# Patient Record
Sex: Female | Born: 1955 | Race: White | Hispanic: No | Smoking: Current every day smoker
Health system: Southern US, Community
[De-identification: ages and names within clinical notes are randomized; demographics above are authoritative.]

## PROBLEM LIST (undated history)

## (undated) DIAGNOSIS — J449 Chronic obstructive pulmonary disease, unspecified: Secondary | ICD-10-CM

## (undated) DIAGNOSIS — J439 Emphysema, unspecified: Secondary | ICD-10-CM

## (undated) DIAGNOSIS — M797 Fibromyalgia: Secondary | ICD-10-CM

## (undated) DIAGNOSIS — F329 Major depressive disorder, single episode, unspecified: Secondary | ICD-10-CM

## (undated) DIAGNOSIS — R509 Fever, unspecified: Secondary | ICD-10-CM

## (undated) DIAGNOSIS — I639 Cerebral infarction, unspecified: Secondary | ICD-10-CM

## (undated) DIAGNOSIS — E119 Type 2 diabetes mellitus without complications: Secondary | ICD-10-CM

## (undated) DIAGNOSIS — R011 Cardiac murmur, unspecified: Secondary | ICD-10-CM

## (undated) DIAGNOSIS — F32A Depression, unspecified: Secondary | ICD-10-CM

## (undated) DIAGNOSIS — M791 Myalgia, unspecified site: Secondary | ICD-10-CM

## (undated) DIAGNOSIS — I10 Essential (primary) hypertension: Secondary | ICD-10-CM

## (undated) DIAGNOSIS — E782 Mixed hyperlipidemia: Secondary | ICD-10-CM

## (undated) DIAGNOSIS — I350 Nonrheumatic aortic (valve) stenosis: Secondary | ICD-10-CM

## (undated) DIAGNOSIS — J45909 Unspecified asthma, uncomplicated: Secondary | ICD-10-CM

## (undated) DIAGNOSIS — E039 Hypothyroidism, unspecified: Secondary | ICD-10-CM

## (undated) DIAGNOSIS — R609 Edema, unspecified: Secondary | ICD-10-CM

## (undated) DIAGNOSIS — R5383 Other fatigue: Secondary | ICD-10-CM

## (undated) DIAGNOSIS — R42 Dizziness and giddiness: Secondary | ICD-10-CM

## (undated) DIAGNOSIS — F419 Anxiety disorder, unspecified: Secondary | ICD-10-CM

## (undated) DIAGNOSIS — I2510 Atherosclerotic heart disease of native coronary artery without angina pectoris: Secondary | ICD-10-CM

## (undated) DIAGNOSIS — R21 Rash and other nonspecific skin eruption: Secondary | ICD-10-CM

## (undated) DIAGNOSIS — R079 Chest pain, unspecified: Secondary | ICD-10-CM

## (undated) HISTORY — DX: Cardiac murmur, unspecified: R01.1

## (undated) HISTORY — DX: Mixed hyperlipidemia: E78.2

## (undated) HISTORY — DX: Major depressive disorder, single episode, unspecified: F32.9

## (undated) HISTORY — DX: Myalgia, unspecified site: M79.10

## (undated) HISTORY — DX: Anxiety disorder, unspecified: F41.9

## (undated) HISTORY — DX: Rash and other nonspecific skin eruption: R21

## (undated) HISTORY — DX: Type 2 diabetes mellitus without complications: E11.9

## (undated) HISTORY — DX: Depression, unspecified: F32.A

## (undated) HISTORY — DX: Chest pain, unspecified: R07.9

## (undated) HISTORY — DX: Cerebral infarction, unspecified: I63.9

## (undated) HISTORY — DX: Dizziness and giddiness: R42

## (undated) HISTORY — DX: Essential (primary) hypertension: I10

## (undated) HISTORY — DX: Atherosclerotic heart disease of native coronary artery without angina pectoris: I25.10

## (undated) HISTORY — DX: Fibromyalgia: M79.7

## (undated) HISTORY — DX: Emphysema, unspecified: J43.9

## (undated) HISTORY — DX: Nonrheumatic aortic (valve) stenosis: I35.0

## (undated) HISTORY — DX: Fever, unspecified: R50.9

## (undated) HISTORY — DX: Unspecified asthma, uncomplicated: J45.909

## (undated) HISTORY — DX: Chronic obstructive pulmonary disease, unspecified: J44.9

## (undated) HISTORY — DX: Other fatigue: R53.83

## (undated) HISTORY — DX: Edema, unspecified: R60.9

## (undated) HISTORY — PX: CHOLECYSTECTOMY: SHX55

---

## 1998-11-24 ENCOUNTER — Encounter: Payer: Self-pay | Admitting: Emergency Medicine

## 1998-11-24 ENCOUNTER — Emergency Department (HOSPITAL_COMMUNITY): Admission: EM | Admit: 1998-11-24 | Discharge: 1998-11-24 | Payer: Self-pay | Admitting: Emergency Medicine

## 1998-12-11 ENCOUNTER — Encounter (HOSPITAL_BASED_OUTPATIENT_CLINIC_OR_DEPARTMENT_OTHER): Payer: Self-pay | Admitting: General Surgery

## 1998-12-11 ENCOUNTER — Inpatient Hospital Stay (HOSPITAL_COMMUNITY): Admission: RE | Admit: 1998-12-11 | Discharge: 1998-12-12 | Payer: Self-pay | Admitting: General Surgery

## 1999-04-05 ENCOUNTER — Other Ambulatory Visit: Admission: RE | Admit: 1999-04-05 | Discharge: 1999-04-05 | Payer: Self-pay | Admitting: *Deleted

## 1999-04-05 ENCOUNTER — Encounter: Admission: RE | Admit: 1999-04-05 | Discharge: 1999-04-05 | Payer: Self-pay | Admitting: Obstetrics

## 2000-04-10 ENCOUNTER — Other Ambulatory Visit: Admission: RE | Admit: 2000-04-10 | Discharge: 2000-04-10 | Payer: Self-pay | Admitting: *Deleted

## 2003-12-28 ENCOUNTER — Encounter: Admission: RE | Admit: 2003-12-28 | Discharge: 2003-12-28 | Payer: Self-pay | Admitting: Internal Medicine

## 2004-11-05 ENCOUNTER — Emergency Department (HOSPITAL_COMMUNITY): Admission: EM | Admit: 2004-11-05 | Discharge: 2004-11-05 | Payer: Self-pay | Admitting: Emergency Medicine

## 2005-04-24 ENCOUNTER — Encounter: Admission: RE | Admit: 2005-04-24 | Discharge: 2005-04-24 | Payer: Self-pay | Admitting: Family Medicine

## 2010-01-21 DIAGNOSIS — G4730 Sleep apnea, unspecified: Secondary | ICD-10-CM

## 2010-01-21 HISTORY — DX: Sleep apnea, unspecified: G47.30

## 2010-02-10 ENCOUNTER — Encounter: Payer: Self-pay | Admitting: Family Medicine

## 2010-02-11 ENCOUNTER — Encounter: Payer: Self-pay | Admitting: Internal Medicine

## 2010-06-25 ENCOUNTER — Other Ambulatory Visit: Payer: Self-pay | Admitting: Orthopaedic Surgery

## 2010-06-25 DIAGNOSIS — M48061 Spinal stenosis, lumbar region without neurogenic claudication: Secondary | ICD-10-CM

## 2010-07-02 ENCOUNTER — Other Ambulatory Visit: Payer: Self-pay

## 2013-06-10 ENCOUNTER — Ambulatory Visit (INDEPENDENT_AMBULATORY_CARE_PROVIDER_SITE_OTHER): Payer: Medicare Other

## 2013-06-10 VITALS — BP 153/75 | HR 65 | Resp 18

## 2013-06-10 DIAGNOSIS — M6788 Other specified disorders of synovium and tendon, other site: Secondary | ICD-10-CM

## 2013-06-10 DIAGNOSIS — M722 Plantar fascial fibromatosis: Secondary | ICD-10-CM

## 2013-06-10 DIAGNOSIS — M7730 Calcaneal spur, unspecified foot: Secondary | ICD-10-CM

## 2013-06-10 DIAGNOSIS — G5790 Unspecified mononeuropathy of unspecified lower limb: Secondary | ICD-10-CM

## 2013-06-10 DIAGNOSIS — M7660 Achilles tendinitis, unspecified leg: Secondary | ICD-10-CM

## 2013-06-10 NOTE — Progress Notes (Signed)
   Subjective:    Patient ID: Kristina Montgomery, female    DOB: 1955/04/21, 58 y.o.   MRN: [id number]  HPI my right heel has been hurting for about two months and burns and hurts on the bottom and feels like a stone bruise and throbs and sharp pains and hurts first thing in the morning    Review of Systems  Constitutional: Positive for fever, chills, activity change, appetite change and fatigue.  HENT:       Ringing in ears  Eyes: Positive for visual disturbance.  Respiratory: Positive for cough, chest tightness and wheezing.   Gastrointestinal: Positive for nausea, abdominal pain and diarrhea.  Genitourinary: Positive for urgency and difficulty urinating.  Musculoskeletal: Positive for back pain and gait problem.  Skin: Positive for rash.       Rash on foot  Neurological: Positive for seizures, weakness and numbness.  Psychiatric/Behavioral: The patient is nervous/anxious.   All other systems reviewed and are negative.      Objective:   Physical Exam Lower extremity objective findings as follows vascular status reveals pedal pulses palpable DP postal for PT plus one over 4 bilateral capillary refill time 3 seconds all digits skin temperature warm turgor normal no edema rubor pallor noted mild varicosities noted bilateral neurologically epicritic and proprioceptive sensations intact and symmetric bilateral patient is having some burning paresthesias his meals and and shooting sensations in the inferior heel and arch area heel heel and posterior heel on the right patient also has had lumbar radiculopathy and back surgeries in the past. Is on pain management medications for chronic back problems. The slight fissuring some symptomology and neuritis neuralgia to the inferior and posterior heel most significant pain is the posterior aspect of the heel on direct palpation the retrocalcaneal tubercle x-rays confirm large posterior calcaneal spur thickening Achilles tendon mild thickening plantar  fascial structures well to some tenderness the fascia although not as severe. Orthopedic exam otherwise unremarkable noncontributory. There no open wounds ulcerations dermatologically otherwise unremarkable       Assessment & Plan:  Assessment this time is retrocalcaneal spurring with Achilles tendinosis versus bursitis the Achilles tendon and tendinitis there is some abnormality gait patient also has posse some lumbar radiculopathy with neuritis or neuralgia effecting her heel in right foot. Patient is RE taking multiple medications for nerve and pain and not feeling additional pain medication is necessary this time recommended therapy including warm compress ice pack to maintain hot and cold therapy to the posterior heel area multiple episodes daily as instructed also this time dispensed instructed patient to use of an air fracture walker boot to immobilize the Achilles tendon heel and ankle area continued boot for at least 4 weeks reappointed that time for followup and reevaluation if no significant root may consider alternative treatments possible physical therapy is noninvasive further studies may be considered or invasive options as a last resort to  Alvan Dameichard Dequane Strahan DPM

## 2013-06-10 NOTE — Patient Instructions (Signed)
ICE INSTRUCTIONS  Apply ice or cold pack to the affected area at least 3 times a day for 10-15 minutes each time.  You should also use ice after prolonged activity or vigorous exercise.  Do not apply ice longer than 20 minutes at one time.  Always keep a cloth between your skin and the ice pack to prevent burns.  Being consistent and following these instructions will help control your symptoms.  We suggest you purchase a gel ice pack because they are reusable and do bit leak.  Some of them are designed to wrap around the area.  Use the method that works best for you.  Here are some other suggestions for icing.   Use a frozen bag of peas or corn-inexpensive and molds well to your body, usually stays frozen for 10 to 20 minutes.  Wet a towel with cold water and squeeze out the excess until it's damp.  Place in a bag in the freezer for 20 minutes. Then remove and use.  Alternate applying hot compresses such as a hot or warm moist towel for 10 minutes then ice pack for 10 minutes repeat this 2 or 3 times every day applying heat ice heat and ice multiple times in each sitting. Next  Maintain air fracture boot at all times except when bathing or sleeping

## 2013-07-08 ENCOUNTER — Ambulatory Visit: Payer: Medicare Other

## 2015-03-21 DIAGNOSIS — E049 Nontoxic goiter, unspecified: Secondary | ICD-10-CM | POA: Insufficient documentation

## 2016-01-22 DIAGNOSIS — E0590 Thyrotoxicosis, unspecified without thyrotoxic crisis or storm: Secondary | ICD-10-CM

## 2016-01-22 HISTORY — DX: Thyrotoxicosis, unspecified without thyrotoxic crisis or storm: E05.90

## 2017-06-23 ENCOUNTER — Ambulatory Visit (INDEPENDENT_AMBULATORY_CARE_PROVIDER_SITE_OTHER): Payer: Medicare Other | Admitting: Podiatry

## 2017-06-23 ENCOUNTER — Encounter: Payer: Self-pay | Admitting: Podiatry

## 2017-06-23 ENCOUNTER — Other Ambulatory Visit: Payer: Self-pay | Admitting: *Deleted

## 2017-06-23 VITALS — BP 114/50 | HR 68

## 2017-06-23 DIAGNOSIS — B351 Tinea unguium: Secondary | ICD-10-CM | POA: Diagnosis not present

## 2017-06-23 DIAGNOSIS — E1142 Type 2 diabetes mellitus with diabetic polyneuropathy: Secondary | ICD-10-CM

## 2017-07-20 NOTE — Progress Notes (Signed)
Subjective:  Patient ID: Kristina MoatsBrenda W Clinkenbeard, female    DOB: 1955/06/30,  MRN: [id number]  Chief Complaint  Patient presents with  . Nail Problem    debridement,unable to trim  Reports diabetes 62 y.o. female presents with the above complaint.  With monitoring Dr. sugar level.  Last A1c of 13.  Reports her diabetes out-of-control reports history of ingrown's in the past and states her nails are very painful. Past Medical History:  Diagnosis Date  . Anxiety   . Asthma   . Chest pain   . Depression   . Diabetes mellitus without complication (HCC)   . Dizziness   . Emphysema of lung (HCC)   . Fatigue   . Fever   . Hypertension   . Muscle pain   . Rash    foot  . Swelling    Past Surgical History:  Procedure Laterality Date  . CHOLECYSTECTOMY      Current Outpatient Medications:  .  Aclidinium Bromide (TUDORZA PRESSAIR) 400 MCG/ACT AEPB, Inhale into the lungs., Disp: , Rfl:  .  albuterol (PROVENTIL HFA;VENTOLIN HFA) 108 (90 BASE) MCG/ACT inhaler, Inhale into the lungs every 6 (six) hours as needed for wheezing or shortness of breath., Disp: , Rfl:  .  Alogliptin-Pioglitazone (OSENI PO), Take 25 mg by mouth., Disp: , Rfl:  .  ALPRAZolam (XANAX) 1 MG tablet, Take 1 mg by mouth at bedtime as needed for anxiety., Disp: , Rfl:  .  amLODipine-olmesartan (AZOR) 10-40 MG per tablet, Take 1 tablet by mouth daily., Disp: , Rfl:  .  ARIPiprazole (ABILIFY) 10 MG tablet, TK 1 T PO  D, Disp: , Rfl: 0 .  carisoprodol (SOMA) 350 MG tablet, Take 350 mg by mouth 4 (four) times daily as needed for muscle spasms., Disp: , Rfl:  .  doxepin (SINEQUAN) 100 MG capsule, TK 2 CS PO QHS, Disp: , Rfl: 2 .  DULoxetine (CYMBALTA) 30 MG capsule, TK ONE C PO D, Disp: , Rfl: 2 .  estradiol (ESTRACE) 0.5 MG tablet, Take 0.5 mg by mouth daily., Disp: , Rfl:  .  FLUoxetine (PROZAC) 20 MG tablet, Take 20 mg by mouth daily., Disp: , Rfl:  .  methimazole (TAPAZOLE) 5 MG tablet, TK 1 T PO D, Disp: , Rfl: 0 .  nebivolol  (BYSTOLIC) 10 MG tablet, Take 10 mg by mouth daily., Disp: , Rfl:  .  OxyCODONE HCl ER (OXYCONTIN) 30 MG T12A, Take by mouth., Disp: , Rfl:  .  oxyCODONE-acetaminophen (PERCOCET) 10-325 MG per tablet, Take 1 tablet by mouth every 4 (four) hours as needed for pain. Patient states that it is the 10 mg/lc, Disp: , Rfl:  .  topiramate (TOPAMAX) 50 MG tablet, Take 50 mg by mouth 2 (two) times daily., Disp: , Rfl:   No Known Allergies Review of Systems: Negative except as noted in the HPI. Denies N/V/F/Ch. Objective:   General AA&O x3. Normal mood and affect.  Vascular Dorsalis pedis pulses present 1+ bilaterally  Posterior tibial pulses 1+ bilaterally  Capillary refill normal to all digits. Pedal hair growth normal.  Neurologic Epicritic sensation present bilaterally. Protective sensation with 5.07 monofilament  Absent bilaterally. Vibratory sensation present bilaterally.  Dermatologic No open lesions. Interspaces clear of maceration.  Normal skin temperature and turgor. Hyperkeratotic lesions: None bilaterally. Nails: brittle, onychomycosis, thickening, elongation  Orthopedic: No history of amputation. MMT 5/5 in dorsiflexion, plantarflexion, inversion, and eversion. Normal lower extremity joint ROM without pain or crepitus.    Assessment & Plan:  Patient was evaluated and treated and all questions answered.  Diabetes with Neuropathy, Onychomycosis -Educated on diabetic footcare. Diabetic risk level 1 -Nails x10 debrided sharply and manually with large nail nipper and rotary burr.  Return in about 3 months (around 09/23/2017) for Diabetic Foot Care.

## 2017-08-25 ENCOUNTER — Ambulatory Visit: Payer: Medicare Other | Admitting: Podiatry

## 2020-04-28 ENCOUNTER — Other Ambulatory Visit: Payer: Self-pay | Admitting: *Deleted

## 2020-04-28 DIAGNOSIS — I350 Nonrheumatic aortic (valve) stenosis: Secondary | ICD-10-CM

## 2020-05-01 ENCOUNTER — Encounter: Payer: Self-pay | Admitting: Thoracic Surgery (Cardiothoracic Vascular Surgery)

## 2020-05-03 ENCOUNTER — Other Ambulatory Visit: Payer: Self-pay

## 2020-05-03 ENCOUNTER — Ambulatory Visit (HOSPITAL_COMMUNITY)
Admission: RE | Admit: 2020-05-03 | Discharge: 2020-05-03 | Disposition: A | Discharge status: Home / Self Care | Payer: Medicare Other | Source: Ambulatory Visit | Attending: Internal Medicine | Admitting: Internal Medicine

## 2020-05-03 DIAGNOSIS — I350 Nonrheumatic aortic (valve) stenosis: Secondary | ICD-10-CM | POA: Diagnosis present

## 2020-05-03 MED ORDER — PERFLUTREN LIPID MICROSPHERE
1.0000 mL | INTRAVENOUS | Status: AC | PRN
  Administered 2020-05-03: 3 mL via INTRAVENOUS
  Filled 2020-05-03: qty 10

## 2020-05-03 NOTE — Progress Notes (Signed)
  Echocardiogram 2D Echocardiogram has been performed.  Tye Savoy 05/03/2020, 10:48 AM

## 2020-05-05 LAB — ECHOCARDIOGRAM COMPLETE
AR max vel: 0.98 cm2
AV Area VTI: 1.07 cm2
AV Area mean vel: 0.97 cm2
AV Mean grad: 40 mmHg
AV Peak grad: 64.3 mmHg
Ao pk vel: 4.01 m/s
Area-P 1/2: 9.37 cm2
Calc EF: 49.1 %
MV VTI: 2.56 cm2
S' Lateral: 3.6 cm
Single Plane A2C EF: 44.4 %
Single Plane A4C EF: 52.4 %

## 2020-05-15 ENCOUNTER — Ambulatory Visit (INDEPENDENT_AMBULATORY_CARE_PROVIDER_SITE_OTHER): Payer: Medicare Other | Admitting: Internal Medicine

## 2020-05-15 ENCOUNTER — Encounter: Payer: Self-pay | Admitting: Internal Medicine

## 2020-05-15 ENCOUNTER — Other Ambulatory Visit: Payer: Self-pay

## 2020-05-15 VITALS — BP 124/72 | HR 113 | Ht 65.0 in | Wt 175.2 lb

## 2020-05-15 DIAGNOSIS — I10 Essential (primary) hypertension: Secondary | ICD-10-CM

## 2020-05-15 DIAGNOSIS — I350 Nonrheumatic aortic (valve) stenosis: Secondary | ICD-10-CM

## 2020-05-15 DIAGNOSIS — I4510 Unspecified right bundle-branch block: Secondary | ICD-10-CM | POA: Diagnosis not present

## 2020-05-15 DIAGNOSIS — E785 Hyperlipidemia, unspecified: Secondary | ICD-10-CM

## 2020-05-15 NOTE — Progress Notes (Deleted)
Cardiology Office Note:    Date:  05/15/2020   ID:  Kristina, Montgomery 1955/10/08, MRN [id number]  PCP:  Galvin Proffer, MD  Cardiologist:  No primary care provider on file.  Electrophysiologist:  None   Referring MD: Galvin Proffer, MD   Chief Complaint/Reason for Referral: Severe aortic valve stenosis  History of Present Illness:    Kristina Montgomery is a 65 y.o. female with a history of diabetes type 2, hyperlipidemia, COPD, hypertension, active smoking.  Presents for further evaluation of severe aortic valve stenosis noted on an outside echocardiogram, confirmed with echocardiogram at our facility.  She is an active smoker, smoking two packs a day for 45 years.Today she reports that she is feeling weak and that she has no energy. She is accompanied by her son, Leonette Most who provides collaborative history.   She reports having exertional chest pressure, tightness, SOB, and lightheadedness. She reports that she usually has trouble walking to different rooms in her house due to the symptoms. When she begins to walk, she loses energy and her chest tightness and lightheadedness begins. It tends to last for a couple minutes once it starts and then it resolves with rest.   She was referred for concern of severe aortic valve stenosis, we discussed in detail the natural history of aortic valve stenosis, mortality associated with symptomatic aortic valve stenosis, and indications for aortic valve replacement both surgical and transcatheter.  Denies PND, orthopnea, leg swelling.  Denies lifetime syncope.  Denies cough, fever, chills, nausea, vomiting, diarrhea.  Past Medical History:  Diagnosis Date  . Anxiety   . Asthma   . Chest pain   . COPD (chronic obstructive pulmonary disease) (HCC)   . Coronary artery disease   . Depression   . Diabetes mellitus without complication (HCC)   . Dizziness   . Emphysema of lung (HCC)   . Fatigue   . Fever   . Fibromyalgia   . Heart murmur   .  Hypertension   . Mixed hyperlipidemia   . Muscle pain   . Rash    foot  . Swelling     Past Surgical History:  Procedure Laterality Date  . CHOLECYSTECTOMY      Current Medications: Current Meds  Medication Sig  . Aclidinium Bromide 400 MCG/ACT AEPB Inhale into the lungs.  Marland Kitchen albuterol (PROVENTIL HFA;VENTOLIN HFA) 108 (90 BASE) MCG/ACT inhaler Inhale into the lungs every 6 (six) hours as needed for wheezing or shortness of breath.  . Alogliptin-Pioglitazone (OSENI PO) Take 25 mg by mouth.  . ALPRAZolam (XANAX) 1 MG tablet Take 1 mg by mouth at bedtime as needed for anxiety.  Marland Kitchen amLODipine-olmesartan (AZOR) 10-40 MG tablet Take 1 tablet by mouth daily.  . ARIPiprazole (ABILIFY) 10 MG tablet TK 1 T PO  D  . carisoprodol (SOMA) 350 MG tablet Take 350 mg by mouth 4 (four) times daily as needed for muscle spasms.  Marland Kitchen doxepin (SINEQUAN) 100 MG capsule TK 2 CS PO QHS  . DULoxetine (CYMBALTA) 30 MG capsule TK ONE C PO D  . estradiol (ESTRACE) 0.5 MG tablet Take 0.5 mg by mouth daily.  Marland Kitchen FLUoxetine (PROZAC) 20 MG tablet Take 20 mg by mouth daily.  . methimazole (TAPAZOLE) 5 MG tablet TK 1 T PO D  . nebivolol (BYSTOLIC) 10 MG tablet Take 10 mg by mouth daily.  Marland Kitchen oxyCODONE 30 MG 12 hr tablet Take by mouth.  . oxyCODONE-acetaminophen (PERCOCET) 10-325 MG per tablet Take 1  tablet by mouth every 4 (four) hours as needed for pain. Patient states that it is the 10 mg/lc  . topiramate (TOPAMAX) 50 MG tablet Take 50 mg by mouth 2 (two) times daily.     Allergies:   Patient has no known allergies.   Social History   Tobacco Use  . Smoking status: Current Every Day Smoker  . Smokeless tobacco: Never Used  Substance Use Topics  . Alcohol use: No  . Drug use: No     Family History: The patient's family history includes Cancer in her father; Heart disease in her mother.  ROS:   Please see the history of present illness.    All other systems reviewed and are negative.  EKGs/Labs/Other  Studies Reviewed:    The following studies were reviewed today:  EKG:  05/15/2020- sinus tachycardia, right bundle branch block, left ventricle hypertrophy  Recent Labs: No results found for requested labs within last 8760 hours.  Recent Lipid Panel No results found for: CHOL, TRIG, HDL, CHOLHDL, VLDL, LDLCALC, LDLDIRECT  Physical Exam:    VS:  BP 124/72   Pulse (!) 113   Ht 5\' 5"  (1.651 m)   Wt 175 lb 3.2 oz (79.5 kg)   SpO2 95%   BMI 29.15 kg/m     Wt Readings from Last 5 Encounters:  05/15/20 175 lb 3.2 oz (79.5 kg)    Constitutional: No acute distress Eyes: sclera non-icteric, normal conjunctiva and lids ENMT: normal dentition, moist mucous membranes Cardiovascular:  regular rhythm, tachycardic rate, Systolic ejection 3/6 late peaking obscures S2, S1 normal. Radial pulses normal bilaterally. No jugular venous distention.  Respiratory: clear to auscultation bilaterally GI : normal bowel sounds, soft and nontender. No distention.   MSK: extremities warm, well perfused. No edema.  NEURO: grossly nonfocal exam, moves all extremities. PSYCH: alert and oriented x 3, normal mood and affect.   ASSESSMENT:    1. Severe aortic stenosis   2. Primary hypertension   3. Hyperlipidemia, unspecified hyperlipidemia type   4. Right bundle branch block    PLAN:    Severe aortic stenosis - Plan: EKG 12-Lead, Ambulatory referral to Structural Heart/Valve Clinic (only at [facility])  Stage D symptomatic severe aortic valve stenosis.  Patient has a mean gradient of 40 mmHg on echo with a V-max of 4.01 m/s, and a calculated valve area of 1.07 cm.  With her symptoms I suspect this represents severe aortic valve stenosis.  We have discussed the natural history of severe aortic valve stenosis as well as options for treatment with symptoms.  Will refer to the structural heart team for further work-up which will include an investigation of her coronary arteries given risk factors of ongoing  tobacco use, diabetes, hypertension, hyperlipidemia.  I unfortunately do not have laboratory studies for her lipids or diabetes, we will obtain these in the process of her work-up.  Primary hypertension- blood pressure well controlled today on amlodipine olmesartan combination tab, and Bystolic.  Continue at this time.  She is with sinus tachycardia, unclear etiology.  Hyperlipidemia, unspecified hyperlipidemia type- we will need lipids as part of her work-up to continue to risk stratify.  Right bundle branch block-to be noted as part of her TAVR work-up.  No syncope though she does experience some lightheadedness, I suspect this is more related to the valve then conduction delay.  Total time of encounter: 60 minutes total time of encounter, including 38 minutes spent in face-to-face patient care on the date of this  encounter. This time includes coordination of care and counseling regarding above mentioned problem list. Remainder of non-face-to-face time involved reviewing chart documents/testing relevant to the patient encounter and documentation in the medical record. I have independently reviewed documentation from referring provider.   Weston Brass, MD, [facility]  [facility]   Medication Adjustments/Labs and Tests Ordered: Current medicines are reviewed at length with the patient today.  Concerns regarding medicines are outlined above.   Orders Placed This Encounter  Procedures  . Ambulatory referral to Structural Heart/Valve Clinic (only at [facility])  . EKG 12-Lead    No orders of the defined types were placed in this encounter.   Patient Instructions  Medication Instructions:  No Changes In Medications at this time.  *If you need a refill on your cardiac medications before your next appointment, please call your pharmacy*  Follow-Up: At [facility], you and your health needs are our priority.  As part of our continuing mission to provide you with exceptional  heart care, we have created designated Provider Care Teams.  These Care Teams include your primary Cardiologist (physician) and Advanced Practice Providers (APPs -  Physician Assistants and Nurse Practitioners) who all work together to provide you with the care you need, when you need it.  We recommend signing up for the patient portal called "MyChart".  Sign up information is provided on this After Visit Summary.  MyChart is used to connect with patients for Virtual Visits (Telemedicine).  Patients are able to view lab/test results, encounter notes, upcoming appointments, etc.  Non-urgent messages can be sent to your provider as well.   To learn more about what you can do with MyChart, go to [email or web address].    Your next appointment:   To Be Determined  The format for your next appointment:   In Person  Provider:   Weston Brass, MD  Other Instructions REFERRAL TO STRUCTURAL HEART VALVE CLINIC- SOMEONE WILL REACH OUT TO YOU TO SCHEDULE THIS.      Danelle Earthly Moorehead,acting as a Neurosurgeon for Parke Poisson, MD.,have documented all relevant documentation on the behalf of Parke Poisson, MD,as directed by  Parke Poisson, MD while in the presence of Parke Poisson, MD.  I, Parke Poisson, MD, have reviewed all documentation for this visit. The documentation on 05/15/20 for the exam, diagnosis, procedures, and orders are all accurate and complete.

## 2020-05-15 NOTE — Patient Instructions (Signed)
Medication Instructions:  No Changes In Medications at this time.  *If you need a refill on your cardiac medications before your next appointment, please call your pharmacy*  Follow-Up: At [facility], you and your health needs are our priority.  As part of our continuing mission to provide you with exceptional heart care, we have created designated Provider Care Teams.  These Care Teams include your primary Cardiologist (physician) and Advanced Practice Providers (APPs -  Physician Assistants and Nurse Practitioners) who all work together to provide you with the care you need, when you need it.  We recommend signing up for the patient portal called "MyChart".  Sign up information is provided on this After Visit Summary.  MyChart is used to connect with patients for Virtual Visits (Telemedicine).  Patients are able to view lab/test results, encounter notes, upcoming appointments, etc.  Non-urgent messages can be sent to your provider as well.   To learn more about what you can do with MyChart, go to [email or web address].    Your next appointment:   To Be Determined  The format for your next appointment:   In Person  Provider:   Weston Brass, MD  Other Instructions REFERRAL TO STRUCTURAL HEART VALVE CLINIC- SOMEONE WILL REACH OUT TO YOU TO SCHEDULE THIS.

## 2020-05-15 NOTE — Progress Notes (Deleted)
Cardiology Office Note:    Date:  05/15/2020   ID:  Garielle, Mroz 1955/07/31, MRN [id number]  PCP:  Galvin Proffer, MD  Cardiologist:  No primary care provider on file.  Electrophysiologist:  None   Referring MD: Galvin Proffer, MD   Chief Complaint/Reason for Referral: ***  History of Present Illness:    Kristina Montgomery is a 65 y.o. female with a history of ***  Past Medical History:  Diagnosis Date  . Anxiety   . Asthma   . Chest pain   . Depression   . Diabetes mellitus without complication (HCC)   . Dizziness   . Emphysema of lung (HCC)   . Fatigue   . Fever   . Hypertension   . Muscle pain   . Rash    foot  . Swelling     Past Surgical History:  Procedure Laterality Date  . CHOLECYSTECTOMY      Current Medications: Current Meds  Medication Sig  . Aclidinium Bromide 400 MCG/ACT AEPB Inhale into the lungs.  Marland Kitchen albuterol (PROVENTIL HFA;VENTOLIN HFA) 108 (90 BASE) MCG/ACT inhaler Inhale into the lungs every 6 (six) hours as needed for wheezing or shortness of breath.  . Alogliptin-Pioglitazone (OSENI PO) Take 25 mg by mouth.  . ALPRAZolam (XANAX) 1 MG tablet Take 1 mg by mouth at bedtime as needed for anxiety.  Marland Kitchen amLODipine-olmesartan (AZOR) 10-40 MG tablet Take 1 tablet by mouth daily.  . ARIPiprazole (ABILIFY) 10 MG tablet TK 1 T PO  D  . carisoprodol (SOMA) 350 MG tablet Take 350 mg by mouth 4 (four) times daily as needed for muscle spasms.  Marland Kitchen doxepin (SINEQUAN) 100 MG capsule TK 2 CS PO QHS  . DULoxetine (CYMBALTA) 30 MG capsule TK ONE C PO D  . estradiol (ESTRACE) 0.5 MG tablet Take 0.5 mg by mouth daily.  Marland Kitchen FLUoxetine (PROZAC) 20 MG tablet Take 20 mg by mouth daily.  . methimazole (TAPAZOLE) 5 MG tablet TK 1 T PO D  . nebivolol (BYSTOLIC) 10 MG tablet Take 10 mg by mouth daily.  Marland Kitchen oxyCODONE 30 MG 12 hr tablet Take by mouth.  . oxyCODONE-acetaminophen (PERCOCET) 10-325 MG per tablet Take 1 tablet by mouth every 4 (four) hours as needed for pain.  Patient states that it is the 10 mg/lc  . topiramate (TOPAMAX) 50 MG tablet Take 50 mg by mouth 2 (two) times daily.     Allergies:   Patient has no known allergies.   Social History   Tobacco Use  . Smoking status: Current Every Day Smoker  . Smokeless tobacco: Never Used  Substance Use Topics  . Alcohol use: No  . Drug use: No     Family History: The patient's family history is not on file.  ROS:   Please see the history of present illness.    All other systems reviewed and are negative.  EKGs/Labs/Other Studies Reviewed:    The following studies were reviewed today:  EKG:  ***  I have independently reviewed the images from ***.  Recent Labs: No results found for requested labs within last 8760 hours.  Recent Lipid Panel No results found for: CHOL, TRIG, HDL, CHOLHDL, VLDL, LDLCALC, LDLDIRECT  Physical Exam:    VS:  BP 124/72   Pulse (!) 113   Ht 5\' 5"  (1.651 m)   Wt 175 lb 3.2 oz (79.5 kg)   SpO2 95%   BMI 29.15 kg/m     Wt Readings  from Last 5 Encounters:  05/15/20 175 lb 3.2 oz (79.5 kg)    Constitutional: No acute distress Eyes: sclera non-icteric, normal conjunctiva and lids ENMT: normal dentition, moist mucous membranes Cardiovascular: regular rhythm, normal rate, no murmurs. S1 and S2 normal. Radial pulses normal bilaterally. No jugular venous distention.  Respiratory: clear to auscultation bilaterally GI : normal bowel sounds, soft and nontender. No distention.   MSK: extremities warm, well perfused. No edema.  NEURO: grossly nonfocal exam, moves all extremities. PSYCH: alert and oriented x 3, normal mood and affect.   ASSESSMENT:    No diagnosis found. PLAN:    No diagnosis found.  Total time of encounter: *** minutes total time of encounter, including *** minutes spent in face-to-face patient care on the date of this encounter. This time includes coordination of care and counseling regarding above mentioned problem list. Remainder of  non-face-to-face time involved reviewing chart documents/testing relevant to the patient encounter and documentation in the medical record. I have independently reviewed documentation from referring provider.   Weston Brass, MD, [facility] [place]  [facility]   Shared Decision Making/Informed Consent:   {Are you ordering a CV Procedure (e.g. stress test, cath, DCCV, TEE, etc)?   Press F2        :[id number]}   Medication Adjustments/Labs and Tests Ordered: Current medicines are reviewed at length with the patient today.  Concerns regarding medicines are outlined above.   No orders of the defined types were placed in this encounter.   No orders of the defined types were placed in this encounter.   There are no Patient Instructions on file for this visit.   @provider @

## 2020-05-23 ENCOUNTER — Ambulatory Visit (INDEPENDENT_AMBULATORY_CARE_PROVIDER_SITE_OTHER): Payer: Medicare Other | Admitting: Cardiovascular Disease

## 2020-05-23 ENCOUNTER — Other Ambulatory Visit: Payer: Self-pay

## 2020-05-23 ENCOUNTER — Encounter: Payer: Self-pay | Admitting: Cardiovascular Disease

## 2020-05-23 VITALS — BP 120/70 | HR 109 | Ht 65.0 in | Wt 188.0 lb

## 2020-05-23 DIAGNOSIS — Z01812 Encounter for preprocedural laboratory examination: Secondary | ICD-10-CM

## 2020-05-23 DIAGNOSIS — I350 Nonrheumatic aortic (valve) stenosis: Secondary | ICD-10-CM

## 2020-05-23 LAB — CBC
Hematocrit: 45.8 % (ref 34.0–46.6)
Hemoglobin: 15.4 g/dL (ref 11.1–15.9)
MCH: 30.7 pg (ref 26.6–33.0)
MCHC: 33.6 g/dL (ref 31.5–35.7)
MCV: 91 fL (ref 79–97)
Platelets: 302 10*3/uL (ref 150–450)
RBC: 5.02 x10E6/uL (ref 3.77–5.28)
RDW: 13.1 % (ref 11.7–15.4)
WBC: 14.6 10*3/uL — ABNORMAL HIGH (ref 3.4–10.8)

## 2020-05-23 LAB — BASIC METABOLIC PANEL
BUN/Creatinine Ratio: 18 (ref 12–28)
BUN: 17 mg/dL (ref 8–27)
CO2: 26 mmol/L (ref 20–29)
Calcium: 9.4 mg/dL (ref 8.7–10.3)
Chloride: 89 mmol/L — ABNORMAL LOW (ref 96–106)
Creatinine, Ser: 0.92 mg/dL (ref 0.57–1.00)
Glucose: 374 mg/dL — ABNORMAL HIGH (ref 65–99)
Potassium: 5.4 mmol/L — ABNORMAL HIGH (ref 3.5–5.2)
Sodium: 130 mmol/L — ABNORMAL LOW (ref 134–144)
eGFR: 68 mL/min/{1.73_m2} (ref >59)

## 2020-05-23 NOTE — Progress Notes (Signed)
[facility] Consult Note  Chief Complaint  Patient presents with  . New Patient (Initial Visit)    Severe aortic stenosis     History of Present Illness: 65 yo female with history of asthma/COPD, current everyday smoker, diabetes mellitus type 2, hyperlipidemia, HTN, anxiety/depression, fibromyalgia, and severe aortic stenosis who is here today as a new consult, referred by Dr. Jacques Navy, for further discussion regarding her aortic stenosis and possible TAVR. She is an active smoker, smoking 1 ppd for 45 years. She has COPD. She has diabetes and is on oral therapy. She has had progressive fatigue, dyspnea, dizziness and chest pressure. She was seen by Dr. Jacques Navy 05/15/20 for the first time. Echo 05/03/20 with LVEF=60-65%, mild LVH. The aortic valve leaflets are thickened and calcified with limited leaflet excursion. Mean gradient 40 mmHg, peak gradient 64.3 mmHg, AVA 0.98 cm2, dimensionless index 0.31, SVI 44. This is felt to represent severe aortic stenosis.   She tells me today that she has had progressive dyspnea, chest pressure, fatigue and dizziness. No near syncope or syncope. She lives alone in [place], [place]. She has top dentures and no teeth on the bottom. She has been on disability for 20 years due to depression.   Primary Care Physician: Galvin Proffer, MD Primary Cardiologist: Jacques Navy Referring Cardiologist: Jacques Navy  Past Medical History:  Diagnosis Date  . Anxiety   . Asthma   . Chest pain   . COPD (chronic obstructive pulmonary disease) (HCC)   . Coronary artery disease   . Depression   . Diabetes mellitus without complication (HCC)   . Dizziness   . Emphysema of lung (HCC)   . Fatigue   . Fever   . Fibromyalgia   . Heart murmur   . Hypertension   . Mixed hyperlipidemia   . Muscle pain   . Rash    foot  . Swelling     Past Surgical History:  Procedure Laterality Date  . CHOLECYSTECTOMY      Current Outpatient Medications  Medication Sig Dispense  Refill  . Aclidinium Bromide 400 MCG/ACT AEPB Inhale into the lungs.    Marland Kitchen amLODipine-olmesartan (AZOR) 10-40 MG tablet Take 1 tablet by mouth daily.    . ARIPiprazole (ABILIFY) 10 MG tablet Take 10 mg by mouth daily.  0  . doxepin (SINEQUAN) 100 MG capsule Take 100 mg by mouth at bedtime as needed.  2  . nebivolol (BYSTOLIC) 10 MG tablet Take 10 mg by mouth daily.    Marland Kitchen oxyCODONE 30 MG 12 hr tablet Take 1 tablet by mouth as needed.     No current facility-administered medications for this visit.    No Known Allergies  Social History   Socioeconomic History  . Marital status: Widowed    Spouse name: Not on file  . Number of children: 4  . Years of education: Not on file  . Highest education level: Not on file  Occupational History  . Occupation: On disability for 20 years  Tobacco Use  . Smoking status: Current Every Day Smoker    Packs/day: 1.00    Types: Cigarettes  . Smokeless tobacco: Never Used  Substance and Sexual Activity  . Alcohol use: No  . Drug use: No  . Sexual activity: Not on file  Other Topics Concern  . Not on file  Social History Narrative  . Not on file   Social Determinants of Health   Financial Resource Strain: Not on file  Food Insecurity: Not on  file  Transportation Needs: Not on file  Physical Activity: Not on file  Stress: Not on file  Social Connections: Not on file  Intimate Partner Violence: Not on file    Family History  Problem Relation Age of Onset  . Heart disease Mother   . Cancer Father     Review of Systems:  As stated in the HPI and otherwise negative.   BP 120/70   Pulse (!) 109   Ht 5\' 5"  (1.651 m)   Wt 188 lb (85.3 kg)   SpO2 94%   BMI 31.28 kg/m   Physical Examination: General: Well developed, well nourished, NAD  HEENT: OP clear, mucus membranes moist  SKIN: warm, dry. No rashes. Neuro: No focal deficits  Musculoskeletal: Muscle strength 5/5 all ext  Psychiatric: Mood and affect normal  Neck: No JVD, no  carotid bruits, no thyromegaly, no lymphadenopathy.  Lungs:Clear bilaterally, no wheezes, rhonci, crackles Cardiovascular: Regular rate and rhythm. Loud, harsh, late peaking systolic murmur.  Abdomen:Soft. Bowel sounds present. Non-tender.  Extremities: No lower extremity edema. Pulses are 2 + in the bilateral DP/PT.  EKG:  EKG is ordered today. The ekg ordered today demonstrates sinus tachycardia, RBBB, LVH  Echo 05/05/20: 1. Severe aortic valve stenosis.. The aortic valve is abnormal. There is  severe calcifcation of the aortic valve. Aortic valve regurgitation is  trivial. Severe aortic valve stenosis. Aortic valve area, by VTI measures  1.07 cm. Aortic valve mean  gradient measures 40.0 mmHg. Aortic valve Vmax measures 4.01 m/s.  2. Left ventricular ejection fraction, by estimation, is 60 to 65%. The  left ventricle has normal function. The left ventricle has no regional  wall motion abnormalities. There is mild left ventricular hypertrophy.  Indeterminate diastolic filling due to  E-A fusion.  3. Right ventricular systolic function is normal. The right ventricular  size is normal. Tricuspid regurgitation signal is inadequate for assessing  PA pressure.  4. Left atrial size was moderately dilated.  5. The mitral valve is degenerative. Trivial mitral valve regurgitation.  No evidence of mitral stenosis.  6. The inferior vena cava is normal in size with greater than 50%  respiratory variability, suggesting right atrial pressure of 3 mmHg.   FINDINGS  Left Ventricle: Left ventricular ejection fraction, by estimation, is 60  to 65%. The left ventricle has normal function. The left ventricle has no  regional wall motion abnormalities. Definity contrast agent was given IV  to delineate the left ventricular  endocardial borders. Global longitudinal strain performed but not  reported based on interpreter judgement due to suboptimal tracking. The  left ventricular internal  cavity size was normal in size. There is mild  left ventricular hypertrophy. Indeterminate  diastolic filling due to E-A fusion.   Right Ventricle: The right ventricular size is normal. No increase in  right ventricular wall thickness. Right ventricular systolic function is  normal. Tricuspid regurgitation signal is inadequate for assessing PA  pressure.   Left Atrium: Left atrial size was moderately dilated.   Right Atrium: Right atrial size was normal in size.   Pericardium: There is no evidence of pericardial effusion.   Mitral Valve: The mitral valve is degenerative in appearance. Mild mitral  annular calcification. Trivial mitral valve regurgitation. No evidence of  mitral valve stenosis. MV peak gradient, 15.8 mmHg. The mean mitral valve  gradient is 4.7 mmHg with  average heart rate of 90 bpm.   Tricuspid Valve: The tricuspid valve is normal in structure. Tricuspid  valve regurgitation  is not demonstrated. No evidence of tricuspid  stenosis.   Aortic Valve: Severe aortic valve stenosis. The aortic valve is abnormal.  There is severe calcifcation of the aortic valve. Aortic valve  regurgitation is trivial. Severe aortic stenosis is present. Aortic valve  mean gradient measures 40.0 mmHg. Aortic  valve peak gradient measures 64.3 mmHg. Aortic valve area, by VTI measures  1.07 cm.   Pulmonic Valve: The pulmonic valve was normal in structure. Pulmonic valve  regurgitation is trivial. No evidence of pulmonic stenosis.   Aorta: The aortic root is normal in size and structure.   Venous: The inferior vena cava is normal in size with greater than 50%  respiratory variability, suggesting right atrial pressure of 3 mmHg.   IAS/Shunts: No atrial level shunt detected by color flow Doppler.     LEFT VENTRICLE  PLAX 2D  LVIDd:     5.30 cm  LVIDs:     3.60 cm  LV PW:     1.30 cm  LV IVS:    1.40 cm  LVOT diam:   2.10 cm  LV SV:     83  LV SV Index:   44  LVOT Area:   3.46 cm    LV Volumes (MOD)  LV vol d, MOD A2C: 77.5 ml  LV vol d, MOD A4C: 88.3 ml  LV vol s, MOD A2C: 43.1 ml  LV vol s, MOD A4C: 42.0 ml  LV SV MOD A2C:   34.4 ml  LV SV MOD A4C:   88.3 ml  LV SV MOD BP:   41.5 ml   RIGHT VENTRICLE  RV Basal diam: 4.20 cm  TAPSE (M-mode): 2.3 cm   LEFT ATRIUM       Index    RIGHT ATRIUM      Index  LA diam:    4.70 cm 2.48 cm/m RA Area:   15.00 cm  LA Vol (A2C):  78.1 ml 41.29 ml/m RA Volume:  37.40 ml 19.77 ml/m  LA Vol (A4C):  79.4 ml 41.98 ml/m  LA Biplane Vol: 82.2 ml 43.46 ml/m  AORTIC VALVE  AV Area (Vmax):  0.98 cm  AV Area (Vmean):  0.97 cm  AV Area (VTI):   1.07 cm  AV Vmax:      401.00 cm/s  AV Vmean:     283.500 cm/s  AV VTI:      0.771 m  AV Peak Grad:   64.3 mmHg  AV Mean Grad:   40.0 mmHg  LVOT Vmax:     114.00 cm/s  LVOT Vmean:    79.100 cm/s  LVOT VTI:     0.239 m  LVOT/AV VTI ratio: 0.31    AORTA  Ao Root diam: 2.80 cm  Ao Asc diam: 3.00 cm   MITRAL VALVE  MV Area (PHT): 9.37 cm   SHUNTS  MV Area VTI:  2.56 cm   Systemic VTI: 0.24 m  MV Peak grad: 15.8 mmHg  Systemic Diam: 2.10 cm  MV Mean grad: 4.7 mmHg  MV Vmax:    1.99 m/s  MV Vmean:   129.0 cm/s  MV Decel Time: 81 msec  MV E velocity: 118.00 cm/s  MV A velocity: 182.00 cm/s  MV E/A ratio: 0.65   Recent Labs: No results found for requested labs within last 8760 hours.    Wt Readings from Last 3 Encounters:  05/23/20 188 lb (85.3 kg)  05/15/20 175 lb 3.2 oz (79.5 kg)     Other  studies Reviewed: Additional studies/ records that were reviewed today include: Echo images, office notes, EKG Review of the above records demonstrates: severe AS  STS Risk Score: Risk of Mortality: 1.744% Renal Failure: 1.765% Permanent Stroke: 1.719% Prolonged Ventilation: 8.375% DSW Infection: 0.133% Reoperation: 2.846% Morbidity or  Mortality: 11.550% Short Length of Stay: 42.357% Long Length of Stay: 5.150%  Assessment and Plan:   1. Severe Aortic Valve Stenosis: She has severe, stage D aortic valve stenosis. I have personally reviewed the echo images. The aortic valve is thickened, calcified with limited leaflet excursion. I think she would benefit from AVR. She would be a candidate for open surgical AVR or TAVR.    I have reviewed the natural history of aortic stenosis with the patient and their family members  who are present today. We have discussed the limitations of medical therapy and the poor prognosis associated with symptomatic aortic stenosis. We have reviewed potential treatment options, including palliative medical therapy, conventional surgical aortic valve replacement, and transcatheter aortic valve replacement. We discussed treatment options in the context of the patient's specific comorbid medical conditions.   She would like to proceed with planning for TAVR. I will [facility] at 9am. Risks and benefits of the cath procedure and the valve procedure are reviewed with the patient. After the cath, she will have a cardiac CT, CTA of the chest/abdomen and pelvis, carotid artery dopplers, PT assessment and will then be referred to see one of the CT surgeons on our TAVR team.      Current medicines are reviewed at length with the patient today.  The patient does not have concerns regarding medicines.  The following changes have been made:  no change  Labs/ tests ordered today include:   Orders Placed This Encounter  Procedures  . CBC  . Basic metabolic panel     Disposition:   F/U with the valve team    Signed, Verne Carrowhristopher Roxie Kreeger, MD 05/23/2020 11:28 AM    [facility] [address], [place], [place]  [address] Phone: [phone]; Fax: [phone]

## 2020-05-23 NOTE — Patient Instructions (Addendum)
Medication Instructions:  No changes *If you need a refill on your cardiac medications before your next appointment, please call your pharmacy*   Lab Work: Today: BMET, CBC  If you have labs (blood work) drawn today and your tests are completely normal, you will receive your results only by: Marland Kitchen MyChart Message (if you have MyChart) OR . A paper copy in the mail If you have any lab test that is abnormal or we need to change your treatment, we will call you to review the results.   Testing/Procedures: Right and Left Heart Catheterization.  See instructions below.   Follow-Up: At [facility], you and your health needs are our priority.  As part of our continuing mission to provide you with exceptional heart care, we have created designated Provider Care Teams.  These Care Teams include your primary Cardiologist (physician) and Advanced Practice Providers (APPs -  Physician Assistants and Nurse Practitioners) who all work together to provide you with the care you need, when you need it.  We recommend signing up for the patient portal called "MyChart".  Sign up information is provided on this After Visit Summary.  MyChart is used to connect with patients for Virtual Visits (Telemedicine).  Patients are able to view lab/test results, encounter notes, upcoming appointments, etc.  Non-urgent messages can be sent to your provider as well.   To learn more about what you can do with MyChart, go to [email or web address].     Other Instructions We will be contacting you to arrange further steps in testing after the catheterization procedure.      [facility] CARDIOVASCULAR DIVISION [facility] ST OFFICE [address] [place] [address] Dept: [phone] Loc: [phone]  NAJMO PARDUE  05/23/2020  You are scheduled for a Cardiac Catheterization on Thursday, May 12 with Dr. [name].  1. Please arrive at the [facility] (Main Entrance A) at [facility]: [address] [place], [place] [address] at 7:00 AM (This time is two hours before your procedure to ensure your preparation). Free valet parking service is available.   Special note: Every effort is made to have your procedure done on time. Please understand that emergencies sometimes delay scheduled procedures.  2. Diet: Do not eat solid foods after midnight.  The patient may have clear liquids until 5am upon the day of the procedure.  3. Labs/COVID Screening: blood work today (BMET, CBC) Due to recent COVID-19 restrictions implemented by our local and state authorities and in an effort to keep both patients and staff as safe as possible, our hospital system requires COVID-19 testing prior to certain scheduled hospital procedures.  Please go to [address] [facility]. [place], [place] [address] on 05/30/20 at 12:30 pm.  This is a drive up testing site.  You will not need to exit your vehicle.  You will not be billed at the time of testing but may receive a bill later depending on your insurance. You must agree to self-quarantine from the time of your testing until the procedure date on 06/01/20.  This should included staying home with ONLY the people you live with.  Avoid take-out, grocery store shopping or leaving the house for any non-emergent reason.  Failure to have your COVID-19 test done on the date and time you have been scheduled will result in cancellation of your procedure.  Please call our office at [phone] if you have any questions.   4. Medication instructions in preparation  for your procedure:   Contrast Allergy: No  On the morning of your procedure, take your Aspirin and any morning medicines NOT listed above.  You may use sips of water.  5. Plan for one night stay--bring personal belongings. 6. Bring a current list of your medications and current insurance cards. 7. You MUST have a responsible person to drive you home. 8. Someone MUST  be with you the first 24 hours after you arrive home or your discharge will be delayed. 9. Please wear clothes that are easy to get on and off and wear slip-on shoes.  Thank you for allowing [place] to care for you!   -- [facility]

## 2020-05-30 ENCOUNTER — Telehealth: Payer: Self-pay | Admitting: *Deleted

## 2020-05-30 ENCOUNTER — Other Ambulatory Visit (HOSPITAL_COMMUNITY)
Admission: RE | Admit: 2020-05-30 | Discharge: 2020-05-30 | Disposition: A | Discharge status: Home / Self Care | Payer: Medicare Other | Source: Ambulatory Visit | Attending: Cardiovascular Disease | Admitting: Cardiovascular Disease

## 2020-05-30 DIAGNOSIS — Z01812 Encounter for preprocedural laboratory examination: Secondary | ICD-10-CM | POA: Diagnosis present

## 2020-05-30 DIAGNOSIS — Z20822 Contact with and (suspected) exposure to covid-19: Secondary | ICD-10-CM | POA: Diagnosis not present

## 2020-05-30 LAB — SARS CORONAVIRUS 2 (TAT 6-24 HRS): SARS Coronavirus 2: NEGATIVE

## 2020-05-30 MED ORDER — AMLODIPINE BESYLATE 10 MG PO TABS: 10.0000 mg | ORAL_TABLET | Freq: Every day | ORAL | 1 refills | Status: DC

## 2020-05-30 NOTE — Telephone Encounter (Signed)
Pt contacted pre-catheterization scheduled at [facility] for: Thursday Jun 01, 2020 9 AM Verified arrival time and place: [facility] Main Entrance A [facility]) at: 6:30 AM-needs BMP   No solid food after midnight prior to cath, clear liquids until 5 AM day of procedure.   AM meds can be  taken pre-cath with sips of water including: ASA 81 mg   Confirmed patient has responsible adult to drive home post procedure and be with patient first 24 hours after arriving home: yes  You are allowed ONE visitor in the waiting room during the time you are at the hospital for your procedure. Both you and your visitor must wear a mask once you enter the hospital.    Reviewed procedure/mask/visitor instructions with patient.      Per Dr Clifton James- copied from 05/23/20 BMP results:                    Potassium is high. No recent labs. Will need repeat BMET. I would stop Azor and start Norvasc 10 to get rid of the ARB. Thanks, chris   Per Dr Clifton James- pt instructed  to stop Azor and start Norvasc 10 mg daily, pt aware will plan to check BMP at hospital on arrival morning of procedure.

## 2020-06-01 ENCOUNTER — Inpatient Hospital Stay (HOSPITAL_COMMUNITY): Payer: Medicare Other

## 2020-06-01 ENCOUNTER — Observation Stay (HOSPITAL_COMMUNITY)
Admission: RE | Admit: 2020-06-01 | Discharge: 2020-06-02 | Disposition: A | Discharge status: Home / Self Care | Payer: Medicare Other | Attending: Internal Medicine | Admitting: Internal Medicine

## 2020-06-01 ENCOUNTER — Telehealth: Payer: Self-pay | Admitting: Cardiovascular Disease

## 2020-06-01 ENCOUNTER — Inpatient Hospital Stay (HOSPITAL_BASED_OUTPATIENT_CLINIC_OR_DEPARTMENT_OTHER): Payer: Medicare Other

## 2020-06-01 ENCOUNTER — Ambulatory Visit (HOSPITAL_COMMUNITY): Payer: Medicare Other

## 2020-06-01 DIAGNOSIS — R4020 Unspecified coma: Secondary | ICD-10-CM | POA: Diagnosis present

## 2020-06-01 DIAGNOSIS — Z539 Procedure and treatment not carried out, unspecified reason: Secondary | ICD-10-CM | POA: Diagnosis not present

## 2020-06-01 DIAGNOSIS — E1169 Type 2 diabetes mellitus with other specified complication: Secondary | ICD-10-CM | POA: Diagnosis not present

## 2020-06-01 DIAGNOSIS — E785 Hyperlipidemia, unspecified: Secondary | ICD-10-CM | POA: Diagnosis present

## 2020-06-01 DIAGNOSIS — I10 Essential (primary) hypertension: Secondary | ICD-10-CM | POA: Insufficient documentation

## 2020-06-01 DIAGNOSIS — J45909 Unspecified asthma, uncomplicated: Secondary | ICD-10-CM | POA: Insufficient documentation

## 2020-06-01 DIAGNOSIS — F17210 Nicotine dependence, cigarettes, uncomplicated: Secondary | ICD-10-CM | POA: Diagnosis not present

## 2020-06-01 DIAGNOSIS — I352 Nonrheumatic aortic (valve) stenosis with insufficiency: Secondary | ICD-10-CM | POA: Diagnosis not present

## 2020-06-01 DIAGNOSIS — I639 Cerebral infarction, unspecified: Principal | ICD-10-CM | POA: Insufficient documentation

## 2020-06-01 DIAGNOSIS — J9601 Acute respiratory failure with hypoxia: Secondary | ICD-10-CM | POA: Diagnosis not present

## 2020-06-01 DIAGNOSIS — E1165 Type 2 diabetes mellitus with hyperglycemia: Secondary | ICD-10-CM | POA: Diagnosis not present

## 2020-06-01 DIAGNOSIS — J449 Chronic obstructive pulmonary disease, unspecified: Secondary | ICD-10-CM | POA: Insufficient documentation

## 2020-06-01 DIAGNOSIS — I350 Nonrheumatic aortic (valve) stenosis: Secondary | ICD-10-CM | POA: Diagnosis not present

## 2020-06-01 DIAGNOSIS — I2510 Atherosclerotic heart disease of native coronary artery without angina pectoris: Secondary | ICD-10-CM | POA: Insufficient documentation

## 2020-06-01 DIAGNOSIS — Z8709 Personal history of other diseases of the respiratory system: Secondary | ICD-10-CM

## 2020-06-01 DIAGNOSIS — Z8673 Personal history of transient ischemic attack (TIA), and cerebral infarction without residual deficits: Secondary | ICD-10-CM | POA: Diagnosis present

## 2020-06-01 DIAGNOSIS — Z79899 Other long term (current) drug therapy: Secondary | ICD-10-CM | POA: Insufficient documentation

## 2020-06-01 DIAGNOSIS — Z720 Tobacco use: Secondary | ICD-10-CM | POA: Diagnosis present

## 2020-06-01 DIAGNOSIS — G9341 Metabolic encephalopathy: Secondary | ICD-10-CM | POA: Insufficient documentation

## 2020-06-01 DIAGNOSIS — E119 Type 2 diabetes mellitus without complications: Secondary | ICD-10-CM | POA: Insufficient documentation

## 2020-06-01 DIAGNOSIS — I6389 Other cerebral infarction: Secondary | ICD-10-CM | POA: Diagnosis not present

## 2020-06-01 DIAGNOSIS — E039 Hypothyroidism, unspecified: Secondary | ICD-10-CM | POA: Diagnosis not present

## 2020-06-01 LAB — BASIC METABOLIC PANEL
Anion gap: 7 (ref 5–15)
BUN: 27 mg/dL — ABNORMAL HIGH (ref 8–23)
CO2: 30 mmol/L (ref 22–32)
Calcium: 9.3 mg/dL (ref 8.9–10.3)
Chloride: 93 mmol/L — ABNORMAL LOW (ref 98–111)
Creatinine, Ser: 0.99 mg/dL (ref 0.44–1.00)
GFR, Estimated: >60 mL/min (ref >60)
Glucose, Bld: 125 mg/dL — ABNORMAL HIGH (ref 70–99)
Potassium: 5.1 mmol/L (ref 3.5–5.1)
Sodium: 130 mmol/L — ABNORMAL LOW (ref 135–145)

## 2020-06-01 LAB — ECHOCARDIOGRAM COMPLETE
AR max vel: 1.41 cm2
AV Area VTI: 1.29 cm2
AV Area mean vel: 1.27 cm2
AV Mean grad: 29.3 mmHg
AV Peak grad: 53.8 mmHg
Ao pk vel: 3.67 m/s
Area-P 1/2: 4.21 cm2
Calc EF: 47.8 %
Height: 65 in
MV M vel: 4.83 m/s
MV Peak grad: 93.3 mmHg
MV VTI: 2.9 cm2
P 1/2 time: 279 msec
S' Lateral: 3.3 cm
Single Plane A2C EF: 32.8 %
Single Plane A4C EF: 61 %
Weight: 2948.87 oz

## 2020-06-01 LAB — CBC
HCT: 43.4 % (ref 36.0–46.0)
Hemoglobin: 14.4 g/dL (ref 12.0–15.0)
MCH: 30.7 pg (ref 26.0–34.0)
MCHC: 33.2 g/dL (ref 30.0–36.0)
MCV: 92.5 fL (ref 80.0–100.0)
Platelets: 265 10*3/uL (ref 150–400)
RBC: 4.69 MIL/uL (ref 3.87–5.11)
RDW: 13.9 % (ref 11.5–15.5)
WBC: 9.5 10*3/uL (ref 4.0–10.5)
nRBC: 0 % (ref 0.0–0.2)

## 2020-06-01 LAB — GLUCOSE, CAPILLARY
Glucose-Capillary: 249 mg/dL — ABNORMAL HIGH (ref 70–99)
Glucose-Capillary: 132 mg/dL — ABNORMAL HIGH (ref 70–99)
Glucose-Capillary: 118 mg/dL — ABNORMAL HIGH (ref 70–99)
Glucose-Capillary: 130 mg/dL — ABNORMAL HIGH (ref 70–99)
Glucose-Capillary: 115 mg/dL — ABNORMAL HIGH (ref 70–99)

## 2020-06-01 LAB — AMMONIA: Ammonia: 20 umol/L (ref 9–35)

## 2020-06-01 LAB — BLOOD GAS, VENOUS
Acid-Base Excess: 2.5 mmol/L — ABNORMAL HIGH (ref 0.0–2.0)
Bicarbonate: 28.3 mmol/L — ABNORMAL HIGH (ref 20.0–28.0)
Drawn by: 6054
FIO2: 28
O2 Saturation: 80.4 %
Patient temperature: 37
pCO2, Ven: 58.4 mmHg (ref 44.0–60.0)
pH, Ven: 7.306 (ref 7.250–7.430)
pO2, Ven: 44.3 mmHg (ref 32.0–45.0)

## 2020-06-01 LAB — RAPID URINE DRUG SCREEN, HOSP PERFORMED
Amphetamines: NOT DETECTED
Barbiturates: NOT DETECTED
Benzodiazepines: NOT DETECTED
Cocaine: NOT DETECTED
Opiates: NOT DETECTED
Tetrahydrocannabinol: POSITIVE — AB

## 2020-06-01 LAB — PROTIME-INR
INR: 0.9 (ref 0.8–1.2)
Prothrombin Time: 12 seconds (ref 11.4–15.2)

## 2020-06-01 LAB — HEMOGLOBIN A1C
Hgb A1c MFr Bld: 11.1 % — ABNORMAL HIGH (ref 4.8–5.6)
Mean Plasma Glucose: 271.87 mg/dL

## 2020-06-01 LAB — LIPID PANEL
Cholesterol: 207 mg/dL — ABNORMAL HIGH (ref 0–200)
HDL: 46 mg/dL (ref >40)
LDL Cholesterol: 114 mg/dL — ABNORMAL HIGH (ref 0–99)
Total CHOL/HDL Ratio: 4.5 RATIO
Triglycerides: 235 mg/dL — ABNORMAL HIGH (ref <150)
VLDL: 47 mg/dL — ABNORMAL HIGH (ref 0–40)

## 2020-06-01 LAB — HIV ANTIBODY (ROUTINE TESTING W REFLEX): HIV Screen 4th Generation wRfx: NONREACTIVE

## 2020-06-01 MED ORDER — CLOPIDOGREL BISULFATE 75 MG PO TABS
75.0000 mg | ORAL_TABLET | Freq: Every day | ORAL | Status: DC
  Administered 2020-06-02: 75 mg via ORAL
  Filled 2020-06-01: qty 1

## 2020-06-01 MED ORDER — SODIUM CHLORIDE 0.9 % WEIGHT BASED INFUSION: 1.0000 mL/kg/h | INTRAVENOUS | Status: DC

## 2020-06-01 MED ORDER — NICOTINE 14 MG/24HR TD PT24
14.0000 mg | MEDICATED_PATCH | Freq: Every day | TRANSDERMAL | Status: DC
  Administered 2020-06-01 – 2020-06-02 (×2): 14 mg via TRANSDERMAL
  Filled 2020-06-01 (×2): qty 1

## 2020-06-01 MED ORDER — STROKE: EARLY STAGES OF RECOVERY BOOK
Freq: Once | Status: DC
  Filled 2020-06-01: qty 1

## 2020-06-01 MED ORDER — DOXEPIN HCL 50 MG PO CAPS
300.0000 mg | ORAL_CAPSULE | Freq: Every day | ORAL | Status: DC
  Filled 2020-06-01: qty 3
  Filled 2020-06-01: qty 6

## 2020-06-01 MED ORDER — ATORVASTATIN CALCIUM 80 MG PO TABS
80.0000 mg | ORAL_TABLET | Freq: Every day | ORAL | Status: DC
  Administered 2020-06-02: 80 mg via ORAL
  Filled 2020-06-01: qty 1

## 2020-06-01 MED ORDER — SODIUM CHLORIDE 0.9% FLUSH: 3.0000 mL | INTRAVENOUS | Status: DC | PRN

## 2020-06-01 MED ORDER — SENNOSIDES-DOCUSATE SODIUM 8.6-50 MG PO TABS: 1.0000 | ORAL_TABLET | Freq: Every evening | ORAL | Status: DC | PRN

## 2020-06-01 MED ORDER — INSULIN ASPART 100 UNIT/ML IJ SOLN
0.0000 [IU] | Freq: Three times a day (TID) | INTRAMUSCULAR | Status: DC
  Administered 2020-06-02: 3 [IU] via SUBCUTANEOUS
  Administered 2020-06-02: 2 [IU] via SUBCUTANEOUS

## 2020-06-01 MED ORDER — SODIUM CHLORIDE 0.9 % WEIGHT BASED INFUSION: 3.0000 mL/kg/h | INTRAVENOUS | Status: AC

## 2020-06-01 MED ORDER — ACETAMINOPHEN 325 MG PO TABS
650.0000 mg | ORAL_TABLET | ORAL | Status: DC | PRN
  Administered 2020-06-02: 650 mg via ORAL
  Filled 2020-06-01: qty 2

## 2020-06-01 MED ORDER — ACETAMINOPHEN 650 MG RE SUPP: 650.0000 mg | RECTAL | Status: DC | PRN

## 2020-06-01 MED ORDER — VENLAFAXINE HCL 50 MG PO TABS
50.0000 mg | ORAL_TABLET | Freq: Two times a day (BID) | ORAL | Status: DC
  Administered 2020-06-02: 50 mg via ORAL
  Filled 2020-06-01 (×3): qty 1

## 2020-06-01 MED ORDER — ASPIRIN 81 MG PO CHEW
81.0000 mg | CHEWABLE_TABLET | ORAL | Status: AC
  Administered 2020-06-01: 81 mg via ORAL
  Filled 2020-06-01: qty 1

## 2020-06-01 MED ORDER — SODIUM CHLORIDE 0.9% FLUSH
3.0000 mL | Freq: Two times a day (BID) | INTRAVENOUS | Status: DC
  Administered 2020-06-01 – 2020-06-02 (×3): 3 mL via INTRAVENOUS

## 2020-06-01 MED ORDER — HYDRALAZINE HCL 20 MG/ML IJ SOLN: 10.0000 mg | INTRAMUSCULAR | Status: DC | PRN

## 2020-06-01 MED ORDER — NALOXONE HCL 0.4 MG/ML IJ SOLN
INTRAMUSCULAR | Status: AC
  Administered 2020-06-01: 0.4 mg
  Filled 2020-06-01: qty 1

## 2020-06-01 MED ORDER — NALOXONE HCL 0.4 MG/ML IJ SOLN
INTRAMUSCULAR | Status: AC
  Administered 2020-06-01: 0.2 mg
  Filled 2020-06-01: qty 1

## 2020-06-01 MED ORDER — ENOXAPARIN SODIUM 40 MG/0.4ML IJ SOSY
40.0000 mg | PREFILLED_SYRINGE | INTRAMUSCULAR | Status: DC
  Administered 2020-06-01: 40 mg via SUBCUTANEOUS
  Filled 2020-06-01: qty 0.4

## 2020-06-01 MED ORDER — NALOXONE HCL 0.4 MG/ML IJ SOLN: 0.4000 mg | INTRAMUSCULAR | Status: DC | PRN

## 2020-06-01 MED ORDER — ACETAMINOPHEN 160 MG/5ML PO SOLN: 650.0000 mg | ORAL | Status: DC | PRN

## 2020-06-01 MED ORDER — LEVOTHYROXINE SODIUM 25 MCG PO TABS
25.0000 ug | ORAL_TABLET | Freq: Every day | ORAL | Status: DC
  Administered 2020-06-02: 25 ug via ORAL
  Filled 2020-06-01: qty 1

## 2020-06-01 MED ORDER — SODIUM CHLORIDE 0.9 % IV SOLN: Freq: Once | INTRAVENOUS | Status: AC

## 2020-06-01 MED ORDER — SODIUM CHLORIDE 0.9 % IV SOLN: 250.0000 mL | INTRAVENOUS | Status: DC | PRN

## 2020-06-01 NOTE — H&P (Addendum)
History and Physical    Kristina Montgomery PJA:[id number] DOB: Apr 16, 1955 DOA: 06/01/2020  Referring MD/NP/PA: [name], MD PCP: Galvin Proffer, MD  Patient coming from: Short Stay   Chief Complaint: Cardiac catheterization  I have personally briefly reviewed patient's old medical records in [facility]   HPI: Kristina Montgomery is a 65 y.o. female with medical history significant of  HTN, HLD, CAD,COPD, fibromyalgia, chronic back pain, anxiety, and depression who presented initially to undergo cardiac catheterization prior to TAVR.  However, during initial evaluation patient was noted to be significantly lethargic and drowsy.  Patient admits that she had taken 30 mg of oxycodone this morning prior to her to the hospital which she takes due to issues with chronic back pain.  Due to symptoms persisting a stat CT scan of the brain without contrast was obtained and notable for acute stroke of the mid left occipital lobe near the parieto-occipital junction thought to be likely due to acute infarct.  A code stroke was called and patient was evaluated by neurology.  She had been given Narcan 0.6 mg twice with some improvement in her mentation.  She was not a tPA/thrombectomy candidate as her mentation was thought more likely secondary to her pain meds and less likely due to the area of the stroke.  Over the last week patient admits that she has been significantly tired.  She reports that her speech has been possibly little more slurred and that she has been stumbling intermittently.  She reports that she has had an issue with her left leg where she is not really able to raise it much, but reports that that started about a year ago.  Notes that it is also the same time that she had last fallen.  At this time she denies having any headache, change in vision, nausea, vomiting, chest pain, palpitations, abdominal pain, diarrhea, dysuria, or change in sensation.  Patient admits to smoking 1/2 pack  cigarettes per day on average.  Labs revealed CBC within normal limits, sodium 130, potassium 5.1, chloride 93 CO2 30, BUN 27, creatinine 0.99, and glucose 125. Cardiology deferred any further work-up at this time and requested [facility] to formally admit.   ED Course:  As seen above.  Review of Systems  Constitutional: Positive for malaise/fatigue. Negative for fever.  HENT: Negative for congestion and nosebleeds.   Eyes: Negative for photophobia and pain.  Respiratory: Negative for cough and shortness of breath.   Cardiovascular: Negative for chest pain, palpitations and leg swelling.  Gastrointestinal: Negative for abdominal pain, diarrhea, nausea and vomiting.  Genitourinary: Negative for dysuria and hematuria.  Musculoskeletal: Positive for back pain. Negative for falls and myalgias.  Skin: Negative for rash.  Neurological: Positive for speech change. Negative for loss of consciousness.  Psychiatric/Behavioral: Positive for substance abuse. Negative for memory loss.    Past Medical History:  Diagnosis Date  . Anxiety   . Asthma   . Chest pain   . COPD (chronic obstructive pulmonary disease) (HCC)   . Coronary artery disease   . Depression   . Diabetes mellitus without complication (HCC)   . Dizziness   . Emphysema of lung (HCC)   . Fatigue   . Fever   . Fibromyalgia   . Heart murmur   . Hypertension   . Mixed hyperlipidemia   . Muscle pain   . Rash    foot  . Swelling     Past Surgical History:  Procedure Laterality  Date  . CHOLECYSTECTOMY       reports that she has been smoking cigarettes. She has been smoking about 1.00 pack per day. She has never used smokeless tobacco. She reports that she does not drink alcohol and does not use drugs.  No Known Allergies  Family History  Problem Relation Age of Onset  . Heart disease Mother   . Cancer Father     Prior to Admission medications   Medication Sig Start Date End Date Taking? Authorizing  Provider  Aclidinium Bromide 400 MCG/ACT AEPB Inhale into the lungs.    [provider]  amLODipine (NORVASC) 10 MG tablet Take 1 tablet (10 mg total) by mouth daily. 05/30/20 08/28/20  Kathleene HazelMcAlhany, Christopher D, MD  ARIPiprazole (ABILIFY) 10 MG tablet Take 10 mg by mouth daily. 06/14/17   [provider]  doxepin (SINEQUAN) 100 MG capsule Take 100 mg by mouth at bedtime as needed. 06/09/17   [provider]  nebivolol (BYSTOLIC) 10 MG tablet Take 10 mg by mouth daily.    [provider]  oxyCODONE 30 MG 12 hr tablet Take 1 tablet by mouth as needed.    [provider]    Physical Exam:  Constitutional: Elderly female who is currently lethargic, but able to answer questions Vitals:   06/01/20 0703  BP: (!) 111/58  Pulse: (!) 106  Resp: 17  Temp: 97.9 F (36.6 C)  TempSrc: Oral  SpO2: (!) 86%  Weight: 81.6 kg  Height: 5\' 5"  (1.651 m)   Eyes: PERRL, lids and conjunctivae normal ENMT: Mucous membranes are dry. Posterior pharynx clear of any exudate or lesions.    Neck: normal, supple, no masses, no thyromegaly Respiratory: clear to auscultation bilaterally, no wheezing, no crackles. Normal respiratory effort. No accessory muscle use.  Cardiovascular: Regular rate and rhythm with a positive systolic blowing murmur. No extremity edema. 2+ pedal pulses. No carotid bruits.  Abdomen: no tenderness, no masses palpated. No hepatosplenomegaly. Bowel sounds positive.  Musculoskeletal: no clubbing / cyanosis. No joint deformity upper and lower extremities. Good ROM, no contractures. Normal muscle tone.  Skin: no rashes, lesions, ulcers. No induration Neurologic: CN 2-12 grossly intact.  Speech is just mildly slurred intermittently.  Strength appears to be 5/5 in both upper extremities and the right lower extremity.  Strength is approximately 3-4/5 in the left lower extremity. Psychiatric: Normal judgment and insight.  Lethargic and oriented x 3. Normal mood.      Labs on Admission: I have personally reviewed following labs and imaging studies  CBC: No results for input(s): WBC, NEUTROABS, HGB, HCT, MCV, PLT in the last 168 hours. Basic Metabolic Panel: No results for input(s): NA, K, CL, CO2, GLUCOSE, BUN, CREATININE, CALCIUM, MG, PHOS in the last 168 hours. GFR: Estimated Creatinine Clearance: 62.6 mL/min (by C-G formula based on SCr of 0.92 mg/dL). Liver Function Tests: No results for input(s): AST, ALT, ALKPHOS, BILITOT, PROT, ALBUMIN in the last 168 hours. No results for input(s): LIPASE, AMYLASE in the last 168 hours. No results for input(s): AMMONIA in the last 168 hours. Coagulation Profile: No results for input(s): INR, PROTIME in the last 168 hours. Cardiac Enzymes: No results for input(s): CKTOTAL, CKMB, CKMBINDEX, TROPONINI in the last 168 hours. BNP (last 3 results) No results for input(s): PROBNP in the last 8760 hours. HbA1C: No results for input(s): HGBA1C in the last 72 hours. CBG: Recent Labs  Lab 06/01/20 0706 06/01/20 1141  GLUCAP 249* 132*   Lipid Profile: No results  for input(s): CHOL, HDL, LDLCALC, TRIG, CHOLHDL, LDLDIRECT in the last 72 hours. Thyroid Function Tests: No results for input(s): TSH, T4TOTAL, FREET4, T3FREE, THYROIDAB in the last 72 hours. Anemia Panel: No results for input(s): VITAMINB12, FOLATE, FERRITIN, TIBC, IRON, RETICCTPCT in the last 72 hours. Urine analysis: No results found for: COLORURINE, APPEARANCEUR, LABSPEC, PHURINE, GLUCOSEU, HGBUR, BILIRUBINUR, KETONESUR, PROTEINUR, UROBILINOGEN, NITRITE, LEUKOCYTESUR Sepsis Labs: Recent Results (from the past 240 hour(s))  SARS CORONAVIRUS 2 (TAT 6-24 HRS) Nasopharyngeal Nasopharyngeal Swab     Status: None   Collection Time: 05/30/20 12:37 PM   Specimen: Nasopharyngeal Swab  Result Value Ref Range Status   SARS Coronavirus 2 NEGATIVE NEGATIVE Final    Comment: (NOTE) SARS-CoV-2 target nucleic acids are NOT DETECTED.  The SARS-CoV-2 RNA  is generally detectable in upper and lower respiratory specimens during the acute phase of infection. Negative results do not preclude SARS-CoV-2 infection, do not rule out co-infections with other pathogens, and should not be used as the sole basis for treatment or other patient management decisions. Negative results must be combined with clinical observations, patient history, and epidemiological information. The expected result is Negative.  Fact Sheet for Patients: HairSlick.no  Fact Sheet for Healthcare Providers: quierodirigir.com  This test is not yet approved or cleared by the [place] FDA and  has been authorized for detection and/or diagnosis of SARS-CoV-2 by FDA under an Emergency Use Authorization (EUA). This EUA will remain  in effect (meaning this test can be used) for the duration of the COVID-19 declaration under Se ction 564(b)(1) of the Act, 21 U.S.C. section 360bbb-3(b)(1), unless the authorization is terminated or revoked sooner.  Performed at [facility], [address], [place], [place] [address]      Radiological Exams on Admission: CT HEAD WO CONTRAST  Result Date: 06/01/2020 CLINICAL DATA:  Altered mental status EXAM: CT HEAD WITHOUT CONTRAST TECHNIQUE: Contiguous axial images were obtained from the base of the skull through the vertex without intravenous contrast. COMPARISON:  April 28, 2014. FINDINGS: Brain: Ventricles and sulci are normal in size and configuration. No well-defined mass. No hemorrhage, extra-axial fluid collection, midline shift. There is decreased attenuation in the superior midportion left occipital lobe near the left parieto-occipital junction consistent with acute infarct. Elsewhere brain parenchyma appears unremarkable. Vascular: No hyperdense vessel. Foci of calcification noted in the carotid siphon regions. Skull: Bony calvarium appears intact. Sinuses/Orbits: Paranasal  sinuses are clear. There is leftward deviation of the nasal septum. Orbits appear symmetric bilaterally. Other: Mastoid air cells are clear. IMPRESSION: Decreased attenuation in the superior mid left occipital lobe near the parieto-occipital junction, likely due to acute infarct. No similar changes elsewhere. No evident mass on noncontrast study. No hemorrhage. Foci of arterial vascular calcification noted. Deviated nasal septum. These results will be called to the ordering clinician or representative by the Radiologist Assistant, and communication documented in the PACS or [employer]. Electronically Signed   By: Bretta Bang III M.D.   On: 06/01/2020 11:12    EKG: Independently reviewed from 4/25.  Sinus tachycardia at 113 bpm  Assessment/Plan Acute metabolic encephalopathy: Acute.  Patient presented for her cardiac cath and was noted to be significantly lethargic.  She admitted to taking her OxyContin 30 mg this morning.  Suspect things could possibly be multifactorial in the setting of opiate use as well as the possibility of CO2 retention given history of COPD. -Admit to a medical telemetry bed -Check urine drug screen -Check ABG   CVA: Acute/subacute.  Patient reports over this last week that she has been more fatigued and then intermittently stumbling although she remains to seem more chronic in nature.  CT scan of the brain was obtained which revealed signs of an acute left mid occipital infarct near the parieto-occipital junction.  Factors include HTN, DM, and tobacco abuse -Stroke order set initiated -Neuro checks -N.p.o. until formally evaluated by speech -Check  MRI/MRA head w/o contrast and Vas carotid U/S -Check Hemoglobin A1c and lipid panel in a.m. -Allow for permissive hypertension for 24 hours given blood pressures less than 220/110 -Check echocardiogram -PT/OT/Speech to evaluation and treat -ASA and Plavix for 21 days, and then aspirin 81 mg p.o. daily  alone -Appreciate neurology consultative services, will follow-up further recommendations  Acute respiratory failure with hypoxia COPD: Patient had been found to have O2 saturations as low as 86% on room air for which she was placed on 2 L nasal cannula oxygen.  Normally patient not on oxygen at baseline.  No significant wheezes or rhonchi appreciated on physical exam. -Continuous pulse oximetry with nasal cannula oxygen -Follow-up ABG -Check for chest x-ray  Aortic stenosis Diastolic CHF: Last echo was 60-65% with severe aortic stenosis.  Plans had been initially for patient undergo a cardiac cath today with plans for TAVR in the future.  However, plans were deferred for now due to above. -Follow-up with cardiology  Essential hypertension: Blood pressures currently maintained. -Held home blood pressure medications to allow for permissive hypertension -Hydralazine IV for systolic blood pressure greater than 220/110.  Diabetes mellitus type 2 -Hypoglycemic protocols -CBGs before every meal with sensitive SSI -Adjust insulin regimen as  Hyperlipidemia -Follow-up lipid panel -Will start statin if LDL >70  Tobacco abuse: Patient reports smoking half pack cigarettes per day on average currently. -Nicotine patch offered -Continue to counsel on need of cessation of tobacco and illicit drug use  DVT prophylaxis: Lovenox Code Status: Full Family Communication: Son updated over the phone Disposition Plan: tbd Consults called: Neurology Admission status: Inpatient, require more than 2 midnight stay due to acute stroke  Clydie Braun MD [facility]   If 7PM-7AM, please contact night-coverage   06/01/2020, 12:09 PM

## 2020-06-01 NOTE — Telephone Encounter (Signed)
Spoke with Diane from [facility] Radiology for stat report for Head CT.    IMPRESSION: Decreased attenuation in the superior mid left occipital lobe near the parieto-occipital junction, likely due to acute infarct. No similar changes elsewhere. No evident mass on noncontrast study. No hemorrhage.  Foci of arterial vascular calcification noted. Deviated nasal Septum.  Will forward information to Dr Clifton James as well as Dr Elberta Fortis, DOD. Report taken to Dr Colon Flattery for review.

## 2020-06-01 NOTE — Code Documentation (Signed)
Pt is a 65 yr old female who was admitted today for cardiac catheterization. She was last known well at [phone] when she walked in with her son. She was drowsy at 0942 so stat CT head was ordered by admitting MD. CT reported as positive for stroke, and code stroke was activated at 1136. Neuro staff to bedside at 1140. Pt continues to be lethargic, but will answer questions and follow commands. No focal abnormalities noted, only drowsiness. Pt was given Narcan per MD order, as she has taken Oxycodone this morning. Pt became a bit more alert, but still sleepy. Blood drawn, VS taken and CBG obtained. Per Dr Derry Lory, pt has already had a completed stroke on CT. Not eligible for TPA as stroke appears completed. Not candidate for NIR as clinical exam negative for LVO. Pt will not undergo cardiac cath at this time, but will be admitted for her stroke. Asprin ordered, and will be given by Darreld Mclean RN after stroke swallow screen is passed. (Asprin may be given PR if fails swallow screen). The patient will need q 2 hr VS and mNIHSS as she continues workup. Pt's son updated by neurologist at bedside. Bedside handoff with Darreld Mclean RN complete.

## 2020-06-01 NOTE — Telephone Encounter (Signed)
We are aware of the findings. Thanks, chris

## 2020-06-01 NOTE — Progress Notes (Signed)
Pt approved to travel to off monitor per Michela Pitcher for Dr  Pearletha Alfred

## 2020-06-01 NOTE — Telephone Encounter (Signed)
Kristina Montgomery is calling to give a call report. Please advise.

## 2020-06-01 NOTE — Progress Notes (Addendum)
Patient was noted to be still very lethargic on physical exam, but had declined MRI due to being claustrophobic.  At this time will postpone MRI until tomorrow as patient thought to be too lethargic to give Ativan for sedation and also holding oxycodone at this time.  Venous blood gas did not show significant CO2 retention and urine drug screen was positive for marijuana, but not opiates.

## 2020-06-01 NOTE — Progress Notes (Signed)
*  PRELIMINARY RESULTS* Echocardiogram 2D Echocardiogram has been performed.  Neomia Dear RDCS 06/01/2020, 2:55 PM

## 2020-06-01 NOTE — Progress Notes (Signed)
Pt very lethargic on arrival to unit. Will open eyes to touch and sound. Follows commands at times, but then falls back asleep. Pt squeezed my fingers with hands, but when asked to move legs she fell back asleep. Pt does move legs spontaneously. Cont to monitor. Emelda Brothers RN

## 2020-06-01 NOTE — Progress Notes (Signed)
Carotid duplex bilateral study completed.   Please see CV Proc for preliminary results.   Ardyce Heyer, RDMS, RVT  

## 2020-06-01 NOTE — Progress Notes (Addendum)
PT was able to drink 3 oz of water without difficulty. No coughing noted.

## 2020-06-01 NOTE — Progress Notes (Signed)
Code Stroke called. Lillia Abed, [place] in to see pt

## 2020-06-01 NOTE — Progress Notes (Signed)
Pt returned from CT scan at approx 1120 and stated she needed to use the restroom. She wanted to go to the restroom. Pt sat at bedside with minimal assist, she stood and pivoted into a WC, took to restroom where she stood pivoted and pulled her own pants down with bilateral hands. Pt wiped herself and stood on her own and pulled her pants up. She got into [facility] on her own and was taken back to bed. She got up on own accord and got into the bed, I gave her her call light and explained how to operate it, covered her and left room.

## 2020-06-01 NOTE — Progress Notes (Signed)
   06/01/20 2001  Assess: MEWS Score  Temp 98.4 F (36.9 C)  BP (!) 125/57  Pulse Rate 90  ECG Heart Rate 91  Resp 13  SpO2 90 %  O2 Device Nasal Cannula  O2 Flow Rate (L/min) 2 L/min  Assess: MEWS Score  MEWS Temp 0  MEWS Systolic 0  MEWS Pulse 0  MEWS RR 1  MEWS LOC 1  MEWS Score 2  MEWS Score Color Yellow  Assess: if the MEWS score is Yellow or Red  Were vital signs taken at a resting state? Yes  Focused Assessment No change from prior assessment  Early Detection of Sepsis Score *See Row Information* Low  MEWS guidelines implemented *See Row Information* Yes  Treat  MEWS Interventions Escalated (See documentation below)  Take Vital Signs  Increase Vital Sign Frequency  Yellow: Q 2hr X 2 then Q 4hr X 2, if remains yellow, continue Q 4hrs  Escalate  MEWS: Escalate Yellow: discuss with charge nurse/RN and consider discussing with provider and RRT  Notify: Charge Nurse/RN  Name of Charge Nurse/RN Notified Monica RN  Date Charge Nurse/RN Notified 06/01/20  Time Charge Nurse/RN Notified 2012  Document  Patient Outcome Not stable and remains on department  Progress note created (see row info) Yes

## 2020-06-01 NOTE — Progress Notes (Signed)
Cardiology Note:  Pt arrived from home to the Short Stay area with drowsiness after taking her home dose of Oxycodone. She has continued to have altered mental status but is able to be aroused. No focal neurological findings.  -Will cancel cardiac cath today secondary to altered mental status which is felt to be due to her Oxycodone at home. Her son confirms that this happens often with her at home. -Will admit to the cardiology service  -Head CT today -Cardiac cath tomorrow if mental status improves.   [name] 06/01/2020 9:48 AM

## 2020-06-01 NOTE — Consult Note (Addendum)
NEUROLOGY CONSULTATION NOTE   Date of service: Jun 01, 2020 Patient Name: Kristina Montgomery MRN:  [id number] DOB:  20-Jul-1955 Reason for consult: "Stroke code" Requesting Provider: Kathleene Hazel _ _ _   _ __   _ __ _ _  __ __   _ __   __ _  History of Present Illness  Kristina Montgomery is a 65 y.o. female with PMH significant for COPD, coronary artery disease, depression, diabetes, aortic stenosis, hypertension, hyperlipidemia who was electively admitted to the short stay area for cardiac cath for potential TAVR for aortic stenosis.  She took her oxycodone this morning prior to coming in. She was noted to be very lethargic and drowsy and cath was cancelled. A STAT CTH was obtained and was notable for an acute stroke in the mid left occipital lobe near the parieto-occipital junction, likey an acute infarct. A stroke code was called with a LKW of [id number].  Of note, patient did get up to use the bathroom after the [facility].  On my evaluation, patient is somnolent. Mentation did improve after Narcan 0.6mg  was given. She was noted to have some LLE weakness and numbess which is chronic from her hip pain and noted to be encephalopathic.  MRS: 1 NIHSS:  NIHSS components Score: Comment  1a Level of Conscious 0[]  1[x]  2[]  3[]      1b LOC Questions 0[]  1[x]  2[]       1c LOC Commands 0[x]  1[]  2[]       2 Best Gaze 0[x]  1[]  2[]       3 Visual 0[x]  1[]  2[]  3[]      4 Facial Palsy 0[x]  1[]  2[]  3[]      5a Motor Arm - left 0[x]  1[]  2[]  3[]  4[]  UN[]    5b Motor Arm - Right 0[x]  1[]  2[]  3[]  4[]  UN[]    6a Motor Leg - Left 0[x]  1[]  2[]  3[]  4[]  UN[]    6b Motor Leg - Right 0[]  1[]  2[x]  3[]  4[]  UN[]    7 Limb Ataxia 0[x]  1[]  2[]  3[]  UN[]     8 Sensory 0[]  1[x]  2[]  UN[]      9 Best Language 0[x]  1[]  2[]  3[]      10 Dysarthria 0[x]  1[]  2[]  UN[]      11 Extinct. and Inattention 0[x]  1[]  2[]       TOTAL: 4   TPA/ Thrombectomy: Not offered. Stroke appears to be complete on the [name]. I doubt that her lethargy is explained  by the stroke. The Left leg symptoms are chronic and not worse than her baseline. She essentially has no new deficit from this stroke.   ROS   Unable to obtain due to somnolence.  Past History   Past Medical History:  Diagnosis Date  . Anxiety   . Asthma   . Chest pain   . COPD (chronic obstructive pulmonary disease) (HCC)   . Coronary artery disease   . Depression   . Diabetes mellitus without complication (HCC)   . Dizziness   . Emphysema of lung (HCC)   . Fatigue   . Fever   . Fibromyalgia   . Heart murmur   . Hypertension   . Mixed hyperlipidemia   . Muscle pain   . Rash    foot  . Swelling    Past Surgical History:  Procedure Laterality Date  . CHOLECYSTECTOMY     Family History  Problem Relation Age of Onset  . Heart disease Mother   . Cancer Father    Social History   Socioeconomic  History  . Marital status: Widowed    Spouse name: Not on file  . Number of children: 4  . Years of education: Not on file  . Highest education level: Not on file  Occupational History  . Occupation: On disability for 20 years  Tobacco Use  . Smoking status: Current Every Day Smoker    Packs/day: 1.00    Types: Cigarettes  . Smokeless tobacco: Never Used  Substance and Sexual Activity  . Alcohol use: No  . Drug use: No  . Sexual activity: Not on file  Other Topics Concern  . Not on file  Social History Narrative  . Not on file   Social Determinants of Health   Financial Resource Strain: Not on file  Food Insecurity: Not on file  Transportation Needs: Not on file  Physical Activity: Not on file  Stress: Not on file  Social Connections: Not on file   No Known Allergies  Medications   Medications Prior to Admission  Medication Sig Dispense Refill Last Dose  . Aclidinium Bromide 400 MCG/ACT AEPB Inhale into the lungs.     Marland Kitchen amLODipine (NORVASC) 10 MG tablet Take 1 tablet (10 mg total) by mouth daily. 90 tablet 1   . ARIPiprazole (ABILIFY) 10 MG tablet Take  10 mg by mouth daily.  0   . doxepin (SINEQUAN) 100 MG capsule Take 100 mg by mouth at bedtime as needed.  2   . nebivolol (BYSTOLIC) 10 MG tablet Take 10 mg by mouth daily.     Marland Kitchen oxyCODONE 30 MG 12 hr tablet Take 1 tablet by mouth as needed.        Vitals   Vitals:   06/01/20 0703  BP: (!) 111/58  Pulse: (!) 106  Resp: 17  Temp: 97.9 F (36.6 C)  TempSrc: Oral  SpO2: (!) 86%  Weight: 81.6 kg  Height: 5\' 5"  (1.651 m)     Body mass index is 29.95 kg/m.  Physical Exam   General: Laying comfortably in bed; in no acute distress.  HENT: Normal oropharynx and mucosa. Normal external appearance of ears and nose.  Neck: Supple, no pain or tenderness  CV: No JVD. No peripheral edema.  Pulmonary: Symmetric Chest rise. Normal respiratory effort.  Abdomen: Soft to touch, non-tender.  Ext: No cyanosis, edema, or deformity  Skin: No rash. Normal palpation of skin.   Musculoskeletal: Normal digits and nails by inspection. No clubbing.   Neurologic Examination  Mental status/Cognition: Drowsy, oriented to self, place, poor attention. Speech/language: Fluent, comprehension intact, object naming intact, repetition intact. Cranial nerves:   CN II Pupils equal and reactive to light, no VF deficits   CN III,IV,VI EOM intact, no gaze preference or deviation, no nystagmus   CN V normal sensation in V1, V2, and V3 segments bilaterally   CN VII no asymmetry, no nasolabial fold flattening   CN VIII normal hearing to speec   CN IX & X normal palatal elevation, no uvular deviatio   CN XI 5/5 head turn and 5/5 shoulder shrug bilaterall   CN XII midline tongue protrusion   Motor:  Muscle bulk: normal, tone normal, pronator drift none tremor none Mvmt Root Nerve  Muscle Right Left Comments  SA C5/6 Ax Deltoid 5 5   EF C5/6 Mc Biceps 5 5   EE C6/7/8 Rad Triceps 5 5   WF C6/7 Med FCR     WE C7/8 PIN ECU     F Ab C8/T1 U ADM/FDI 5  5   HF L1/2/3 Fem Illopsoas 5 3   KE L2/3/4 Fem Quad     DF  L4/5 D Peron Tib Ant 5 5   PF S1/2 Tibial Grc/Sol 5 5    Reflexes:  Right Left Comments  Pectoralis      Biceps (C5/6) 1 1   Brachioradialis (C5/6) 1 1    Triceps (C6/7) 1 1    Patellar (L3/4) 1 1    Achilles (S1)      Hoffman      Plantar     Jaw jerk    Sensation:  Light touch Decreased mildly in LLE   Pin prick    Temperature    Vibration   Proprioception    Coordination/Complex Motor:  - Finger to Nose intact BL - Heel to shin unable to get her to do. - Rapid alternating movement are slowed. - Gait: deferred.  Labs   CBC:  Recent Labs  Lab 06/01/20 1143  WBC 9.5  HGB 14.4  HCT 43.4  MCV 92.5  PLT 265    Basic Metabolic Panel:  Lab Results  Component Value Date   NA 130 (L) 05/23/2020   K 5.4 (H) 05/23/2020   CO2 26 05/23/2020   GLUCOSE 374 (H) 05/23/2020   BUN 17 05/23/2020   CREATININE 0.92 05/23/2020   CALCIUM 9.4 05/23/2020   Lipid Panel: No results found for: LDLCALC HgbA1c: No results found for: HGBA1C Urine Drug Screen: No results found for: LABOPIA, COCAINSCRNUR, LABBENZ, AMPHETMU, THCU, LABBARB  Alcohol Level No results found for: [facility]  [facility] Head without contrast: Decreased attenuation in the superior mid left occipital lobe near the parieto-occipital junction, likely due to acute infarct. No similar changes elsewhere. No evident mass on noncontrast study. No hemorrhage.  Foci of arterial vascular calcification noted. Deviated nasal septum.  MR Angio head without contrast and Carotid Duplex BL: Pending,  MRI Brain: Pending.  Impression   Driscilla MoatsBrenda W Delaluz is a 65 y.o. female with PMH significant for smoker, COPD, coronary artery disease, depression, diabetes, aortic stenosis, hypertension, hyperlipidemia who was electively admitted to the short stay area for cardiac cath, found to be lethargic and a STAT CTH demonstrated an acute stroke in the superior mid left occipital lobe near the parieto-occipital junction. Her neurologic  examination is notable for somnolence with known prior LLE weakness and numbness.  As for her somnolence, stroke is les likely to explain it. Likely due to opiods. She had significant improvement in mentation with 0.6mg  of Narcan.  Primary Diagnosis:  Cerebral infarction, unspecified.  Secondary Diagnosis: Essential (primary) hypertension, Type 2 diabetes mellitus with hyperglycemia  and Obesity  Recommendations  Plan:  - Recommend admission to medicine service - Frequent Neuro checks per stroke unit protocol - Recommend brain imaging with MRI Brain without contrast - Recommend Vascular imaging with MRA Angio Head without contrast and US Carotid doppler - Recommend obtaining TTE - Recommend obtaining Lipid panel with LDL - Please start statin if LDL > 70 - Recommend HbA1c - Antithrombotic - Aspirin 81mg  daily along with plavix 75mg  daily x 21 days, then aspirin 81mg  daily alone. - Recommend DVT ppx - SBP goal - permissive hypertension first 24 h < 220/110. Held home meds.  - Recommend Telemetry monitoring for arrythmia - Recommend bedside swallow screen prior to PO intake. - Stroke education booklet - Recommend PT/OT/SLP consult - Recommend Urine Tox screen. - recommend judicious use of opiods.   _________________________________________________________________  This patient is critically ill and  at significant risk of neurological worsening, death and care requires constant monitoring of vital signs, hemodynamics,respiratory and cardiac monitoring, neurological assessment, discussion with family, other specialists and medical decision making of high complexity. I spent 40 minutes of neurocritical care time  in the care of  this patient. This was time spent independent of any time provided by nurse practitioner or PA.  Erick Blinks [facility] Pager Number [phone] 06/01/2020  12:43 PM   Thank you for the opportunity to take part in the care of this  patient. If you have any further questions, please contact the neurology consultation attending.  Signed,  Erick Blinks [facility] Pager Number [phone] _ _ _   _ __   _ __ _ _  __ __   _ __   __ _

## 2020-06-01 NOTE — Progress Notes (Signed)
    Called to bedside to assess patient. She is here for cath for TAVR work up. Staff at the bedside reports patient being lethargic. Was able to walk into the hospital and check in with son. She is arousable to verbal stimuli and able to answers questions. She is aware she is at the hospital and here for a cath today. Initially stated she was at [facility]. Alert to self. On exam she has no focal deficits. Grip strengths equal bilaterally. No facial droop. Does have weakness in the left leg, but says this is her usual (confirmed with son who was brought to the bedside). Speech is not slurred. Snoring, but awakens when calling her name.  In review of her medication list, does have oxycodone 30mg  tablets q12 as needed. She does report taking this morning. O2 sats hovering in the upper 80s to low 90s. Placed on [place]. Does have hx of COPD and 1ppd smoker. BP stable.   Will update cath MD regarding patient condition, at present have asked that nursing staff continue to monitor for any acute changes in the interim.   Signed, , NP-C 06/01/2020, 7:47 AM

## 2020-06-01 NOTE — Progress Notes (Signed)
Pt arrived unable to walk or carry on a conversation. Extremely lethargic. Unable to undress self. VSS, glucose 249. Eyes glassy. When pressed to talk she will state name and DOB.  I spoke with her son, Tsosie Billing that brought her here today and he said she was acting normal at 6 am / walked to the car on own accord and carried on a conversation but by time they arrived here at 7 am she was very sleepy. He states she gets this way sometimes. Called Dr Sanjuana Kava and Victorino Dike RN/ cath lab to assess pt.  Pt reassessed by Lillia Abed PA, O2 [place] applied 2L. I brought her son in to sit with her, pt continues to sleep.

## 2020-06-01 NOTE — Progress Notes (Signed)
Report called to 6E spoke with [facility]

## 2020-06-02 ENCOUNTER — Encounter (HOSPITAL_COMMUNITY)
Admission: RE | Disposition: A | Discharge status: Home / Self Care | Payer: Self-pay | Source: Home / Self Care | Attending: Internal Medicine

## 2020-06-02 ENCOUNTER — Inpatient Hospital Stay (HOSPITAL_COMMUNITY): Payer: Medicare Other

## 2020-06-02 ENCOUNTER — Telehealth: Payer: Self-pay | Admitting: Physician Assistant

## 2020-06-02 ENCOUNTER — Encounter (HOSPITAL_COMMUNITY): Payer: Self-pay | Admitting: Internal Medicine

## 2020-06-02 ENCOUNTER — Other Ambulatory Visit: Payer: Self-pay

## 2020-06-02 DIAGNOSIS — G9341 Metabolic encephalopathy: Secondary | ICD-10-CM

## 2020-06-02 DIAGNOSIS — Z8709 Personal history of other diseases of the respiratory system: Secondary | ICD-10-CM | POA: Diagnosis not present

## 2020-06-02 DIAGNOSIS — R4020 Unspecified coma: Secondary | ICD-10-CM

## 2020-06-02 DIAGNOSIS — I350 Nonrheumatic aortic (valve) stenosis: Secondary | ICD-10-CM | POA: Diagnosis not present

## 2020-06-02 DIAGNOSIS — I63312 Cerebral infarction due to thrombosis of left middle cerebral artery: Secondary | ICD-10-CM

## 2020-06-02 DIAGNOSIS — I639 Cerebral infarction, unspecified: Secondary | ICD-10-CM | POA: Diagnosis not present

## 2020-06-02 DIAGNOSIS — J9601 Acute respiratory failure with hypoxia: Secondary | ICD-10-CM | POA: Diagnosis not present

## 2020-06-02 LAB — GLUCOSE, CAPILLARY
Glucose-Capillary: 160 mg/dL — ABNORMAL HIGH (ref 70–99)
Glucose-Capillary: 233 mg/dL — ABNORMAL HIGH (ref 70–99)
Glucose-Capillary: 196 mg/dL — ABNORMAL HIGH (ref 70–99)

## 2020-06-02 LAB — BASIC METABOLIC PANEL
Anion gap: 9 (ref 5–15)
BUN: 14 mg/dL (ref 8–23)
CO2: 27 mmol/L (ref 22–32)
Calcium: 8.7 mg/dL — ABNORMAL LOW (ref 8.9–10.3)
Chloride: 96 mmol/L — ABNORMAL LOW (ref 98–111)
Creatinine, Ser: 0.74 mg/dL (ref 0.44–1.00)
GFR, Estimated: >60 mL/min (ref >60)
Glucose, Bld: 204 mg/dL — ABNORMAL HIGH (ref 70–99)
Potassium: 4.1 mmol/L (ref 3.5–5.1)
Sodium: 132 mmol/L — ABNORMAL LOW (ref 135–145)

## 2020-06-02 SURGERY — RIGHT/LEFT HEART CATH AND CORONARY ANGIOGRAPHY
Anesthesia: LOCAL

## 2020-06-02 MED ORDER — ONDANSETRON HCL 4 MG/2ML IJ SOLN
4.0000 mg | Freq: Four times a day (QID) | INTRAMUSCULAR | Status: DC | PRN
  Administered 2020-06-02: 4 mg via INTRAVENOUS
  Filled 2020-06-02: qty 2

## 2020-06-02 MED ORDER — ASPIRIN EC 81 MG PO TBEC: 81.0000 mg | DELAYED_RELEASE_TABLET | Freq: Every day | ORAL | 0 refills | Status: DC

## 2020-06-02 MED ORDER — LORAZEPAM 2 MG/ML IJ SOLN: 1.0000 mg | INTRAMUSCULAR | Status: AC

## 2020-06-02 MED ORDER — ATORVASTATIN CALCIUM 80 MG PO TABS
80.0000 mg | ORAL_TABLET | Freq: Every day | ORAL | 0 refills | Status: DC
Start: 2020-06-03 — End: 2020-06-13

## 2020-06-02 MED ORDER — CLOPIDOGREL BISULFATE 75 MG PO TABS
75.0000 mg | ORAL_TABLET | Freq: Every day | ORAL | 0 refills | Status: DC
Start: 2020-06-03 — End: 2020-06-13

## 2020-06-02 MED ORDER — LORAZEPAM 2 MG/ML IJ SOLN
INTRAMUSCULAR | Status: AC
  Administered 2020-06-02: 1 mg via INTRAVENOUS
  Filled 2020-06-02: qty 1

## 2020-06-02 NOTE — Plan of Care (Signed)
  Problem: Education: Goal: Knowledge of General Education information will improve Description: Including pain rating scale, medication(s)/side effects and non-pharmacologic comfort measures Outcome: Adequate for Discharge   Problem: Health Behavior/Discharge Planning: Goal: Ability to manage health-related needs will improve Outcome: Adequate for Discharge   Problem: Clinical Measurements: Goal: Ability to maintain clinical measurements within normal limits will improve Outcome: Adequate for Discharge Goal: Will remain free from infection Outcome: Adequate for Discharge Goal: Diagnostic test results will improve Outcome: Adequate for Discharge Goal: Respiratory complications will improve Outcome: Adequate for Discharge Goal: Cardiovascular complication will be avoided Outcome: Adequate for Discharge   Problem: Activity: Goal: Risk for activity intolerance will decrease Outcome: Adequate for Discharge   Problem: Nutrition: Goal: Adequate nutrition will be maintained Outcome: Adequate for Discharge   Problem: Coping: Goal: Level of anxiety will decrease Outcome: Adequate for Discharge   Problem: Elimination: Goal: Will not experience complications related to bowel motility Outcome: Adequate for Discharge Goal: Will not experience complications related to urinary retention Outcome: Adequate for Discharge   Problem: Pain Managment: Goal: General experience of comfort will improve Outcome: Adequate for Discharge   Problem: Safety: Goal: Ability to remain free from injury will improve Outcome: Adequate for Discharge   Problem: Skin Integrity: Goal: Risk for impaired skin integrity will decrease Outcome: Adequate for Discharge   Problem: Education: Goal: Knowledge of secondary prevention will improve Outcome: Adequate for Discharge Goal: Knowledge of patient specific risk factors addressed and post discharge goals established will improve Outcome: Adequate for  Discharge Goal: Individualized Educational Video(s) Outcome: Adequate for Discharge

## 2020-06-02 NOTE — Telephone Encounter (Signed)
Currently admitted.

## 2020-06-02 NOTE — Progress Notes (Signed)
   Called by medicine team to assist with arranging an outpatient TEE to further evaluate her CVA. Appointment scheduled with Bettina Gavia, PA-C 06/13/20 at 11:45am to help arrange this outpatient.   Beatriz Stallion, PA-C 06/02/20; 4:14 PM

## 2020-06-02 NOTE — Progress Notes (Signed)
Pt.is more alert now.Swallow screen done & she passed.

## 2020-06-02 NOTE — Telephone Encounter (Signed)
Pt is scheduled for a [facility] hospital f/u per Judy Pimple on 06/13/20 at 11:45 AM with Marylene Land.

## 2020-06-02 NOTE — Discharge Summary (Signed)
Physician Discharge Summary  Kristina Montgomery YIR:[id number] DOB: 1955-03-13 DOA: 06/01/2020  PCP: Galvin Proffer, MD  Admit date: 06/01/2020 Discharge date: 06/02/2020  Admitted From: Home  Disposition: Home    Recommendations for Outpatient Follow-up:  1. Follow up with PCP in 1-2 weeks 2. Outpatient cardiology in 2 weeks for TEE and loop recorder per neurology 3. Follow-up stroke clinic 6 weeks 4. Aspirin and Plavix x21 days followed by aspirin alone 5. Started on atorvastatin 80 mg p.o. daily 6. Needs close follow-up regarding type 2 diabetes mellitus, poorly controlled with A1c 11.1 7. Continue to encourage tobacco cessation  Home Health: No Equipment/Devices: None  Discharge Condition: Stable CODE STATUS: Full code Diet recommendation: Heart healthy/consistent carbohydrate diet  History of present illness: Kristina Montgomery is a 65 year old female with past medical history significant for essential hypertension, hyperlipidemia, CAD, COPD, fibromyalgia, chronic back pain, anxiety/depression who initially presented to [facility] for outpatient cardiac catheterization prior to TAVR.  During the initial evaluation, patient was noted to be significant lethargic and drowsy.  She admitted to taking 30 mg of oxycodone the morning prior to her procedure due to issues with her chronic back pain.  Over the last week, patient admits she has been significantly tired with speech a little more slurred with stumbling intermittently.  She also reports has had an issue with her left leg where she is not able to raise it fully, but reports started about 1 year ago.  Patient denies any headache, no visual changes, no nausea/vomiting, no chest pain, palpitations, no abdominal pain, no diarrhea, no dysuria, no change in sensation.  She admits to smoking 1/2 pack cigarettes per day on average.  Due to symptoms persisting, stat CT scan of the brain without contrast was obtained and notable for an acute CVA of  the mid left occipital lobe.  Code stroke was initiated and patient was evaluated by neurology.  Patient was given Narcan 0.6 mg twice with some improvement of her mentation.  She was not a tPA/thrombectomy candidate as her mentation was thought more likely secondary to her pain medications and less likely due to the area of the stroke.  Labs notable for CBC within normal limits, sodium 130, potassium 5.1, chloride 93, CO2 30, BUN 27, creatinine 0.99.  Glucose 125.  Cardiology deferred any further work-up at this time and requested [facility] hospitalist group to admit for acute CVA and confusion likely secondary to narcotic effect.   Hospital course:  Acute metabolic encephalopathy, POA Patient was noted be confused, with associated malaise and fatigue while she was presenting for outpatient cardiac catheterization.  She was noted to be significantly lethargic.  Images taking oxycodone 30 mg the morning prior to procedure.  Patient was given Narcan with improvement of symptoms.  UDS positive for THC.  ABG without CO2 retention.  Etiology likely multifactorial with acute/subacute CVA and narcotic effect.  Minimize sedatives as able.  Acute/subacute ischemic CVA, suspicious for cardioembolic source Over the last week, patient reports slurred speech, stumbling, fatigue and weakness.  CT head without contrast with acute left mid occipital infarct near the parieto-occipital junction.  MR brain with 3.0 x 2.6 x 3.3 cm cortical/subcortical left parietal lobe infarction, subacute with petechial hemorrhage without mass-effect, 2 small acute/subacute infarcts 12 mm left frontal lobe parietal subcortical white matter.  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 TTE with LVEF 60-65%, grade 1 diastolic dysfunction, LA moderately dilated, IVC within normal limits, mild MR.  Carotid ultrasound with no significant flow-limiting disease.  EEG with no seizures or epileptiform discharges noted.  LDL 114, HDL 46.  Seen by PT/OT with no recommendations.  Neurology was consulted and followed during hospital course.  Given area of CVA, neurology believes possibly embolic in nature.  No arrhythmias noted on telemetry during hospitalization.  Neurology recommends aspirin Plavix x21 days followed by aspirin 81 mg alone.  Atorvastatin 80 mg p.o. daily.  Tobacco cessation.  Outpatient follow-up with cardiology for TEE and loop recorder assess for cardioembolic source.  Next week follow-up with stroke clinic.  Acute respiratory failure with hypoxia COPD Patient was noted to have O2 saturations as low as 86% on room air, subsequently placed on 2 L nasal cannula.  Chest x-ray with cardiomegaly, mild bibasilar atelectasis, otherwise no acute finding.  Not on oxygen at baseline.  Suspect etiology likely secondary to respiratory depression/narcotic effect.  Patient was titrated off submental oxygen this morning.  Evaluation by physical therapy shows no significant desaturations below 88% with ambulation.  Tobacco cessation.  Essential hypertension On amlodipine-olmesartan 10-40 mg p.o. daily at home. --Holding home antihypertensives to allow for permissive hypertension for 24 hours --Hydralazine IV for SBP greater than 220/110  Type 2 diabetes mellitus, with hyperglycemia Hemoglobin A1c 11.1, poorly controlled.  On pioglitazone 45 mg p.o. daily, semaglutide at home.  Seen by dietitian while inpatient and given instruction regarding food choices.  Close outpatient follow-up with PCP for further guidance regarding poorly controlled diabetes.  Chronic back pain On oxycodone 30 mg extended release every 12 hours at home.  Hyperlipidemia Total cholesterol 207, HDL  46, LDL 114, triglycerides 235.  Started on atorvastatin 80 mg p.o. daily  Hypothyroidism: Levothyroxine 25 mcg p.o. daily  Depression: Continue home doxepin 300 mg p.o. nightly and Venlafaxine 50 mg p.o. twice daily  Tobacco and THC use disorder Patient continues to endorse half pack cigarettes per day on average.  Counseled on need for complete cessation of tobacco and THC given acute CVA.  Aortic stenosis Currently being worked up outpatient by cardiology for consideration of TAVR.  Continue outpatient follow-up.  Discharge Diagnoses:  Principal Problem:   CVA (cerebral vascular accident) [facility]) Active Problems:   Aortic stenosis   History of COPD   Type 2 diabetes mellitus with hyperlipidemia (HCC)   Tobacco abuse    Discharge Instructions  Discharge Instructions    Ambulatory referral to Neurology   Complete by: As directed    An appointment is requested in approximately: 6 weeks CVA/stroke clinic follow-up   Diet - low sodium heart healthy   Complete by: As directed    Increase activity slowly   Complete by: As directed      Allergies as of 06/02/2020      Reactions   Codeine    Unknown      Medication List    TAKE these medications   amLODipine-olmesartan 10-40 MG tablet Commonly known as: AZOR Take 1 tablet by mouth daily.   aspirin EC 81 MG tablet Take 1 tablet (81 mg total) by mouth daily. Swallow whole.   atorvastatin 80 MG tablet Commonly known as: LIPITOR Take 1 tablet (80 mg total) by mouth daily. Start taking on: Jun 03, 2020   clopidogrel 75 MG tablet Commonly known as: PLAVIX  Take 1 tablet (75 mg total) by mouth daily for 21 days. Start taking on: Jun 03, 2020   doxepin 75 MG capsule Commonly known as: SINEQUAN Take 300 mg by mouth at bedtime.   levothyroxine 25 MCG tablet Commonly known as: SYNTHROID Take 25 mcg by mouth daily before breakfast.   ondansetron 8 MG disintegrating tablet Commonly known as: ZOFRAN-ODT Take 8 mg  by mouth every 8 (eight) hours as needed for nausea or vomiting.   oxyCODONE 30 MG 12 hr tablet Take 1 tablet by mouth every 12 (twelve) hours.   Ozempic (0.25 or 0.5 MG/DOSE) 2 MG/1.5ML Sopn Generic drug: Semaglutide(0.25 or 0.5MG /DOS) Inject 0.5 mg into the skin once a week.   pioglitazone 45 MG tablet Commonly known as: ACTOS Take 45 mg by mouth daily.   venlafaxine 50 MG tablet Commonly known as: EFFEXOR Take 50 mg by mouth 2 (two) times daily.       Follow-up Information    Hague, Myrene Galas, MD. Schedule an appointment as soon as possible for a visit in 1 week(s).   Specialty: Internal Medicine Contact information: [address] [place] [address] [phone]        Parke Poisson, MD. Schedule an appointment as soon as possible for a visit in 2 week(s).   Specialties: Cardiology, Radiology Contact information: [address] [place] [address] [phone]              Allergies  Allergen Reactions  . Codeine     Unknown    Consultations:  Neurology   Procedures/Studies: CT HEAD WO CONTRAST  Result Date: 06/01/2020 CLINICAL DATA:  Altered mental status EXAM: CT HEAD WITHOUT CONTRAST TECHNIQUE: Contiguous axial images were obtained from the base of the skull through the vertex without intravenous contrast. COMPARISON:  April 28, 2014. FINDINGS: Brain: Ventricles and sulci are normal in size and configuration. No well-defined mass. No hemorrhage, extra-axial fluid collection, midline shift. There is decreased attenuation in the superior midportion left occipital lobe near the left parieto-occipital junction consistent with acute infarct. Elsewhere brain parenchyma appears unremarkable. Vascular: No hyperdense vessel. Foci of calcification noted in the carotid siphon regions. Skull: Bony calvarium appears intact. Sinuses/Orbits: Paranasal sinuses are clear. There is leftward deviation of the nasal septum. Orbits appear symmetric  bilaterally. Other: Mastoid air cells are clear. IMPRESSION: Decreased attenuation in the superior mid left occipital lobe near the parieto-occipital junction, likely due to acute infarct. No similar changes elsewhere. No evident mass on noncontrast study. No hemorrhage. Foci of arterial vascular calcification noted. Deviated nasal septum. These results will be called to the ordering clinician or representative by the Radiologist Assistant, and communication documented in the PACS or [employer]. Electronically Signed   By: Bretta Bang III M.D.   On: 06/01/2020 11:12   MR ANGIO HEAD WO CONTRAST  Result Date: 06/02/2020 CLINICAL DATA:  Stroke, follow-up. EXAM: MRI HEAD WITHOUT CONTRAST MRA HEAD WITHOUT CONTRAST TECHNIQUE: Multiplanar, multi-echo pulse sequences of the brain and surrounding structures were acquired without intravenous contrast. Angiographic images of the Circle of Willis were acquired using MRA technique without intravenous contrast. COMPARISON: No pertinent prior exam. COMPARISON:  Prior CT head 06/01/2020.  Prior CT head 11/06/2004. FINDINGS: MRI HEAD FINDINGS Brain: Mild intermittent motion degradation. Cerebral volume is normal for age. Cortical/subcortical infarct within the left parietal lobe measuring 3.0 x 2.6 x 3.3 cm. Diffusion-weighted hyperintensity is present along the periphery of the infarct. However, there is predominantly corresponding intermediate signal  on the [facility] map. Additionally, there is developing encephalomalacia at this site and this infarct is likely subacute. Petechial hemorrhage is also present at this site. No significant mass effect. There are two acute/early subacute infarcts within the posterior left frontoparietal subcortical white matter. Background moderate multifocal T2/FLAIR hyperintensity within the cerebral white matter, nonspecific but compatible with chronic small vessel ischemic disease. No evidence of intracranial mass. No extra-axial fluid  collection. No midline shift. Vascular: Reported below. Skull and upper cervical spine: No focal marrow lesion. Incompletely assessed cervical spondylosis. Sinuses/Orbits: Visualized orbits show no acute finding. No significant paranasal sinus disease. Other: Trace fluid within the left mastoid air cells. MRA HEAD FINDINGS Anterior circulation: The intracranial internal carotid arteries are patent. Mild atherosclerotic irregularity of both vessels without significant stenosis. The M1 middle cerebral arteries are patent. No M2 proximal branch occlusion is identified. Severe stenosis within a proximal to mid left M2 MCA branch (series 353, image 6). The anterior cerebral arteries are patent. Moderate stenosis at the origin of the A1 right anterior cerebral artery (series 353, image 15). 2-3 mm anteromedially projecting vascular protrusion arising from the proximal right cavernous ICA compatible with aneurysm (series 453, image 232). 1-2 mm inferiorly projecting vascular protrusion arising from the supraclinoid right ICA, which may reflect an aneurysm or infundibulum (series 453, image 238). 2 mm vascular protrusion projecting anteriorly from the cavernous left ICA, suspicious for aneurysm (series 3, image 68) (series 453, image 199). 1 mm vascular protrusion projecting medially from the cavernous left ICA, which could reflect an additional aneurysm (series 3, image 66). Posterior circulation: The intracranial vertebral arteries are patent. The basilar artery is patent. The posterior cerebral arteries are patent. Hypoplastic left P1 segment with sizable left posterior communicating artery. Anatomic variants: As described IMPRESSION: MRI brain: 1. 3.0 x 2.6 x 3.3 cm cortical/subcortical left parietal lobe infarct, as described and likely subacute. Petechial hemorrhage is present at this site. No significant mass effect. 2. Two acute/early subacute infarcts measuring up to 12 mm within the left frontoparietal subcortical  white matter. 3. Background moderate cerebral white matter chronic small vessel ischemic disease. MRA head: 1. No intracranial large vessel occlusion. 2. Severe stenosis within a proximal to mid left M2 MCA vessel. 3. Mild atherosclerotic irregularity of the intracranial internal carotid arteries bilaterally. 4. 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 5. 1-2 mm inferiorly projecting vascular protrusion arising from the supraclinoid right ICA, which may reflect an aneurysm or infundibulum. 6. 2 mm vascular protrusion projecting anteriorly from the cavernous left ICA, suspicious for aneurysm. 7. 1 mm vascular protrusion projecting medially from the cavernous left ICA, which could reflect an additional small aneurysm. Electronically Signed   By: Jackey Loge DO   On: 06/02/2020 13:44   MR BRAIN WO CONTRAST  Result Date: 06/02/2020 CLINICAL DATA:  Stroke, follow-up. EXAM: MRI HEAD WITHOUT CONTRAST MRA HEAD WITHOUT CONTRAST TECHNIQUE: Multiplanar, multi-echo pulse sequences of the brain and surrounding structures were acquired without intravenous contrast. Angiographic images of the Circle of Willis were acquired using MRA technique without intravenous contrast. COMPARISON: No pertinent prior exam. COMPARISON:  Prior CT head 06/01/2020.  Prior CT head 11/06/2004. FINDINGS: MRI HEAD FINDINGS Brain: Mild intermittent motion degradation. Cerebral volume is normal for age. Cortical/subcortical infarct within the left parietal lobe measuring 3.0 x 2.6 x 3.3 cm. Diffusion-weighted hyperintensity is present along the periphery of the infarct. However, there is predominantly corresponding intermediate signal on the [facility]  map. Additionally, there is developing encephalomalacia at this site and this infarct is likely subacute. Petechial hemorrhage is also present at this site. No significant mass effect. There are two acute/early subacute  infarcts within the posterior left frontoparietal subcortical white matter. Background moderate multifocal T2/FLAIR hyperintensity within the cerebral white matter, nonspecific but compatible with chronic small vessel ischemic disease. No evidence of intracranial mass. No extra-axial fluid collection. No midline shift. Vascular: Reported below. Skull and upper cervical spine: No focal marrow lesion. Incompletely assessed cervical spondylosis. Sinuses/Orbits: Visualized orbits show no acute finding. No significant paranasal sinus disease. Other: Trace fluid within the left mastoid air cells. MRA HEAD FINDINGS Anterior circulation: The intracranial internal carotid arteries are patent. Mild atherosclerotic irregularity of both vessels without significant stenosis. The M1 middle cerebral arteries are patent. No M2 proximal branch occlusion is identified. Severe stenosis within a proximal to mid left M2 MCA branch (series 353, image 6). The anterior cerebral arteries are patent. Moderate stenosis at the origin of the A1 right anterior cerebral artery (series 353, image 15). 2-3 mm anteromedially projecting vascular protrusion arising from the proximal right cavernous ICA compatible with aneurysm (series 453, image 232). 1-2 mm inferiorly projecting vascular protrusion arising from the supraclinoid right ICA, which may reflect an aneurysm or infundibulum (series 453, image 238). 2 mm vascular protrusion projecting anteriorly from the cavernous left ICA, suspicious for aneurysm (series 3, image 68) (series 453, image 199). 1 mm vascular protrusion projecting medially from the cavernous left ICA, which could reflect an additional aneurysm (series 3, image 66). Posterior circulation: The intracranial vertebral arteries are patent. The basilar artery is patent. The posterior cerebral arteries are patent. Hypoplastic left P1 segment with sizable left posterior communicating artery. Anatomic variants: As described IMPRESSION:  MRI brain: 1. 3.0 x 2.6 x 3.3 cm cortical/subcortical left parietal lobe infarct, as described and likely subacute. Petechial hemorrhage is present at this site. No significant mass effect. 2. Two acute/early subacute infarcts measuring up to 12 mm within the left frontoparietal subcortical white matter. 3. Background moderate cerebral white matter chronic small vessel ischemic disease. MRA head: 1. No intracranial large vessel occlusion. 2. Severe stenosis within a proximal to mid left M2 MCA vessel. 3. Mild atherosclerotic irregularity of the intracranial internal carotid arteries bilaterally. 4. 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 5. 1-2 mm inferiorly projecting vascular protrusion arising from the supraclinoid right ICA, which may reflect an aneurysm or infundibulum. 6. 2 mm vascular protrusion projecting anteriorly from the cavernous left ICA, suspicious for aneurysm. 7. 1 mm vascular protrusion projecting medially from the cavernous left ICA, which could reflect an additional small aneurysm. Electronically Signed   By: Jackey Loge DO   On: 06/02/2020 13:44   DG CHEST PORT 1 VIEW  Result Date: 06/01/2020 CLINICAL DATA:  Lethargic EXAM: PORTABLE CHEST 1 VIEW COMPARISON:  Chest radiograph dated 03/21/2016 and CT chest dated 11/05/2018. FINDINGS: The heart is enlarged. There is mild bibasilar atelectasis/airspace disease. There is no pleural effusion or pneumothorax. Degenerative changes are seen in the spine. IMPRESSION: Mild bibasilar atelectasis/airspace disease.  Cardiomegaly. Electronically Signed   By: Romona Curls M.D.   On: 06/01/2020 15:41   EEG adult  Result Date: 06/02/2020 Charlsie Quest, MD     06/02/2020 12:56 PM Patient Name: Kristina Montgomery MRN: [id number] Epilepsy Attending: Charlsie Quest Referring Physician/Provider: Clemon Chambers, PA Date: 06/02/2020 Duration: 23.33 mins Patient history:  65 year old female  with acute left parieto-occipital stroke as well as altered mental status.  EEG done for seizures. Level of alertness: Awake AEDs during EEG study: None Technical aspects: This EEG study was done with scalp electrodes positioned according to the 10-20 International system of electrode placement. Electrical activity was acquired at a sampling rate of 500Hz  and reviewed with a high frequency filter of 70Hz  and a low frequency filter of 1Hz . EEG data were recorded continuously and digitally stored. Description: The posterior dominant rhythm consists of 8 Hz activity of moderate voltage (25-35 uV) seen predominantly in posterior head regions, symmetric and reactive to eye opening and eye closing.  Physiologic photic driving was not seen during photic stimulation.  Hyperventilation was not performed.   IMPRESSION: This study is within normal limits. No seizures or epileptiform discharges were seen throughout the recording.   ECHOCARDIOGRAM COMPLETE  Result Date: 06/01/2020    ECHOCARDIOGRAM REPORT   Patient Name:   Kristina Montgomery Date of Exam: 06/01/2020 Medical Rec #:  08/01/2020       Height:       65.0 in Accession #:    [id number]      Weight:       184.3 lb Date of Birth:  Jan 07, 1956       BSA:          1.911 m Patient Age:    65 years        BP:           114/68 mmHg Patient Gender: F               HR:           85 bpm. Exam Location:  Inpatient Procedure: 2D Echo, Cardiac Doppler and Color Doppler Indications:    CVA  History:        Patient has prior history of Echocardiogram examinations, most                 recent 05/03/2020. CAD, COPD, Signs/Symptoms:Murmur; Risk                 Factors:Dyslipidemia, Diabetes and Hypertension.  Sonographer:    [id number] RDCS Referring Phys: 04/10/1953 RONDELL A SMITH  Sonographer Comments: Technically challenging study due to limited acoustic windows and patient is morbidly obese. IMPRESSIONS  1. Left ventricular ejection fraction, by  estimation, is 60 to 65%. The left ventricle has normal function. The left ventricle has no regional wall motion abnormalities. There is moderate left ventricular hypertrophy. Left ventricular diastolic parameters are consistent with Grade I diastolic dysfunction (impaired relaxation).  2. Right ventricular systolic function is normal. The right ventricular size is normal. There is normal pulmonary artery systolic pressure.  3. Left atrial size was moderately dilated.  4. The mitral valve is grossly normal. Mild mitral valve regurgitation.  5. The aortic valve was not well visualized. Aortic valve regurgitation is trivial.  6. The inferior vena cava is normal in size with greater than 50% respiratory variability, suggesting right atrial pressure of 3 mmHg. FINDINGS  Left Ventricle: Left ventricular ejection fraction, by estimation, is 60 to 65%. The left ventricle has normal function. The left ventricle has no regional wall motion abnormalities. The left ventricular internal cavity size was normal in size. There is  moderate left ventricular hypertrophy. Left ventricular diastolic parameters are consistent with Grade I diastolic dysfunction (impaired relaxation). Right Ventricle: The right ventricular size is normal. No increase in right ventricular wall thickness. Right ventricular  systolic function is normal. There is normal pulmonary artery systolic pressure. The tricuspid regurgitant velocity is 2.62 m/s, and  with an assumed right atrial pressure of 3 mmHg, the estimated right ventricular systolic pressure is 30.5 mmHg. Left Atrium: Left atrial size was moderately dilated. Right Atrium: Right atrial size was normal in size. Pericardium: There is no evidence of pericardial effusion. Mitral Valve: The mitral valve is grossly normal. Mild mitral valve regurgitation. MV peak gradient, 12.4 mmHg. The mean mitral valve gradient is 5.0 mmHg. Tricuspid Valve: The tricuspid valve is not well visualized. Tricuspid valve  regurgitation is not demonstrated. Aortic Valve: The aortic valve was not well visualized. Aortic valve regurgitation is trivial. Aortic regurgitation PHT measures 279 msec. Aortic valve mean gradient measures 29.3 mmHg. Aortic valve peak gradient measures 53.8 mmHg. Aortic valve area, by  VTI measures 1.29 cm. Pulmonic Valve: The pulmonic valve was not well visualized. Pulmonic valve regurgitation is not visualized. Aorta: The aortic root and ascending aorta are structurally normal, with no evidence of dilitation. Venous: The inferior vena cava is normal in size with greater than 50% respiratory variability, suggesting right atrial pressure of 3 mmHg. IAS/Shunts: The atrial septum is grossly normal.  LEFT VENTRICLE PLAX 2D LVIDd:         4.90 cm     Diastology LVIDs:         3.30 cm     LV e' medial:    4.38 cm/s LV PW:         1.70 cm     LV E/e' medial:  23.1 LV IVS:        1.40 cm     LV e' lateral:   4.38 cm/s LVOT diam:     2.40 cm     LV E/e' lateral: 23.1 LV SV:         95 LV SV Index:   50 LVOT Area:     4.52 cm  LV Volumes (MOD) LV vol d, MOD A2C: 46.0 ml LV vol d, MOD A4C: 94.4 ml LV vol s, MOD A2C: 30.9 ml LV vol s, MOD A4C: 36.8 ml LV SV MOD A2C:     15.1 ml LV SV MOD A4C:     94.4 ml LV SV MOD BP:      31.6 ml RIGHT VENTRICLE RV S prime:     14.70 cm/s TAPSE (M-mode): 1.6 cm LEFT ATRIUM             Index       RIGHT ATRIUM           Index LA diam:        4.10 cm 2.15 cm/m  RA Area:     12.20 cm LA Vol (A2C):   91.8 ml 48.05 ml/m RA Volume:   24.70 ml  12.93 ml/m LA Vol (A4C):   60.7 ml 31.77 ml/m LA Biplane Vol: 76.8 ml 40.20 ml/m  AORTIC VALVE                    PULMONIC VALVE AV Area (Vmax):    1.41 cm     PV Vmax:       2.10 m/s AV Area (Vmean):   1.27 cm     PV Vmean:      220.000 cm/s AV Area (VTI):     1.29 cm     PV VTI:        0.394 m AV Vmax:  366.67 cm/s  PV Peak grad:  17.6 mmHg AV Vmean:          246.000 cm/s PV Mean grad:  21.0 mmHg AV VTI:            0.739 m AV Peak  Grad:      53.8 mmHg AV Mean Grad:      29.3 mmHg LVOT Vmax:         114.00 cm/s LVOT Vmean:        69.300 cm/s LVOT VTI:          0.211 m LVOT/AV VTI ratio: 0.29 AI PHT:            279 msec  AORTA Ao Root diam: 2.80 cm Ao Asc diam:  3.10 cm MITRAL VALVE                TRICUSPID VALVE MV Area (PHT): 4.21 cm     TR Peak grad:   27.5 mmHg MV Area VTI:   2.90 cm     TR Vmax:        262.00 cm/s MV Peak grad:  12.4 mmHg MV Mean grad:  5.0 mmHg     SHUNTS MV Vmax:       1.76 m/s     Systemic VTI:  0.21 m MV Vmean:      99.1 cm/s    Systemic Diam: 2.40 cm MV Decel Time: 180 msec MR Peak grad: 93.3 mmHg MR Mean grad: 62.0 mmHg MR Vmax:      483.00 cm/s MR Vmean:     376.0 cm/s MV E velocity: 101.00 cm/s MV A velocity: 162.00 cm/s MV E/A ratio:  0.62 Kristeen Miss MD Electronically signed by Kristeen Miss MD Signature Date/Time: 06/01/2020/4:42:14 PM    Final    VAS US CAROTID (at [facility] and [facility] only)  Result Date: 06/02/2020 Carotid Arterial Duplex Study Patient Name:  Kristina Montgomery  Date of Exam:   06/01/2020 Medical Rec #: [id number]        Accession #:    [id number] Date of Birth: Feb 26, 1955        Patient Gender: F Patient Age:   65Y Exam [facility]:  [facility] Procedure:      VAS US CAROTID Referring Phys: [id number] RONDELL A SMITH --------------------------------------------------------------------------------  Indications:       CVA. Risk Factors:      Hypertension, hyperlipidemia, Diabetes, current smoker,                    coronary artery disease. Comparison Study:  No prior studies. Performing Technologist: Jean Rosenthal RDMS,RVT  Examination Guidelines: A complete evaluation includes B-mode imaging, spectral Doppler, color Doppler, and power Doppler as needed of all accessible portions of each vessel. Bilateral testing is considered an integral part of a complete examination. Limited examinations for reoccurring indications may be performed as noted.  Right Carotid Findings:  +----------+--------+--------+--------+-------------------------+--------+           PSV cm/sEDV cm/sStenosisPlaque Description       Comments +----------+--------+--------+--------+-------------------------+--------+ CCA Prox  90      22                                                +----------+--------+--------+--------+-------------------------+--------+ CCA Distal71      20  calcific                          +----------+--------+--------+--------+-------------------------+--------+ ICA Prox  71      26      1-39%   focal and calcific                +----------+--------+--------+--------+-------------------------+--------+ ICA Distal109     44                                                +----------+--------+--------+--------+-------------------------+--------+ ECA       288     25      Occludedcalcific and heterogenous         +----------+--------+--------+--------+-------------------------+--------+ +----------+--------+-------+----------------+-------------------+           PSV cm/sEDV cmsDescribe        Arm Pressure (mmHG) +----------+--------+-------+----------------+-------------------+ [id number]            Multiphasic, WNL                    +----------+--------+-------+----------------+-------------------+ +---------+--------+--+--------+--+---------+ VertebralPSV cm/s64EDV cm/s22Antegrade +---------+--------+--+--------+--+---------+  Left Carotid Findings: +----------+--------+--------+--------+------------------+--------+           PSV cm/sEDV cm/sStenosisPlaque DescriptionComments +----------+--------+--------+--------+------------------+--------+ CCA Prox  134     35                                         +----------+--------+--------+--------+------------------+--------+ CCA Distal89      25              heterogenous               +----------+--------+--------+--------+------------------+--------+  ICA Prox  109     47      1-39%   heterogenous               +----------+--------+--------+--------+------------------+--------+ ICA Mid   117     54                                         +----------+--------+--------+--------+------------------+--------+ ICA Distal126     55                                         +----------+--------+--------+--------+------------------+--------+ ECA       133     16              calcific                   +----------+--------+--------+--------+------------------+--------+ +----------+--------+--------+----------------+-------------------+           PSV cm/sEDV cm/sDescribe        Arm Pressure (mmHG) +----------+--------+--------+----------------+-------------------+ [id number]             Multiphasic, WNL                    +----------+--------+--------+----------------+-------------------+ +---------+--------+--+--------+--+---------+ VertebralPSV cm/s64EDV cm/s20Antegrade +---------+--------+--+--------+--+---------+   Summary: Right Carotid: Velocities in the right ICA are consistent with a 1-39% stenosis.                The ECA appears >50% stenosed.  Left Carotid: Velocities in the left ICA are consistent with a 1-39% stenosis. Vertebrals:  Bilateral vertebral arteries demonstrate antegrade flow. Subclavians: Normal flow hemodynamics were seen in bilateral subclavian              arteries. *See table(s) above for measurements and observations.  Electronically signed by Delia Heady MD on 06/02/2020 at 11:01:56 AM.    Final       Subjective: Patient seen and examined bedside, resting comfortably.  Sounds present.  Confusion has resolved.  Okay for discharge home per neurology with outpatient follow-up with cardiology for TEE/loop recorder.  Patient with no other concerns or complaints or questions at this time.  Denies headache, no visual changes, no dizziness, no fever/chills/night sweats, no nausea/vomiting/diarrhea,  no chest pain, no palpitations, no shortness of breath, no abdominal pain, no weakness, no fatigue, no paresthesias.  No acute events overnight per nursing staff.  Discharge Exam: Vitals:   06/02/20 0757 06/02/20 1315  BP: (!) 152/76 (!) 153/66  Pulse: (!) 109   Resp: 20   Temp: 98 F (36.7 C) 97.8 F (36.6 C)  SpO2: 94%    Vitals:   06/02/20 0235 06/02/20 0432 06/02/20 0757 06/02/20 1315  BP: (!) 166/77 (!) 188/75 (!) 152/76 (!) 153/66  Pulse: 100 98 (!) 109   Resp: Temp: 98.3 F (36.8 C) 98.2 F (36.8 C) 98 F (36.7 C) 97.8 F (36.6 C)  TempSrc: Oral Oral Oral Oral  SpO2: 91% 96% 94%   Weight:      Height:        General: Pt is alert, awake, not in acute distress, appears older than stated age Cardiovascular: RRR, S1/S2 +, 3/69 SEM RUSB, no rubs, no gallops Respiratory: CTA bilaterally, no wheezing, no rhonchi, on room air Abdominal: Soft, NT, ND, bowel sounds + Extremities: no edema, no cyanosis    The results of significant diagnostics from this hospitalization (including imaging, microbiology, ancillary and laboratory) are listed below for reference.     Microbiology: Recent Results (from the past 240 hour(s))  SARS CORONAVIRUS 2 (TAT 6-24 HRS) Nasopharyngeal Nasopharyngeal Swab     Status: None   Collection Time: 05/30/20 12:37 PM   Specimen: Nasopharyngeal Swab  Result Value Ref Range Status   SARS Coronavirus 2 NEGATIVE NEGATIVE Final    Comment: (NOTE) SARS-CoV-2 target nucleic acids are NOT DETECTED.  The SARS-CoV-2 RNA is generally detectable in upper and lower respiratory specimens during the acute phase of infection. Negative results do not preclude SARS-CoV-2 infection, do not rule out co-infections with other pathogens, and should not be used as the sole basis for treatment or other patient management decisions. Negative results must be combined with clinical observations, patient history, and epidemiological information. The  expected result is Negative.  Fact Sheet for Patients: HairSlick.no  Fact Sheet for Healthcare Providers: quierodirigir.com  This test is not yet approved or cleared by the [place] FDA and  has been authorized for detection and/or diagnosis of SARS-CoV-2 by FDA under an Emergency Use Authorization (EUA). This EUA will remain  in effect (meaning this test can be used) for the duration of the COVID-19 declaration under Se ction 564(b)(1) of the Act, 21 U.S.C. section 360bbb-3(b)(1), unless the authorization is terminated or revoked sooner.  Performed at [facility], [address], [place], [place] [address]      Labs: BNP (last 3 results) No results for input(s): BNP in the last 8760 hours. Basic  Metabolic Panel: Recent Labs  Lab 06/01/20 0701 06/02/20 0756  NA 130* 132*  K 5.1 4.1  CL 93* 96*  CO2 30 27  GLUCOSE 125* 204*  BUN 27* 14  CREATININE 0.99 0.74  CALCIUM 9.3 8.7*   Liver Function Tests: No results for input(s): AST, ALT, ALKPHOS, BILITOT, PROT, ALBUMIN in the last 168 hours. No results for input(s): LIPASE, AMYLASE in the last 168 hours. Recent Labs  Lab 06/01/20 1836  AMMONIA 20   CBC: Recent Labs  Lab 06/01/20 1143  WBC 9.5  HGB 14.4  HCT 43.4  MCV 92.5  PLT 265   Cardiac Enzymes: No results for input(s): CKTOTAL, CKMB, CKMBINDEX, TROPONINI in the last 168 hours. BNP: Invalid input(s): POCBNP CBG: Recent Labs  Lab 06/01/20 1743 06/01/20 2108 06/02/20 0420 06/02/20 0757 06/02/20 1312  GLUCAP 130* 115* 160* 233* 196*   D-Dimer No results for input(s): DDIMER in the last 72 hours. Hgb A1c Recent Labs    06/01/20 1214  HGBA1C 11.1*   Lipid Profile Recent Labs    06/01/20 1404  CHOL 207*  HDL 46  LDLCALC 114*  TRIG 235*  CHOLHDL 4.5   Thyroid function studies No results for input(s): TSH, T4TOTAL, T3FREE, THYROIDAB in the last 72 hours.  Invalid  input(s): FREET3 Anemia work up No results for input(s): VITAMINB12, FOLATE, FERRITIN, TIBC, IRON, RETICCTPCT in the last 72 hours. Urinalysis No results found for: COLORURINE, APPEARANCEUR, LABSPEC, PHURINE, GLUCOSEU, HGBUR, BILIRUBINUR, KETONESUR, PROTEINUR, UROBILINOGEN, NITRITE, LEUKOCYTESUR Sepsis Labs Invalid input(s): PROCALCITONIN,  WBC,  LACTICIDVEN Microbiology Recent Results (from the past 240 hour(s))  SARS CORONAVIRUS 2 (TAT 6-24 HRS) Nasopharyngeal Nasopharyngeal Swab     Status: None   Collection Time: 05/30/20 12:37 PM   Specimen: Nasopharyngeal Swab  Result Value Ref Range Status   SARS Coronavirus 2 NEGATIVE NEGATIVE Final    Comment: (NOTE) SARS-CoV-2 target nucleic acids are NOT DETECTED.  The SARS-CoV-2 RNA is generally detectable in upper and lower respiratory specimens during the acute phase of infection. Negative results do not preclude SARS-CoV-2 infection, do not rule out co-infections with other pathogens, and should not be used as the sole basis for treatment or other patient management decisions. Negative results must be combined with clinical observations, patient history, and epidemiological information. The expected result is Negative.  Fact Sheet for Patients: HairSlick.nohttps://www.fda.gov/media/138098/download  Fact Sheet for Healthcare Providers: quierodirigir.comhttps://www.fda.gov/media/138095/download  This test is not yet approved or cleared by the [place] FDA and  has been authorized for detection and/or diagnosis of SARS-CoV-2 by FDA under an Emergency Use Authorization (EUA). This EUA will remain  in effect (meaning this test can be used) for the duration of the COVID-19 declaration under Se ction 564(b)(1) of the Act, 21 U.S.C. section 360bbb-3(b)(1), unless the authorization is terminated or revoked sooner.  Performed at [facility], [address] [place], [place] [id number]      Time coordinating discharge: Over 30  minutes  SIGNED:   Alvira PhilipsEric J [place], DO  [facility] 06/02/2020, 2:57 PM

## 2020-06-02 NOTE — Progress Notes (Signed)
Inpatient Diabetes Program Recommendations  AACE/ADA: New Consensus Statement on Inpatient Glycemic Control (2015)  Target Ranges:  Prepandial:   less than 140 mg/dL      Peak postprandial:   less than 180 mg/dL (1-2 hours)      Critically ill patients:  140 - 180 mg/dL   Lab Results  Component Value Date   GLUCAP 196 (H) 06/02/2020   HGBA1C 11.1 (H) 06/01/2020    Review of Glycemic Control  Diabetes history: type 2 Outpatient Diabetes medications: Ozempic 0.5 mg weekly, Actos 45 mg daily Current orders for Inpatient glycemic control: Novolog SENSITIVE correction scale TID  Inpatient Diabetes Program Recommendations:   Spoke with patient about her diabetes. States that her HgbA1C is better now. It used to be 13% and is down now to 11.1%. states that she has lost weight with Ozempic and has only been on it for about 2-3 months. States that she tries to eat healthy, but does eat out some. 2 of her sons were in the room and they are very supportive. She does not check her blood sugars very often, mainly when she thinks about it. Encouraged her to check at least every day.   Smith Mince RN BSN CDE Diabetes Coordinator Pager: [phone]  8am-5pm

## 2020-06-02 NOTE — Plan of Care (Signed)
  RD consulted for nutrition education regarding diabetes.  Spoke with pt (who had trouble finding words at times) and 2 sons. Per sons pt lives with one sons father, they are not married. Kids help with shopping and both pt and father cook. Per pt she has reduced her blood sugar somewhat after making changes while working with the RD at her doctor's office.  She drinks black coffee, water, and a sip or 2 of pepsi.   Lab Results  Component Value Date   HGBA1C 11.1 (H) 06/01/2020    RD provided "Carbohydrate Counting for People with Diabetes" handout from the Academy of Nutrition and Dietetics. Discussed different food groups and their effects on blood sugar, emphasizing carbohydrate-containing foods. Provided list of carbohydrates and recommended serving sizes of common foods.  Discussed importance of controlled and consistent carbohydrate intake throughout the day. Teach back method used.  Expect fair compliance.  Body mass index is 30.67 kg/m. Pt meets criteria for obesity based on current BMI.  Current diet order is Carb Modified, patient is consuming approximately 100% of meals at this time. Labs and medications reviewed. No further nutrition interventions warranted at this time. RD contact information provided. If additional nutrition issues arise, please re-consult RD.  Cammy Copa., RD, LDN, CNSC See AMiON for contact information

## 2020-06-02 NOTE — Progress Notes (Addendum)
STROKE TEAM PROGRESS NOTE   INTERVAL HISTORY No one is at the bedside.   Pt on way to MRI. Has no complaints  Ct shows subacute Lt pareital cortical infarct.  Vitals:   06/02/20 0235 06/02/20 0432 06/02/20 0757 06/02/20 1315  BP: (!) 166/77 (!) 188/75 (!) 152/76 (!) 153/66  Pulse: 100 98 (!) 109   Resp: 15 15 20    Temp: 98.3 F (36.8 C) 98.2 F (36.8 C) 98 F (36.7 C) 97.8 F (36.6 C)  TempSrc: Oral Oral Oral Oral  SpO2: 91% 96% 94%   Weight:      Height:       CBC:  Recent Labs  Lab 06/01/20 1143  WBC 9.5  HGB 14.4  HCT 43.4  MCV 92.5  PLT 265   Basic Metabolic Panel:  Recent Labs  Lab 06/01/20 0701 06/02/20 0756  NA 130* 132*  K 5.1 4.1  CL 93* 96*  CO2 30 27  GLUCOSE 125* 204*  BUN 27* 14  CREATININE 0.99 0.74  CALCIUM 9.3 8.7*    Lipid Panel:  Recent Labs  Lab 06/01/20 1404  CHOL 207*  TRIG 235*  HDL 46  CHOLHDL 4.5  VLDL 47*  LDLCALC 114*    HgbA1c:  Recent Labs  Lab 06/01/20 1214  HGBA1C 11.1*   Urine Drug Screen:  Recent Labs  Lab 06/01/20 1830  LABOPIA NONE DETECTED  COCAINSCRNUR NONE DETECTED  LABBENZ NONE DETECTED  AMPHETMU NONE DETECTED  THCU POSITIVE*  LABBARB NONE DETECTED    Alcohol Level No results for input(s): ETH in the last 168 hours.  IMAGING past 24 hours MR ANGIO HEAD WO CONTRAST  Result Date: 06/02/2020 CLINICAL DATA:  Stroke, follow-up. EXAM: MRI HEAD WITHOUT CONTRAST MRA HEAD WITHOUT CONTRAST TECHNIQUE: Multiplanar, multi-echo pulse sequences of the brain and surrounding structures were acquired without intravenous contrast. Angiographic images of the Circle of Willis were acquired using MRA technique without intravenous contrast. COMPARISON: No pertinent prior exam. COMPARISON:  Prior CT head 06/01/2020.  Prior CT head 11/06/2004. FINDINGS: MRI HEAD FINDINGS Brain: Mild intermittent motion degradation. Cerebral volume is normal for age. Cortical/subcortical infarct within the left parietal lobe measuring 3.0 x 2.6  x 3.3 cm. Diffusion-weighted hyperintensity is present along the periphery of the infarct. However, there is predominantly corresponding intermediate signal on the ADC map. Additionally, there is developing encephalomalacia at this site and this infarct is likely subacute. Petechial hemorrhage is also present at this site. No significant mass effect. There are two acute/early subacute infarcts within the posterior left frontoparietal subcortical white matter. Background moderate multifocal T2/FLAIR hyperintensity within the cerebral white matter, nonspecific but compatible with chronic small vessel ischemic disease. No evidence of intracranial mass. No extra-axial fluid collection. No midline shift. Vascular: Reported below. Skull and upper cervical spine: No focal marrow lesion. Incompletely assessed cervical spondylosis. Sinuses/Orbits: Visualized orbits show no acute finding. No significant paranasal sinus disease. Other: Trace fluid within the left mastoid air cells. MRA HEAD FINDINGS Anterior circulation: The intracranial internal carotid arteries are patent. Mild atherosclerotic irregularity of both vessels without significant stenosis. The M1 middle cerebral arteries are patent. No M2 proximal branch occlusion is identified. Severe stenosis within a proximal to mid left M2 MCA branch (series 353, image 6). The anterior cerebral arteries are patent. Moderate stenosis at the origin of the A1 right anterior cerebral artery (series 353, image 15). 2-3 mm anteromedially projecting vascular protrusion arising from the proximal right cavernous ICA compatible with aneurysm (series 453, image 232).  1-2 mm inferiorly projecting vascular protrusion arising from the supraclinoid right ICA, which may reflect an aneurysm or infundibulum (series 453, image 238). 2 mm vascular protrusion projecting anteriorly from the cavernous left ICA, suspicious for aneurysm (series 3, image 68) (series 453, image 199). 1 mm vascular  protrusion projecting medially from the cavernous left ICA, which could reflect an additional aneurysm (series 3, image 66). Posterior circulation: The intracranial vertebral arteries are patent. The basilar artery is patent. The posterior cerebral arteries are patent. Hypoplastic left P1 segment with sizable left posterior communicating artery. Anatomic variants: As described IMPRESSION: MRI brain: 1. 3.0 x 2.6 x 3.3 cm cortical/subcortical left parietal lobe infarct, as described and likely subacute. Petechial hemorrhage is present at this site. No significant mass effect. 2. Two acute/early subacute infarcts measuring up to 12 mm within the left frontoparietal subcortical white matter. 3. Background moderate cerebral white matter chronic small vessel ischemic disease. MRA head: 1. No intracranial large vessel occlusion. 2. Severe stenosis within a proximal to mid left M2 MCA vessel. 3. Mild atherosclerotic irregularity of the intracranial internal carotid arteries bilaterally. 4. 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 5. 1-2 mm inferiorly projecting vascular protrusion arising from the supraclinoid right ICA, which may reflect an aneurysm or infundibulum. 6. 2 mm vascular protrusion projecting anteriorly from the cavernous left ICA, suspicious for aneurysm. 7. 1 mm vascular protrusion projecting medially from the cavernous left ICA, which could reflect an additional small aneurysm. Electronically Signed   By: Jackey Loge DO   On: 06/02/2020 13:44   MR BRAIN WO CONTRAST  Result Date: 06/02/2020 CLINICAL DATA:  Stroke, follow-up. EXAM: MRI HEAD WITHOUT CONTRAST MRA HEAD WITHOUT CONTRAST TECHNIQUE: Multiplanar, multi-echo pulse sequences of the brain and surrounding structures were acquired without intravenous contrast. Angiographic images of the Circle of Willis were acquired using MRA technique without intravenous contrast.  COMPARISON: No pertinent prior exam. COMPARISON:  Prior CT head 06/01/2020.  Prior CT head 11/06/2004. FINDINGS: MRI HEAD FINDINGS Brain: Mild intermittent motion degradation. Cerebral volume is normal for age. Cortical/subcortical infarct within the left parietal lobe measuring 3.0 x 2.6 x 3.3 cm. Diffusion-weighted hyperintensity is present along the periphery of the infarct. However, there is predominantly corresponding intermediate signal on the ADC map. Additionally, there is developing encephalomalacia at this site and this infarct is likely subacute. Petechial hemorrhage is also present at this site. No significant mass effect. There are two acute/early subacute infarcts within the posterior left frontoparietal subcortical white matter. Background moderate multifocal T2/FLAIR hyperintensity within the cerebral white matter, nonspecific but compatible with chronic small vessel ischemic disease. No evidence of intracranial mass. No extra-axial fluid collection. No midline shift. Vascular: Reported below. Skull and upper cervical spine: No focal marrow lesion. Incompletely assessed cervical spondylosis. Sinuses/Orbits: Visualized orbits show no acute finding. No significant paranasal sinus disease. Other: Trace fluid within the left mastoid air cells. MRA HEAD FINDINGS Anterior circulation: The intracranial internal carotid arteries are patent. Mild atherosclerotic irregularity of both vessels without significant stenosis. The M1 middle cerebral arteries are patent. No M2 proximal branch occlusion is identified. Severe stenosis within a proximal to mid left M2 MCA branch (series 353, image 6). The anterior cerebral arteries are patent. Moderate stenosis at the origin of the A1 right anterior cerebral artery (series 353, image 15). 2-3 mm anteromedially projecting vascular protrusion arising from the proximal right cavernous ICA compatible with aneurysm (series 453, image 232). 1-2 mm inferiorly  projecting  vascular protrusion arising from the supraclinoid right ICA, which may reflect an aneurysm or infundibulum (series 453, image 238). 2 mm vascular protrusion projecting anteriorly from the cavernous left ICA, suspicious for aneurysm (series 3, image 68) (series 453, image 199). 1 mm vascular protrusion projecting medially from the cavernous left ICA, which could reflect an additional aneurysm (series 3, image 66). Posterior circulation: The intracranial vertebral arteries are patent. The basilar artery is patent. The posterior cerebral arteries are patent. Hypoplastic left P1 segment with sizable left posterior communicating artery. Anatomic variants: As described IMPRESSION: MRI brain: 1. 3.0 x 2.6 x 3.3 cm cortical/subcortical left parietal lobe infarct, as described and likely subacute. Petechial hemorrhage is present at this site. No significant mass effect. 2. Two acute/early subacute infarcts measuring up to 12 mm within the left frontoparietal subcortical white matter. 3. Background moderate cerebral white matter chronic small vessel ischemic disease. MRA head: 1. No intracranial large vessel occlusion. 2. Severe stenosis within a proximal to mid left M2 MCA vessel. 3. Mild atherosclerotic irregularity of the intracranial internal carotid arteries bilaterally. 4. 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 5. 1-2 mm inferiorly projecting vascular protrusion arising from the supraclinoid right ICA, which may reflect an aneurysm or infundibulum. 6. 2 mm vascular protrusion projecting anteriorly from the cavernous left ICA, suspicious for aneurysm. 7. 1 mm vascular protrusion projecting medially from the cavernous left ICA, which could reflect an additional small aneurysm. Electronically Signed   By: Jackey Loge DO   On: 06/02/2020 13:44   DG CHEST PORT 1 VIEW  Result Date: 06/01/2020 CLINICAL DATA:  Lethargic EXAM: PORTABLE  CHEST 1 VIEW COMPARISON:  Chest radiograph dated 03/21/2016 and CT chest dated 11/05/2018. FINDINGS: The heart is enlarged. There is mild bibasilar atelectasis/airspace disease. There is no pleural effusion or pneumothorax. Degenerative changes are seen in the spine. IMPRESSION: Mild bibasilar atelectasis/airspace disease.  Cardiomegaly. Electronically Signed   By: Romona Curls M.D.   On: 06/01/2020 15:41   EEG adult  Result Date: 06/02/2020 Charlsie Quest, MD     06/02/2020 12:56 PM Patient Name: Kristina Montgomery: [id number] Epilepsy Attending: Charlsie Quest Referring Physician/Provider: Clemon Chambers, PA Date: 06/02/2020 Duration: 23.33 mins Patient history: 65 year old female with acute left parieto-occipital stroke as well as altered mental status.  EEG done for seizures. Level of alertness: Awake AEDs during EEG study: None Technical aspects: This EEG study was done with scalp electrodes positioned according to the 10-20 International system of electrode placement. Electrical activity was acquired at a sampling rate of 500Hz  and reviewed with a high frequency filter of 70Hz  and a low frequency filter of 1Hz . EEG data were recorded continuously and digitally stored. Description: The posterior dominant rhythm consists of 8 Hz activity of moderate voltage (25-35 uV) seen predominantly in posterior head regions, symmetric and reactive to eye opening and eye closing.  Physiologic photic driving was not seen during photic stimulation.  Hyperventilation was not performed.   IMPRESSION: This study is within normal limits. No seizures or epileptiform discharges were seen throughout the recording.   ECHOCARDIOGRAM COMPLETE  Result Date: 06/01/2020    ECHOCARDIOGRAM REPORT   Patient Name:   Kristina Montgomery Date of Exam: 06/01/2020 Medical Rec #:  08/01/2020       Height:       65.0 in Accession #:    [id number] Moats      Weight:  184.3 lb Date of Birth:  [birth year]       BSA:          1.911  m Patient Age:    41 years        BP:           114/68 mmHg Patient Gender: F               HR:           85 bpm. Exam Location:  Inpatient Procedure: 2D Echo, Cardiac Doppler and Color Doppler Indications:    CVA  History:        Patient has prior history of Echocardiogram examinations, most                 recent 05/03/2020. CAD, COPD, Signs/Symptoms:Murmur; Risk                 Factors:Dyslipidemia, Diabetes and Hypertension.  Sonographer:    Neomia Dear RDCS Referring Phys: [id number] RONDELL A SMITH  Sonographer Comments: Technically challenging study due to limited acoustic windows and patient is morbidly obese. IMPRESSIONS  1. Left ventricular ejection fraction, by estimation, is 60 to 65%. The left ventricle has normal function. The left ventricle has no regional wall motion abnormalities. There is moderate left ventricular hypertrophy. Left ventricular diastolic parameters are consistent with Grade I diastolic dysfunction (impaired relaxation).  2. Right ventricular systolic function is normal. The right ventricular size is normal. There is normal pulmonary artery systolic pressure.  3. Left atrial size was moderately dilated.  4. The mitral valve is grossly normal. Mild mitral valve regurgitation.  5. The aortic valve was not well visualized. Aortic valve regurgitation is trivial.  6. The inferior vena cava is normal in size with greater than 50% respiratory variability, suggesting right atrial pressure of 3 mmHg. FINDINGS  Left Ventricle: Left ventricular ejection fraction, by estimation, is 60 to 65%. The left ventricle has normal function. The left ventricle has no regional wall motion abnormalities. The left ventricular internal cavity size was normal in size. There is  moderate left ventricular hypertrophy. Left ventricular diastolic parameters are consistent with Grade I diastolic dysfunction (impaired relaxation). Right Ventricle: The right ventricular size is normal. No increase in right ventricular  wall thickness. Right ventricular systolic function is normal. There is normal pulmonary artery systolic pressure. The tricuspid regurgitant velocity is 2.62 m/s, and  with an assumed right atrial pressure of 3 mmHg, the estimated right ventricular systolic pressure is 30.5 mmHg. Left Atrium: Left atrial size was moderately dilated. Right Atrium: Right atrial size was normal in size. Pericardium: There is no evidence of pericardial effusion. Mitral Valve: The mitral valve is grossly normal. Mild mitral valve regurgitation. MV peak gradient, 12.4 mmHg. The mean mitral valve gradient is 5.0 mmHg. Tricuspid Valve: The tricuspid valve is not well visualized. Tricuspid valve regurgitation is not demonstrated. Aortic Valve: The aortic valve was not well visualized. Aortic valve regurgitation is trivial. Aortic regurgitation PHT measures 279 msec. Aortic valve mean gradient measures 29.3 mmHg. Aortic valve peak gradient measures 53.8 mmHg. Aortic valve area, by  VTI measures 1.29 cm. Pulmonic Valve: The pulmonic valve was not well visualized. Pulmonic valve regurgitation is not visualized. Aorta: The aortic root and ascending aorta are structurally normal, with no evidence of dilitation. Venous: The inferior vena cava is normal in size with greater than 50% respiratory variability, suggesting right atrial pressure of 3 mmHg. IAS/Shunts: The atrial septum is grossly normal.  LEFT VENTRICLE PLAX  2D LVIDd:         4.90 cm     Diastology LVIDs:         3.30 cm     LV e' medial:    4.38 cm/s LV PW:         1.70 cm     LV E/e' medial:  23.1 LV IVS:        1.40 cm     LV e' lateral:   4.38 cm/s LVOT diam:     2.40 cm     LV E/e' lateral: 23.1 LV SV:         95 LV SV Index:   50 LVOT Area:     4.52 cm  LV Volumes (MOD) LV vol d, MOD A2C: 46.0 ml LV vol d, MOD A4C: 94.4 ml LV vol s, MOD A2C: 30.9 ml LV vol s, MOD A4C: 36.8 ml LV SV MOD A2C:     15.1 ml LV SV MOD A4C:     94.4 ml LV SV MOD BP:      31.6 ml RIGHT VENTRICLE RV S  prime:     14.70 cm/s TAPSE (M-mode): 1.6 cm LEFT ATRIUM             Index       RIGHT ATRIUM           Index LA diam:        4.10 cm 2.15 cm/m  RA Area:     12.20 cm LA Vol (A2C):   91.8 ml 48.05 ml/m RA Volume:   24.70 ml  12.93 ml/m LA Vol (A4C):   60.7 ml 31.77 ml/m LA Biplane Vol: 76.8 ml 40.20 ml/m  AORTIC VALVE                    PULMONIC VALVE AV Area (Vmax):    1.41 cm     PV Vmax:       2.10 m/s AV Area (Vmean):   1.27 cm     PV Vmean:      220.000 cm/s AV Area (VTI):     1.29 cm     PV VTI:        0.394 m AV Vmax:           366.67 cm/s  PV Peak grad:  17.6 mmHg AV Vmean:          246.000 cm/s PV Mean grad:  21.0 mmHg AV VTI:            0.739 m AV Peak Grad:      53.8 mmHg AV Mean Grad:      29.3 mmHg LVOT Vmax:         114.00 cm/s LVOT Vmean:        69.300 cm/s LVOT VTI:          0.211 m LVOT/AV VTI ratio: 0.29 AI PHT:            279 msec  AORTA Ao Root diam: 2.80 cm Ao Asc diam:  3.10 cm MITRAL VALVE                TRICUSPID VALVE MV Area (PHT): 4.21 cm     TR Peak grad:   27.5 mmHg MV Area VTI:   2.90 cm     TR Vmax:        262.00 cm/s MV Peak grad:  12.4 mmHg MV Mean grad:  5.0 mmHg     SHUNTS  MV Vmax:       1.76 m/s     Systemic VTI:  0.21 m MV Vmean:      99.1 cm/s    Systemic Diam: 2.40 cm MV Decel Time: 180 msec MR Peak grad: 93.3 mmHg MR Mean grad: 62.0 mmHg MR Vmax:      483.00 cm/s MR Vmean:     376.0 cm/s MV E velocity: 101.00 cm/s MV A velocity: 162.00 cm/s MV E/A ratio:  0.62 Kristeen MissPhilip Nahser MD Electronically signed by Kristeen MissPhilip Nahser MD Signature Date/Time: 06/01/2020/4:42:14 PM    Final     PHYSICAL EXAM Frail middle-aged Caucasian lady not in distress. . Afebrile. Head is nontraumatic. Neck is supple without bruit.    Cardiac exam no murmur or gallop. Lungs are clear to auscultation. Distal pulses are well felt. Neurological Exam ;  Awake  Alert oriented x 2.  Diminished attention, registration and recall.. Normal speech and language.eye movements full without nystagmus.fundi  were not visualized. Vision acuity and fields appear normal. Hearing is normal. Palatal movements are normal. Face symmetric. Tongue midline. Normal strength, tone, reflexes and coordination. Normal sensation. Gait deferred.  ASSESSMENT/PLAN Kristina Montgomery is a 65 y.o. female with history of   COPD, coronary artery disease, depression, diabetes, aortic stenosis, hypertension, hyperlipidemia who was electively admitted to the short stay area for cardiac cath for potential TAVR for aortic stenosis.  She took her oxycodone on day of admission prior to coming in. She was noted to be very lethargic and drowsy and cath was cancelled. A STAT CTH was obtained and was notable for an acute stroke in the mid left occipital lobe near the parieto-occipital junction, likey an acute infarct. A stroke code was called with a LKW of [id number].     Multiple left parietal subacute strokes Stroke secondary to left M 2 branch  stenosis.likely of embolic etiology Code Stroke 4/69/625/12/22 CT head Decreased attenuation in the superior mid left occipital lobe near the parieto-occipital junction, likely due to acute infarct. No similar changes elsewhere. No evident mass on noncontrast study. No  Hemorrhage No acute abnormality.      MRI BRAIN 06/02/20  1. 3.0 x 2.6 x 3.3 cm cortical/subcortical left parietal lobe infarct, as described and likely subacute. Petechial hemorrhage is present at this site. No significant mass effect. 2. Two acute/early subacute infarcts measuring up to 12 mm within the left frontoparietal subcortical white matter. 3. Background moderate cerebral white matter chronic small vessel  ischemic disease.  MRA HEAD  06/02/20 1. No intracranial large vessel occlusion. 2. Severe stenosis within a proximal to mid left M2 MCA vessel. 3. Mild atherosclerotic irregularity of the intracranial internal carotid arteries bilaterally. 4. 2-3 mm anteromedially projecting vascular protrusion arising from the  proximal right cavernous ICA compatible with aneurysm. 5. 1-2 mm inferiorly projecting vascular protrusion arising from the supraclinoid right ICA, which may reflect an aneurysm or infundibulum. 6. 2 mm vascular protrusion projecting anteriorly from the cavernous left ICA, suspicious for aneurysm. 7. 1 mm vascular protrusion projecting medially from the cavernous left ICA, which could reflect an additional small fenestration in the middle of both.   Carotid Doppler     Right Carotid: Velocities in the right ICA are consistent with a 1-39%   stenosis.         The ECA appears >50% stenosed.    Left Carotid: Velocities in the left ICA are consistent with a 1-39%   stenosis.   Vertebrals: Bilateral vertebral arteries demonstrate  antegrade flow.  Subclavians: Normal flow hemodynamics were seen in bilateral subclavian        arteries.   2D Echo LVEF 60%. Dilated LA, IA septum normal      LDL 114  HgbA1c 11.1  VTE prophylaxis - lovenox 40     Diet   Diet Carb Modified Fluid consistency: Thin; Room service appropriate? Yes     No antithrombotic prior to admission, now on clopidogrel 75 mg daily.  And aspirin 81 mg for 3 weeks followed by aspirin alone  Patient needs to have loop recorder to assess for cardioembolic source. Can be done outpt  Patient needs TEE done to assess for source of emoblic type strokes. Can be done as outpt.   Therapy recommendations:  No OT follow up  Disposition:  home  Hypertension  Home meds:  none   Stable .  gradually normalize blood pressure in 5-7 days . Long-term BP goal normotensive  Hyperlipidemia  Home meds:    none  LDL 114, goal < 70  Add     High intensity statin lipitor    Continue statin at discharge  Diabetes type II Controlled  Home meds:  Actos, ozempic   HgbA1c 11.1, goal < 7.0  CBGs Recent Labs    06/02/20 0420 06/02/20 0757 06/02/20 1312  GLUCAP 160* 233* 196*       SSI  Other Stroke Risk Factors   Advanced Age >/= 34    Cigarette smoker advised to stop smoking   ETOH use, alcohol level No results found for requested labs within last 11914 hours., advised to drink no more than 1 2 drink(s) a day   Substance abuse - UDS:  THC POSITIVE  Patient advised to stop using due to stroke risk.   Obesity, Body mass index is 30.67 kg/m., BMI >/= 30 associated with increased stroke risk, recommend weight loss, diet and exercise as appropriate      Hospital day # 1 I have personally obtained history,examined this patient, reviewed notes, independently viewed imaging studies, participated in medical decision making and plan of care.ROS completed by me personally and pertinent positives fully documented  I have made any additions or clarifications directly to the above note. Agree with note above.  Patient presented with sleepiness and decreased sensorium without clear preceding history.  Possible she could have had a seizure postictal state due to her embolic left parietal infarct.  Etiology of this appears embolic as there is left M2 distal branch stenosis.  Recommend further evaluation by checking TEE and loop recorder but this can be done as an outpatient if patient is to be discharged.  Recommend aspirin and Plavix for 3 weeks followed by aspirin alone and aggressive risk factor modification.  Discussed with patient and with Dr. [place].  Greater than 50% time during this 35-minute visit was spent in counseling and coordination of care and discussion with care team.  Delia Heady, MD Medical Director [facility] Pager: [phone] 06/02/2020 3:41 PM     To contact Stroke Continuity provider, please refer to [email or web address]. After hours, contact General Neurology

## 2020-06-02 NOTE — Progress Notes (Signed)
SLP Cancellation Note  Patient Details Name: GAYE SCORZA MRN: [id number] DOB: [birth year]   Cancelled treatment:       Reason Eval/Treat Not Completed: Patient at procedure or test/unavailable; ST will continue efforts.   Tressie Stalker , M.S., CCC-SLP 06/02/2020, 1:06 PM

## 2020-06-02 NOTE — Evaluation (Signed)
Occupational Therapy Evaluation Patient Details Name: Kristina Montgomery MRN: [id number] DOB: [birth year] Today's Date: 06/02/2020    History of Present Illness Pt is a 65 y.o. female admitted 06/01/20 to short stay area for cardiac cath for aortic stenosis and potential TAVR. Cath cancelled secondary to lethargy. 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 Per neuro, pt essentially with no new deficit from this stroke. PMH includes asthma, COPD, CAD, DM, HTN, fibromyalgia, asthma, depression.   Clinical Impression   Pt OT evaluation was limited due to transport coming to pick her up for MRI. Standing balance was assessed to ensure safety with dressing and bathing in standing. ROM assessed and WNL, as well as BUE MMT. Ptdoes not appear to have residual weakness and coordination deficits from CVA. Peripheral vision assessed and appears to be at baseline with no field cuts. Pt to return home with family near by, no safety concerns at this time. Acute OT will sign off. Thank you for the referral.    Follow Up Recommendations  No OT follow up    Equipment Recommendations  None recommended by OT    Recommendations for Other Services       Precautions / Restrictions Precautions Precautions: None Restrictions Weight Bearing Restrictions: No      Mobility Bed Mobility Overal bed mobility: Independent             General bed mobility comments: Pt able to sit on flat bed and return to supine with no assistance or difficulties    Transfers Overall transfer level: Independent Equipment used: None             General transfer comment: sit<>stand from recliner x2 and transfer to bed.    Balance Overall balance assessment: No apparent balance deficits (not formally assessed)             Standing balance comment: Able to perform dynamic standing for assessing safety standing in shower at home. No LoB.                            ADL either performed or assessed with clinical judgement   ADL Overall ADL's : Independent;At baseline                                       General ADL Comments: Pt completed ROM to assess ability to bath in standing, able to don and doff socks, no LoB in standing to complete grooming at sink, and transfers to bed from recliner across the room.     Vision Baseline Vision/History: No visual deficits Patient Visual Report: No change from baseline Vision Assessment?: No apparent visual deficits (Checked peripheral, no apparent field cuts)     Perception Perception Perception Tested?: No   Praxis Praxis Praxis tested?: Not tested    Pertinent Vitals/Pain Pain Assessment: No/denies pain     Hand Dominance Right   Extremity/Trunk Assessment Upper Extremity Assessment Upper Extremity Assessment: Overall WFL for tasks assessed (4+/5 BUE, ROM WNL)   Lower Extremity Assessment Lower Extremity Assessment: Defer to PT evaluation LLE Deficits / Details: pt reports chronic LLE issues; knee ext 4/5, knee flex 3/5, hip flex 2/5, ankle 5/5   Cervical / Trunk Assessment Cervical / Trunk Assessment: Normal   Communication Communication Communication: No difficulties   Cognition Arousal/Alertness:  Awake/alert Behavior During Therapy: WFL for tasks assessed/performed Overall Cognitive Status: Within Functional Limits for tasks assessed                                 General Comments: Pt reports baseline memory deficits with recall   General Comments  VSS on RA, O2 remaining around 93% with activity    Exercises     Shoulder Instructions      Home Living Family/patient expects to be discharged to:: Private residence Living Arrangements: Spouse/significant other Available Help at Discharge: Family;Friend(s);Available 24 hours/day Type of Home: House Home Access: Stairs to enter [employer] of Steps:  2 steps Entrance Stairs-Rails: Right Home Layout: One level     Bathroom Shower/Tub: Tub/shower unit;Walk-in shower   Bathroom Toilet: Standard     Home Equipment: Environmental consultant - 2 wheels;Cane - single point   Additional Comments: Lives with "someone" (reports family); also with 4 boys who live nearby and "they help me with as much as I need"      Prior Functioning/Environment Level of Independence: Independent        Comments: Drives. Does not work. Enjoys time with grandkids/great grandkids        OT Problem List: Decreased strength;Decreased activity tolerance;Impaired balance (sitting and/or standing);Impaired vision/perception;Decreased coordination      OT Treatment/Interventions:      OT Goals(Current goals can be found in the care plan section) Acute Rehab OT Goals Patient Stated Goal: Home today OT Goal Formulation: All assessment and education complete, DC therapy Time For Goal Achievement: 06/02/20 Potential to Achieve Goals: Good  OT Frequency:     Barriers to D/C:            Co-evaluation              AM-PAC OT "6 Clicks" Daily Activity     Outcome Measure Help from another person eating meals?: None Help from another person taking care of personal grooming?: None Help from another person toileting, which includes using toliet, bedpan, or urinal?: None Help from another person bathing (including washing, rinsing, drying)?: None Help from another person to put on and taking off regular upper body clothing?: None Help from another person to put on and taking off regular lower body clothing?: None 6 Click Score: 24   End of Session Nurse Communication: Mobility status  Activity Tolerance: Patient tolerated treatment well Patient left: in bed;with call bell/phone within reach;with nursing/sitter in room  OT Visit Diagnosis: Other abnormalities of gait and mobility (R26.89);Muscle weakness (generalized) (M62.81)                Time: 6811-5726 OT  Time Calculation (min): 9 min Charges:  OT General Charges $OT Visit: 1 Visit OT Evaluation $OT Eval Low Complexity: 1 Low  Marlane Hirschmann H., OTR/L Acute Rehabilitation  Deklan Minar Elane Ajaya Crutchfield 06/02/2020, 10:50 AM

## 2020-06-02 NOTE — Discharge Instructions (Signed)
Carbohydrate Counting For People With Diabetes  Foods with carbohydrates make your blood glucose level go up. Learning how to count carbohydrates can help you control your blood glucose levels. First, identify the foods you eat that contain carbohydrates. Then, using the Foods with Carbohydrates chart, determine about how much carbohydrates are in your meals and snacks. Make sure you are eating foods with fiber, protein, and healthy fat along with your carbohydrate foods. Foods with Carbohydrates The following table shows carbohydrate foods that have about 15 grams of carbohydrate each. Using measuring cups, spoons, or a food scale when you first begin learning about carbohydrate counting can help you learn about the portion sizes you typically eat. The following foods have 15 grams carbohydrate each:  Grains . 1 slice bread (1 ounce)  . 1 small tortilla (6-inch size)  .  large bagel (1 ounce)  . 1/3 cup pasta or rice (cooked)  .  hamburger or hot dog bun ( ounce)  .  cup cooked cereal  .  to  cup ready-to-eat cereal  . 2 taco shells (5-inch size) Fruit . 1 small fresh fruit ( to 1 cup)  .  medium banana  . 17 small grapes (3 ounces)  . 1 cup melon or berries  .  cup canned or frozen fruit  . 2 tablespoons dried fruit (blueberries, cherries, cranberries, raisins)  .  cup unsweetened fruit juice  Starchy Vegetables .  cup cooked beans, peas, corn, potatoes/sweet potatoes  .  large baked potato (3 ounces)  . 1 cup acorn or butternut squash  Snack Foods . 3 to 6 crackers  . 8 potato chips or 13 tortilla chips ( ounce to 1 ounce)  . 3 cups popped popcorn  Dairy . 3/4 cup (6 ounces) nonfat plain yogurt, or yogurt with sugar-free sweetener  . 1 cup milk  . 1 cup plain rice, soy, coconut or flavored almond milk Sweets and Desserts .  cup ice cream or frozen yogurt  . 1 tablespoon jam, jelly, pancake syrup, table sugar, or honey  . 2 tablespoons light pancake syrup  . 1 inch  square of frosted cake or 2 inch square of unfrosted cake  . 2 small cookies (2/3 ounce each) or  large cookie  Sometimes you'll have to estimate carbohydrate amounts if you don't know the exact recipe. One cup of mixed foods like soups can have 1 to 2 carbohydrate servings, while some casseroles might have 2 or more servings of carbohydrate. Foods that have less than 20 calories in each serving can be counted as "free" foods. Count 1 cup raw vegetables, or  cup cooked non-starchy vegetables as "free" foods. If you eat 3 or more servings at one meal, then count them as 1 carbohydrate serving.  Foods without Carbohydrates  Not all foods contain carbohydrates. Meat, some dairy, fats, non-starchy vegetables, and many beverages don't contain carbohydrate. So when you count carbohydrates, you can generally exclude chicken, pork, beef, fish, seafood, eggs, tofu, cheese, butter, sour cream, avocado, nuts, seeds, olives, mayonnaise, water, black coffee, unsweetened tea, and zero-calorie drinks. Vegetables with no or low carbohydrate include green beans, cauliflower, tomatoes, and onions. How much carbohydrate should I eat at each meal?  Carbohydrate counting can help you plan your meals and manage your weight. Following are some starting points for carbohydrate intake at each meal. Work with your registered dietitian nutritionist to find the best range that works for your blood glucose and weight.   To Lose Weight To  Maintain Weight  Women 2 - 3 carb servings 3 - 4 carb servings  Men 3 - 4 carb servings 4 - 5 carb servings  Checking your blood glucose after meals will help you know if you need to adjust the timing, type, or number of carbohydrate servings in your meal plan. Achieve and keep a healthy body weight by balancing your food intake and physical activity.  Tips How should I plan my meals?  Plan for half the food on your plate to include non-starchy vegetables, like salad greens, broccoli, or  carrots. Try to eat 3 to 5 servings of non-starchy vegetables every day. Have a protein food at each meal. Protein foods include chicken, fish, meat, eggs, or beans (note that beans contain carbohydrate). These two food groups (non-starchy vegetables and proteins) are low in carbohydrate. If you fill up your plate with these foods, you will eat less carbohydrate but still fill up your stomach. Try to limit your carbohydrate portion to  of the plate.  What fats are healthiest to eat?  Diabetes increases risk for heart disease. To help protect your heart, eat more healthy fats, such as olive oil, nuts, and avocado. Eat less saturated fats like butter, cream, and high-fat meats, like bacon and sausage. Avoid trans fats, which are in all foods that list "partially hydrogenated oil" as an ingredient. What should I drink?  Choose drinks that are not sweetened with sugar. The healthiest choices are water, carbonated or seltzer waters, and tea and coffee without added sugars.  Sweet drinks will make your blood glucose go up very quickly. One serving of soda or energy drink is  cup. It is best to drink these beverages only if your blood glucose is low.  Artificially sweetened, or diet drinks, typically do not increase your blood glucose if they have zero calories in them. Read labels of beverages, as some diet drinks do have carbohydrate and will raise your blood glucose. Label Reading Tips Read Nutrition Facts labels to find out how many grams of carbohydrate are in a food you want to eat. Don't forget: sometimes serving sizes on the label aren't the same as how much food you are going to eat, so you may need to calculate how much carbohydrate is in the food you are serving yourself.   Carbohydrate Counting for People with Diabetes Sample 1-Day Menu  Breakfast  cup yogurt, low fat, low sugar (1 carbohydrate serving)   cup cereal, ready-to-eat, unsweetened (1 carbohydrate serving)  1 cup strawberries (1  carbohydrate serving)   cup almonds ( carbohydrate serving)  Lunch 1, 5 ounce can chunk light tuna  2 ounces cheese, low fat cheddar  6 whole wheat crackers (1 carbohydrate serving)  1 small apple (1 carbohydrate servings)   cup carrots ( carbohydrate serving)   cup snap peas  1 cup 1% milk (1 carbohydrate serving)   Evening Meal Stir fry made with: 3 ounces chicken  1 cup brown rice (3 carbohydrate servings)   cup broccoli ( carbohydrate serving)   cup green beans   cup onions  1 tablespoon olive oil  2 tablespoons teriyaki sauce ( carbohydrate serving)  Evening Snack 1 extra small banana (1 carbohydrate serving)  1 tablespoon peanut butter   Carbohydrate Counting for People with Diabetes Vegan Sample 1-Day Menu  Breakfast 1 cup cooked oatmeal (2 carbohydrate servings)   cup blueberries (1 carbohydrate serving)  2 tablespoons flaxseeds  1 cup soymilk fortified with calcium and vitamin D  1  cup coffee  Lunch 2 slices whole wheat bread (2 carbohydrate servings)   cup baked tofu   cup lettuce  2 slices tomato  2 slices avocado   cup baby carrots ( carbohydrate serving)  1 orange (1 carbohydrate serving)  1 cup soymilk fortified with calcium and vitamin D   Evening Meal Burrito made with: 1 6-inch corn tortilla (1 carbohydrate serving)  1 cup refried vegetarian beans (2 carbohydrate servings)   cup chopped tomatoes   cup lettuce   cup salsa  1/3 cup brown rice (1 carbohydrate serving)  1 tablespoon olive oil for rice   cup zucchini   Evening Snack 6 small whole grain crackers (1 carbohydrate serving)  2 apricots ( carbohydrate serving)   cup unsalted peanuts ( carbohydrate serving)    Carbohydrate Counting for People with Diabetes Vegetarian (Lacto-Ovo) Sample 1-Day Menu  Breakfast 1 cup cooked oatmeal (2 carbohydrate servings)   cup blueberries (1 carbohydrate serving)  2 tablespoons flaxseeds  1 egg  1 cup 1% milk (1 carbohydrate serving)  1  cup coffee  Lunch 2 slices whole wheat bread (2 carbohydrate servings)  2 ounces low-fat cheese   cup lettuce  2 slices tomato  2 slices avocado   cup baby carrots ( carbohydrate serving)  1 orange (1 carbohydrate serving)  1 cup unsweetened tea  Evening Meal Burrito made with: 1 6-inch corn tortilla (1 carbohydrate serving)   cup refried vegetarian beans (1 carbohydrate serving)   cup tomatoes   cup lettuce   cup salsa  1/3 cup brown rice (1 carbohydrate serving)  1 tablespoon olive oil for rice   cup zucchini  1 cup 1% milk (1 carbohydrate serving)  Evening Snack 6 small whole grain crackers (1 carbohydrate serving)  2 apricots ( carbohydrate serving)   cup unsalted peanuts ( carbohydrate serving)    Copyright 2020  Academy of [employer] and Dietetics. All rights reserved.  Using Nutrition Labels: Carbohydrate  . Serving Size  . Look at the serving size. All the information on the label is based on this portion. Jolyne Loa Per Container  . The number of servings contained in the package. . Guidelines for Carbohydrate  . Look at the total grams of carbohydrate in the serving size.  . 1 carbohydrate choice = 15 grams of carbohydrate. Range of Carbohydrate Grams Per Choice  Carbohydrate Grams/Choice Carbohydrate Choices  6-10   11-20 1  21-25 1  26-35 2  36-40 2  41-50 3  51-55 3  56-65 4  66-70 4  71-80 5    Copyright 2020  Academy of Nutrition and Dietetics. All rights reserved.   Ischemic Stroke  An ischemic stroke is the sudden death of brain tissue. This type of stroke happens when part of the brain does not get enough blood. This can cause lifelong change in how the brain works. This change can cause problems in other parts of the body. This condition is an emergency. It must be treated right away. What are the causes? This condition is caused by lower blood flow to part of the brain. This may be due to:  A small clump, or clot, of  blood.  A buildup of fatty substance (plaque) in the blood vessels.  An abnormal heart rhythm.  A blocked or damaged artery in the head or neck. Arteries are blood vessels that move blood away from the heart.  Infection.  Swelling of the arteries in the brain. What increases the risk? Things that  you can change  Other medical problems, such as: ? High blood pressure (hypertension). ? Heart disease. ? Diabetes. ? High cholesterol. ? Being very overweight (obese). ? Paused or stopped breathing during sleep (sleep apnea). ? Migraine headache.  Smoking or other tobacco use.  Not being active.  Heavy alcohol use.  Using drugs.  Taking birth control pills. Things that you cannot change  Being older than age 9.  Having had blood clots, stroke, or mini-stroke (transient ischemic attack, TIA) before.  High blood pressure when you are pregnant, in women.  Stroke in your family.  Sickle cell disease.  Disorders that affect how blood clots. What are the signs or symptoms? Symptoms of a stroke normally happen all of a sudden. They can include:  Weakness or loss of feeling in your face, arm, or leg, often on one side of the body.  Loss of balance.  Loss of controlled, correct movement of your body parts (coordination).  Slurred speech.  Trouble talking or trouble understanding what people say.  Problems with not seeing things correctly.  Feeling dizzy or confused.  Feeling like you may vomit (nauseous) and vomiting.  A very bad headache for no reason. If you can, write down the exact time that you last felt normal and what time you first had symptoms. Tell your doctor. If symptoms come and go, they could be caused by a mini-stroke. Get help right away, even if you feel better. How is this treated? You must get treatment as soon as you have stroke symptoms. Some treatments work better if they are done within 3-6 hours of your first symptoms. You may get medicines  that:  Take out or break up the blood clot.  Control blood pressure.  Thin your blood. Other treatments may include:  Treatment for breathing.  Fluids through an IV tube.  Procedures that make blood flow better. You may need to manage your risk of stroke with medicines and diet changes. After a stroke, you may get therapy to help you get better. Follow these instructions at home: Medicines  Take over-the-counter and prescription medicines only as told by your doctor.  If you were told to take aspirin or another medicine to thin your blood, take it exactly as told. Take it at the same time each day. ? Taking too much of the medicine can cause bleeding. ? If you do not take enough medicine, it may not work as well.  Know the side effects of your medicines. If you are taking a blood thinner, make sure you: ? Hold pressure over any cuts for longer than normal. ? Tell your dentist and other doctors that you take this medicine. ? Avoid activities that could hurt or bruise you. Eating and drinking  Follow instructions from your doctor about diet.  Eat healthy foods.  If you have trouble swallowing: ? Take small bites when eating. ? Eat foods that are soft or pureed. Safety  Follow instructions from your care team about physical activity.  Use a walker or cane as told by your doctor.  Keep your home safe so you do not fall. You may need to: ? Have experts look at your home to make sure it is safe. ? Put grab bars in the bedroom and bathroom. ? Use raised toilets. ? Put a seat in the shower. General instructions  Do not use any products that contain nicotine or tobacco, such as cigarettes, e-cigarettes, and chewing tobacco. If you need help quitting, ask your doctor.  If you drink alcohol: ? Limit how much you use to:  0-1 drink a day for women.  0-2 drinks a day for men. ? Be aware of how much alcohol is in your drink. In the [place] one drink equals one 12 oz bottle  of beer (355 mL), one 5 oz glass of wine (148 mL), or one 1 oz glass of hard liquor (44 mL).  If you need help to stop using drugs or alcohol, ask your doctor to refer you to a program or specialist.  Stay active. Exercise as told.  Wear a medical bracelet as told by your doctor.  Keep all follow-up visits as told by your doctor. Go to visits with all specialists on your care team. This is important. How is this prevented? You can lower your risk of another stroke if you:  Manage high blood pressure, high cholesterol, diabetes, heart disease, sleep problems, and weight.  Quit smoking, limit alcohol, and stay active. Work with your doctor to care for yourself after a stroke. This may keep you from getting more problems. Get help right away if: You have any signs of a stroke. "BE FAST" is an easy way to remember the main warning signs:  B - Balance. Signs are dizziness, sudden trouble walking, or loss of balance.  E - Eyes. Signs are trouble seeing or a change in how you see.  F - Face. Signs are sudden weakness or loss of feeling of the face, or the face or eyelid drooping on one side.  A - Arms. Signs are weakness or loss of feeling in an arm. This happens suddenly and usually on one side of the body.  S - Speech. Signs are sudden trouble speaking, slurred speech, or trouble understanding what people say.  T - Time. Time to call emergency services. Write down what time symptoms started. You have other signs of a stroke, such as:  A sudden, very bad headache with no known cause.  Feeling like you may vomit.  Vomiting.  A seizure. These symptoms may be an emergency. Do not wait to see if the symptoms will go away. Get medical help right away. Call your local emergency services (911 in the [place] Do not drive yourself to the hospital.   Summary  An ischemic stroke is the sudden death of brain tissue.  Symptoms of a stroke often happen all of a sudden.  You must get  treatment as soon as you have stroke symptoms.  Stroke is an emergency. It must be treated right away. This information is not intended to replace advice given to you by your health care provider. Make sure you discuss any questions you have with your health care provider. Document Revised: 01/04/2019 Document Reviewed: 01/04/2019 Elsevier Patient Education  2021 [employer].

## 2020-06-02 NOTE — Progress Notes (Signed)
PROGRESS NOTE    Kristina Montgomery  LYY:[id number] DOB: 05-Nov-1955 DOA: 06/01/2020 PCP: Galvin Proffer, MD    Brief Narrative:  Kristina Montgomery is a 65 year old female with past medical history significant for essential hypertension, hyperlipidemia, CAD, COPD, fibromyalgia, chronic back pain, anxiety/depression who initially presented to [facility] for outpatient cardiac catheterization prior to TAVR.  During the initial evaluation, patient was noted to be significant lethargic and drowsy.  She admitted to taking 30 mg of oxycodone the morning prior to her procedure due to issues with her chronic back pain.  Over the last week, patient admits she has been significantly tired with speech a little more slurred with stumbling intermittently.  She also reports has had an issue with her left leg where she is not able to raise it fully, but reports started about 1 year ago.  Patient denies any headache, no visual changes, no nausea/vomiting, no chest pain, palpitations, no abdominal pain, no diarrhea, no dysuria, no change in sensation.  She admits to smoking 1/2 pack cigarettes per day on average.  Due to symptoms persisting, stat CT scan of the brain without contrast was obtained and notable for an acute CVA of the mid left occipital lobe.  Code stroke was initiated and patient was evaluated by neurology.  Patient was given Narcan 0.6 mg twice with some improvement of her mentation.  She was not a tPA/thrombectomy candidate as her mentation was thought more likely secondary to her pain medications and less likely due to the area of the stroke.  Labs notable for CBC within normal limits, sodium 130, potassium 5.1, chloride 93, CO2 30, BUN 27, creatinine 0.99.  Glucose 125.  Cardiology deferred any further work-up at this time and requested [facility] hospitalist group to admit for acute CVA and confusion likely secondary to narcotic effect.   Assessment & Plan:   Principal Problem:   CVA (cerebral vascular  accident) [facility]) Active Problems:   Aortic stenosis   History of COPD   Type 2 diabetes mellitus with hyperlipidemia (HCC)   Tobacco abuse   Acute respiratory failure with hypoxia (HCC)   Acute metabolic encephalopathy   Acute metabolic encephalopathy, POA Patient was noted be confused, with associated malaise and fatigue while she was presenting for outpatient cardiac catheterization.  She was noted to be significantly lethargic.  Images taking oxycodone 30 mg the morning prior to procedure.  Patient was given Narcan with improvement of symptoms.  UDS positive for THC.  ABG without CO2 retention.  Etiology likely multifactorial with acute/subacute CVA and narcotic effect.  Minimize sedatives as able.  Acute/subacute ischemic CVA Over the last week, patient reports slurred speech, stumbling, fatigue and weakness.  CT head without contrast with acute left mid occipital infarct near the parieto-occipital junction.  MR brain with 3.0 x 2.6 x 3.3 cm cortical/subcortical left parietal lobe infarction, subacute with petechial hemorrhage without mass-effect, 2 small acute/subacute infarcts 12 mm left frontal lobe parietal subcortical white matter.  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 TTE with LVEF 60-65%, grade 1 diastolic dysfunction, LA moderately dilated, IVC within normal limits, mild MR.  Carotid ultrasound with no significant flow-limiting disease.  EEG with no seizures or epileptiform discharges noted.  LDL 114, HDL 46.  Seen  by PT/OT with no recommendations. --Neurology following, appreciate assistance --Aspirin and Plavix x21 days, followed by aspirin 81 mg daily alone --Continue monitor on telemetry --Await further  recommendations per neurology  Acute respiratory failure with hypoxia COPD Patient was noted to have O2 saturations as low as 86% on room air, subsequently placed on 2 L nasal cannula.  Chest x-ray with cardiomegaly, mild bibasilar atelectasis, otherwise no acute finding.  Not on oxygen at baseline.  Suspect etiology likely secondary to respiratory depression/narcotic effect.  Patient was titrated off submental oxygen this morning.  Evaluation by physical therapy shows no significant desaturations below 88% with ambulation. --Continue intermittent pulse ox checks  Essential hypertension On amlodipine-olmesartan 10-40 mg p.o. daily at home. --Holding home antihypertensives to allow for permissive hypertension for 24 hours --Hydralazine IV for SBP greater than 220/110  Type 2 diabetes mellitus Hemoglobin A1c 11.1, poorly controlled.  On pioglitazone 45 mg p.o. daily, semaglutide at home. --SSI for coveage --CBGs qAC/HS  Chronic back pain On oxycodone 30 mg extended release every 12 hours at home. --Holding home narcotics given confusion/somnolence that improved with Narcan on initial presentation  Hyperlipidemia Total cholesterol 207, HDL 46, LDL 114, triglycerides 235. --Atorvastatin 80 mg p.o. daily  Hypothyroidism: Levothyroxine 25 mcg p.o. daily  Depression: -- Doxepin 300 mg p.o. nightly -- Venlafaxine 50 mg p.o. twice daily  Tobacco use disorder Patient continues to endorse half pack cigarettes per day on average.  Counseled on need for complete cessation given acute CVA. --Nicotine patch  Aortic stenosis Currently being worked up outpatient by cardiology for consideration of TAVR.   DVT prophylaxis: enoxaparin (LOVENOX) injection 40 mg Start: 06/01/20 2200    Code Status: Full Code Family Communication: none present at bedside this am  Disposition Plan:  Level of care: Telemetry Cardiac Status is: Inpatient  Remains inpatient appropriate because:Ongoing diagnostic  testing needed not appropriate for outpatient work up, Unsafe d/c plan, IV treatments appropriate due to intensity of illness or inability to take PO and Inpatient level of care appropriate due to severity of illness   Dispo: The patient is from: Home              Anticipated d/c is to: Home              Patient currently is not medically stable to d/c.   Difficult to place patient No   Consultants:   Neurology  Procedures:   TTE  EEG  Antimicrobials:   None   Subjective: Patient seen examined bedside, resting comfortably.  Confusion now seems resolved.  Awaiting MR/MRA brain/head this morning.  EEG also pending.  Continues with weakness/decreased mobility of left lower extremity, but reports this is chronic over the past year.  No other focal neurological findings/complaints.  Patient denies headache, no dizziness, no visual changes, no chest pain, palpitations, no shortness of breath, no abdominal pain, no fever/chills/night sweats, no nausea/vomiting/diarrhea, no abdominal pain, no current fatigue, no paresthesias.  No acute events overnight per nursing staff.  Objective: Vitals:   06/02/20 0235 06/02/20 0432 06/02/20 0757 06/02/20 1315  BP: (!) 166/77 (!) 188/75 (!) 152/76 (!) 153/66  Pulse: 100 98 (!) 109   Resp: Temp: 98.3 F (36.8 C) 98.2 F (36.8 C) 98 F (36.7 C) 97.8 F (36.6 C)  TempSrc: Oral Oral Oral Oral  SpO2: 91% 96% 94%   Weight:      Height:        Intake/Output Summary (Last 24  hours) at 06/02/2020 1406 Last data filed at 06/02/2020 0429 Gross per 24 hour  Intake 3 ml  Output 2450 ml  Net -2447 ml   Filed Weights   06/01/20 0703 06/01/20 1330  Weight: 81.6 kg 83.6 kg    Examination:  General exam: Appears calm and comfortable, chronically ill appearance, appears older than stated age Respiratory system: Clear to auscultation. Respiratory effort normal.  On room air Cardiovascular system: S1 & S2 heard, 3/6 SEM RUSB. No JVD,  murmurs, rubs, gallops or clicks. No pedal edema. Gastrointestinal system: Abdomen is nondistended, soft and nontender. No organomegaly or masses felt. Normal bowel sounds heard. Central nervous system: Alert and oriented. No focal neurological deficits. Extremities: Symmetric 5 x 5 power. Skin: No rashes, lesions or ulcers Psychiatry: Judgement and insight appear poor. Mood & affect appropriate.     Data Reviewed: I have personally reviewed following labs and imaging studies  CBC: Recent Labs  Lab 06/01/20 1143  WBC 9.5  HGB 14.4  HCT 43.4  MCV 92.5  PLT 265   Basic Metabolic Panel: Recent Labs  Lab 06/01/20 0701 06/02/20 0756  NA 130* 132*  K 5.1 4.1  CL 93* 96*  CO2 30 27  GLUCOSE 125* 204*  BUN 27* 14  CREATININE 0.99 0.74  CALCIUM 9.3 8.7*   GFR: Estimated Creatinine Clearance: 72.8 mL/min (by C-G formula based on SCr of 0.74 mg/dL). Liver Function Tests: No results for input(s): AST, ALT, ALKPHOS, BILITOT, PROT, ALBUMIN in the last 168 hours. No results for input(s): LIPASE, AMYLASE in the last 168 hours. Recent Labs  Lab 06/01/20 1836  AMMONIA 20   Coagulation Profile: Recent Labs  Lab 06/01/20 1404  INR 0.9   Cardiac Enzymes: No results for input(s): CKTOTAL, CKMB, CKMBINDEX, TROPONINI in the last 168 hours. BNP (last 3 results) No results for input(s): PROBNP in the last 8760 hours. HbA1C: Recent Labs    06/01/20 1214  HGBA1C 11.1*   CBG: Recent Labs  Lab 06/01/20 1743 06/01/20 2108 06/02/20 0420 06/02/20 0757 06/02/20 1312  GLUCAP 130* 115* 160* 233* 196*   Lipid Profile: Recent Labs    06/01/20 1404  CHOL 207*  HDL 46  LDLCALC 114*  TRIG 235*  CHOLHDL 4.5   Thyroid Function Tests: No results for input(s): TSH, T4TOTAL, FREET4, T3FREE, THYROIDAB in the last 72 hours. Anemia Panel: No results for input(s): VITAMINB12, FOLATE, FERRITIN, TIBC, IRON, RETICCTPCT in the last 72 hours. Sepsis Labs: No results for input(s):  PROCALCITON, LATICACIDVEN in the last 168 hours.  Recent Results (from the past 240 hour(s))  SARS CORONAVIRUS 2 (TAT 6-24 HRS) Nasopharyngeal Nasopharyngeal Swab     Status: None   Collection Time: 05/30/20 12:37 PM   Specimen: Nasopharyngeal Swab  Result Value Ref Range Status   SARS Coronavirus 2 NEGATIVE NEGATIVE Final    Comment: (NOTE) SARS-CoV-2 target nucleic acids are NOT DETECTED.  The SARS-CoV-2 RNA is generally detectable in upper and lower respiratory specimens during the acute phase of infection. Negative results do not preclude SARS-CoV-2 infection, do not rule out co-infections with other pathogens, and should not be used as the sole basis for treatment or other patient management decisions. Negative results must be combined with clinical observations, patient history, and epidemiological information. The expected result is Negative.  Fact Sheet for Patients: HairSlick.no  Fact Sheet for Healthcare Providers: quierodirigir.com  This test is not yet approved or cleared by the [place] FDA and  has been authorized for detection and/or  diagnosis of SARS-CoV-2 by FDA under an Emergency Use Authorization (EUA). This EUA will remain  in effect (meaning this test can be used) for the duration of the COVID-19 declaration under Se ction 564(b)(1) of the Act, 21 U.S.C. section 360bbb-3(b)(1), unless the authorization is terminated or revoked sooner.  Performed at [facility], [address] [place], [place] [address]          Radiology Studies: CT HEAD WO CONTRAST  Result Date: 06/01/2020 CLINICAL DATA:  Altered mental status EXAM: CT HEAD WITHOUT CONTRAST TECHNIQUE: Contiguous axial images were obtained from the base of the skull through the vertex without intravenous contrast. COMPARISON:  April 28, 2014. FINDINGS: Brain: Ventricles and sulci are normal in size and configuration. No well-defined  mass. No hemorrhage, extra-axial fluid collection, midline shift. There is decreased attenuation in the superior midportion left occipital lobe near the left parieto-occipital junction consistent with acute infarct. Elsewhere brain parenchyma appears unremarkable. Vascular: No hyperdense vessel. Foci of calcification noted in the carotid siphon regions. Skull: Bony calvarium appears intact. Sinuses/Orbits: Paranasal sinuses are clear. There is leftward deviation of the nasal septum. Orbits appear symmetric bilaterally. Other: Mastoid air cells are clear. IMPRESSION: Decreased attenuation in the superior mid left occipital lobe near the parieto-occipital junction, likely due to acute infarct. No similar changes elsewhere. No evident mass on noncontrast study. No hemorrhage. Foci of arterial vascular calcification noted. Deviated nasal septum. These results will be called to the ordering clinician or representative by the Radiologist Assistant, and communication documented in the PACS or [employer]. Electronically Signed   By: Bretta Bang III M.D.   On: 06/01/2020 11:12   MR ANGIO HEAD WO CONTRAST  Result Date: 06/02/2020 CLINICAL DATA:  Stroke, follow-up. EXAM: MRI HEAD WITHOUT CONTRAST MRA HEAD WITHOUT CONTRAST TECHNIQUE: Multiplanar, multi-echo pulse sequences of the brain and surrounding structures were acquired without intravenous contrast. Angiographic images of the Circle of Willis were acquired using MRA technique without intravenous contrast. COMPARISON: No pertinent prior exam. COMPARISON:  Prior CT head 06/01/2020.  Prior CT head 11/06/2004. FINDINGS: MRI HEAD FINDINGS Brain: Mild intermittent motion degradation. Cerebral volume is normal for age. Cortical/subcortical infarct within the left parietal lobe measuring 3.0 x 2.6 x 3.3 cm. Diffusion-weighted hyperintensity is present along the periphery of the infarct. However, there is predominantly corresponding intermediate signal on the ADC  map. Additionally, there is developing encephalomalacia at this site and this infarct is likely subacute. Petechial hemorrhage is also present at this site. No significant mass effect. There are two acute/early subacute infarcts within the posterior left frontoparietal subcortical white matter. Background moderate multifocal T2/FLAIR hyperintensity within the cerebral white matter, nonspecific but compatible with chronic small vessel ischemic disease. No evidence of intracranial mass. No extra-axial fluid collection. No midline shift. Vascular: Reported below. Skull and upper cervical spine: No focal marrow lesion. Incompletely assessed cervical spondylosis. Sinuses/Orbits: Visualized orbits show no acute finding. No significant paranasal sinus disease. Other: Trace fluid within the left mastoid air cells. MRA HEAD FINDINGS Anterior circulation: The intracranial internal carotid arteries are patent. Mild atherosclerotic irregularity of both vessels without significant stenosis. The M1 middle cerebral arteries are patent. No M2 proximal branch occlusion is identified. Severe stenosis within a proximal to mid left M2 MCA branch (series 353, image 6). The anterior cerebral arteries are patent. Moderate stenosis at the origin of the A1 right anterior cerebral artery (series 353, image 15). 2-3 mm anteromedially projecting vascular protrusion arising from the proximal right cavernous ICA  compatible with aneurysm (series 453, image 232). 1-2 mm inferiorly projecting vascular protrusion arising from the supraclinoid right ICA, which may reflect an aneurysm or infundibulum (series 453, image 238). 2 mm vascular protrusion projecting anteriorly from the cavernous left ICA, suspicious for aneurysm (series 3, image 68) (series 453, image 199). 1 mm vascular protrusion projecting medially from the cavernous left ICA, which could reflect an additional aneurysm (series 3, image 66). Posterior circulation: The intracranial  vertebral arteries are patent. The basilar artery is patent. The posterior cerebral arteries are patent. Hypoplastic left P1 segment with sizable left posterior communicating artery. Anatomic variants: As described IMPRESSION: MRI brain: 1. 3.0 x 2.6 x 3.3 cm cortical/subcortical left parietal lobe infarct, as described and likely subacute. Petechial hemorrhage is present at this site. No significant mass effect. 2. Two acute/early subacute infarcts measuring up to 12 mm within the left frontoparietal subcortical white matter. 3. Background moderate cerebral white matter chronic small vessel ischemic disease. MRA head: 1. No intracranial large vessel occlusion. 2. Severe stenosis within a proximal to mid left M2 MCA vessel. 3. Mild atherosclerotic irregularity of the intracranial internal carotid arteries bilaterally. 4. 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 5. 1-2 mm inferiorly projecting vascular protrusion arising from the supraclinoid right ICA, which may reflect an aneurysm or infundibulum. 6. 2 mm vascular protrusion projecting anteriorly from the cavernous left ICA, suspicious for aneurysm. 7. 1 mm vascular protrusion projecting medially from the cavernous left ICA, which could reflect an additional small aneurysm. Electronically Signed   By: Jackey Loge DO   On: 06/02/2020 13:44   MR BRAIN WO CONTRAST  Result Date: 06/02/2020 CLINICAL DATA:  Stroke, follow-up. EXAM: MRI HEAD WITHOUT CONTRAST MRA HEAD WITHOUT CONTRAST TECHNIQUE: Multiplanar, multi-echo pulse sequences of the brain and surrounding structures were acquired without intravenous contrast. Angiographic images of the Circle of Willis were acquired using MRA technique without intravenous contrast. COMPARISON: No pertinent prior exam. COMPARISON:  Prior CT head 06/01/2020.  Prior CT head 11/06/2004. FINDINGS: MRI HEAD FINDINGS Brain: Mild intermittent motion  degradation. Cerebral volume is normal for age. Cortical/subcortical infarct within the left parietal lobe measuring 3.0 x 2.6 x 3.3 cm. Diffusion-weighted hyperintensity is present along the periphery of the infarct. However, there is predominantly corresponding intermediate signal on the ADC map. Additionally, there is developing encephalomalacia at this site and this infarct is likely subacute. Petechial hemorrhage is also present at this site. No significant mass effect. There are two acute/early subacute infarcts within the posterior left frontoparietal subcortical white matter. Background moderate multifocal T2/FLAIR hyperintensity within the cerebral white matter, nonspecific but compatible with chronic small vessel ischemic disease. No evidence of intracranial mass. No extra-axial fluid collection. No midline shift. Vascular: Reported below. Skull and upper cervical spine: No focal marrow lesion. Incompletely assessed cervical spondylosis. Sinuses/Orbits: Visualized orbits show no acute finding. No significant paranasal sinus disease. Other: Trace fluid within the left mastoid air cells. MRA HEAD FINDINGS Anterior circulation: The intracranial internal carotid arteries are patent. Mild atherosclerotic irregularity of both vessels without significant stenosis. The M1 middle cerebral arteries are patent. No M2 proximal branch occlusion is identified. Severe stenosis within a proximal to mid left M2 MCA branch (series 353, image 6). The anterior cerebral arteries are patent. Moderate stenosis at the origin of the A1 right anterior cerebral artery (series 353, image 15). 2-3 mm anteromedially projecting vascular protrusion arising from the proximal right cavernous ICA compatible with aneurysm (  series 453, image 232). 1-2 mm inferiorly projecting vascular protrusion arising from the supraclinoid right ICA, which may reflect an aneurysm or infundibulum (series 453, image 238). 2 mm vascular protrusion projecting  anteriorly from the cavernous left ICA, suspicious for aneurysm (series 3, image 68) (series 453, image 199). 1 mm vascular protrusion projecting medially from the cavernous left ICA, which could reflect an additional aneurysm (series 3, image 66). Posterior circulation: The intracranial vertebral arteries are patent. The basilar artery is patent. The posterior cerebral arteries are patent. Hypoplastic left P1 segment with sizable left posterior communicating artery. Anatomic variants: As described IMPRESSION: MRI brain: 1. 3.0 x 2.6 x 3.3 cm cortical/subcortical left parietal lobe infarct, as described and likely subacute. Petechial hemorrhage is present at this site. No significant mass effect. 2. Two acute/early subacute infarcts measuring up to 12 mm within the left frontoparietal subcortical white matter. 3. Background moderate cerebral white matter chronic small vessel ischemic disease. MRA head: 1. No intracranial large vessel occlusion. 2. Severe stenosis within a proximal to mid left M2 MCA vessel. 3. Mild atherosclerotic irregularity of the intracranial internal carotid arteries bilaterally. 4. 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 5. 1-2 mm inferiorly projecting vascular protrusion arising from the supraclinoid right ICA, which may reflect an aneurysm or infundibulum. 6. 2 mm vascular protrusion projecting anteriorly from the cavernous left ICA, suspicious for aneurysm. 7. 1 mm vascular protrusion projecting medially from the cavernous left ICA, which could reflect an additional small aneurysm. Electronically Signed   By: Jackey Loge DO   On: 06/02/2020 13:44   DG CHEST PORT 1 VIEW  Result Date: 06/01/2020 CLINICAL DATA:  Lethargic EXAM: PORTABLE CHEST 1 VIEW COMPARISON:  Chest radiograph dated 03/21/2016 and CT chest dated 11/05/2018. FINDINGS: The heart is enlarged. There is mild bibasilar atelectasis/airspace  disease. There is no pleural effusion or pneumothorax. Degenerative changes are seen in the spine. IMPRESSION: Mild bibasilar atelectasis/airspace disease.  Cardiomegaly. Electronically Signed   By: Romona Curls M.D.   On: 06/01/2020 15:41   EEG adult  Result Date: 06/02/2020 Charlsie Quest, MD     06/02/2020 12:56 PM Patient Name: Kristina Montgomery MRN: [id number] Epilepsy Attending: Charlsie Quest Referring Physician/Provider: Clemon Chambers, PA Date: 06/02/2020 Duration: 23.33 mins Patient history: 65 year old female with acute left parieto-occipital stroke as well as altered mental status.  EEG done for seizures. Level of alertness: Awake AEDs during EEG study: None Technical aspects: This EEG study was done with scalp electrodes positioned according to the 10-20 International system of electrode placement. Electrical activity was acquired at a sampling rate of  and reviewed with a high frequency filter of  and a low frequency filter of . EEG data were recorded continuously and digitally stored. Description: The posterior dominant rhythm consists of 8 Hz activity of moderate voltage (25-35 uV) seen predominantly in posterior head regions, symmetric and reactive to eye opening and eye closing.  Physiologic photic driving was not seen during photic stimulation.  Hyperventilation was not performed.   IMPRESSION: This study is within normal limits. No seizures or epileptiform discharges were seen throughout the recording. Charlsie Quest   ECHOCARDIOGRAM COMPLETE  Result Date: 06/01/2020    ECHOCARDIOGRAM REPORT   Patient Name:   Kristina Montgomery Date of Exam: 06/01/2020 Medical Rec #:  [id number]       Height:       65.0 in Accession #:    [id number]  Weight:       184.3 lb Date of Birth:  04-01-1955       BSA:          1.911 m Patient Age:    65 years        BP:           114/68 mmHg Patient Gender: F               HR:           85 bpm. Exam Location:  Inpatient Procedure: 2D Echo, Cardiac  Doppler and Color Doppler Indications:    CVA  History:        Patient has prior history of Echocardiogram examinations, most                 recent 05/03/2020. CAD, COPD, Signs/Symptoms:Murmur; Risk                 Factors:Dyslipidemia, Diabetes and Hypertension.  Sonographer:    Neomia Dear RDCS Referring Phys: [id number] RONDELL A SMITH  Sonographer Comments: Technically challenging study due to limited acoustic windows and patient is morbidly obese. IMPRESSIONS  1. Left ventricular ejection fraction, by estimation, is 60 to 65%. The left ventricle has normal function. The left ventricle has no regional wall motion abnormalities. There is moderate left ventricular hypertrophy. Left ventricular diastolic parameters are consistent with Grade I diastolic dysfunction (impaired relaxation).  2. Right ventricular systolic function is normal. The right ventricular size is normal. There is normal pulmonary artery systolic pressure.  3. Left atrial size was moderately dilated.  4. The mitral valve is grossly normal. Mild mitral valve regurgitation.  5. The aortic valve was not well visualized. Aortic valve regurgitation is trivial.  6. The inferior vena cava is normal in size with greater than 50% respiratory variability, suggesting right atrial pressure of 3 mmHg. FINDINGS  Left Ventricle: Left ventricular ejection fraction, by estimation, is 60 to 65%. The left ventricle has normal function. The left ventricle has no regional wall motion abnormalities. The left ventricular internal cavity size was normal in size. There is  moderate left ventricular hypertrophy. Left ventricular diastolic parameters are consistent with Grade I diastolic dysfunction (impaired relaxation). Right Ventricle: The right ventricular size is normal. No increase in right ventricular wall thickness. Right ventricular systolic function is normal. There is normal pulmonary artery systolic pressure. The tricuspid regurgitant velocity is 2.62 m/s, and   with an assumed right atrial pressure of 3 mmHg, the estimated right ventricular systolic pressure is 30.5 mmHg. Left Atrium: Left atrial size was moderately dilated. Right Atrium: Right atrial size was normal in size. Pericardium: There is no evidence of pericardial effusion. Mitral Valve: The mitral valve is grossly normal. Mild mitral valve regurgitation. MV peak gradient, 12.4 mmHg. The mean mitral valve gradient is 5.0 mmHg. Tricuspid Valve: The tricuspid valve is not well visualized. Tricuspid valve regurgitation is not demonstrated. Aortic Valve: The aortic valve was not well visualized. Aortic valve regurgitation is trivial. Aortic regurgitation PHT measures 279 msec. Aortic valve mean gradient measures 29.3 mmHg. Aortic valve peak gradient measures 53.8 mmHg. Aortic valve area, by  VTI measures 1.29 cm. Pulmonic Valve: The pulmonic valve was not well visualized. Pulmonic valve regurgitation is not visualized. Aorta: The aortic root and ascending aorta are structurally normal, with no evidence of dilitation. Venous: The inferior vena cava is normal in size with greater than 50% respiratory variability, suggesting right atrial pressure of 3 mmHg. IAS/Shunts: The atrial septum  is grossly normal.  LEFT VENTRICLE PLAX 2D LVIDd:         4.90 cm     Diastology LVIDs:         3.30 cm     LV e' medial:    4.38 cm/s LV PW:         1.70 cm     LV E/e' medial:  23.1 LV IVS:        1.40 cm     LV e' lateral:   4.38 cm/s LVOT diam:     2.40 cm     LV E/e' lateral: 23.1 LV SV:         95 LV SV Index:   50 LVOT Area:     4.52 cm  LV Volumes (MOD) LV vol d, MOD A2C: 46.0 ml LV vol d, MOD A4C: 94.4 ml LV vol s, MOD A2C: 30.9 ml LV vol s, MOD A4C: 36.8 ml LV SV MOD A2C:     15.1 ml LV SV MOD A4C:     94.4 ml LV SV MOD BP:      31.6 ml RIGHT VENTRICLE RV S prime:     14.70 cm/s TAPSE (M-mode): 1.6 cm LEFT ATRIUM             Index       RIGHT ATRIUM           Index LA diam:        4.10 cm 2.15 cm/m  RA Area:     12.20 cm LA  Vol (A2C):   91.8 ml 48.05 ml/m RA Volume:   24.70 ml  12.93 ml/m LA Vol (A4C):   60.7 ml 31.77 ml/m LA Biplane Vol: 76.8 ml 40.20 ml/m  AORTIC VALVE                    PULMONIC VALVE AV Area (Vmax):    1.41 cm     PV Vmax:       2.10 m/s AV Area (Vmean):   1.27 cm     PV Vmean:      220.000 cm/s AV Area (VTI):     1.29 cm     PV VTI:        0.394 m AV Vmax:           366.67 cm/s  PV Peak grad:  17.6 mmHg AV Vmean:          246.000 cm/s PV Mean grad:  21.0 mmHg AV VTI:            0.739 m AV Peak Grad:      53.8 mmHg AV Mean Grad:      29.3 mmHg LVOT Vmax:         114.00 cm/s LVOT Vmean:        69.300 cm/s LVOT VTI:          0.211 m LVOT/AV VTI ratio: 0.29 AI PHT:            279 msec  AORTA Ao Root diam: 2.80 cm Ao Asc diam:  3.10 cm MITRAL VALVE                TRICUSPID VALVE MV Area (PHT): 4.21 cm     TR Peak grad:   27.5 mmHg MV Area VTI:   2.90 cm     TR Vmax:        262.00 cm/s MV Peak grad:  12.4 mmHg MV Mean grad:  5.0 mmHg     SHUNTS MV Vmax:       1.76 m/s     Systemic VTI:  0.21 m MV Vmean:      99.1 cm/s    Systemic Diam: 2.40 cm MV Decel Time: 180 msec MR Peak grad: 93.3 mmHg MR Mean grad: 62.0 mmHg MR Vmax:      483.00 cm/s MR Vmean:     376.0 cm/s MV E velocity: 101.00 cm/s MV A velocity: 162.00 cm/s MV E/A ratio:  0.62 Kristeen Miss MD Electronically signed by Kristeen Miss MD Signature Date/Time: 06/01/2020/4:42:14 PM    Final    VAS US CAROTID (at [facility] and [facility] only)  Result Date: 06/02/2020 Carotid Arterial Duplex Study Patient Name:  Kristina Montgomery  Date of Exam:   06/01/2020 Medical Rec #: [id number]        Accession #:    [id number] Date of Birth: Apr 11, 1955        Patient Gender: F Patient Age:   65Y Exam Location:  [facility] Procedure:      VAS US CAROTID Referring Phys: [id number] RONDELL A SMITH --------------------------------------------------------------------------------  Indications:       CVA. Risk Factors:      Hypertension, hyperlipidemia, Diabetes, current smoker,                     coronary artery disease. Comparison Study:  No prior studies. Performing Technologist: Jean Rosenthal RDMS,RVT  Examination Guidelines: A complete evaluation includes B-mode imaging, spectral Doppler, color Doppler, and power Doppler as needed of all accessible portions of each vessel. Bilateral testing is considered an integral part of a complete examination. Limited examinations for reoccurring indications may be performed as noted.  Right Carotid Findings: +----------+--------+--------+--------+-------------------------+--------+           PSV cm/sEDV cm/sStenosisPlaque Description       Comments +----------+--------+--------+--------+-------------------------+--------+ CCA Prox  90      22                                                +----------+--------+--------+--------+-------------------------+--------+ CCA Distal71      20              calcific                          +----------+--------+--------+--------+-------------------------+--------+ ICA Prox  71      26      1-39%   focal and calcific                +----------+--------+--------+--------+-------------------------+--------+ ICA Distal109     44                                                +----------+--------+--------+--------+-------------------------+--------+ ECA       288     25      Occludedcalcific and heterogenous         +----------+--------+--------+--------+-------------------------+--------+ +----------+--------+-------+----------------+-------------------+           PSV cm/sEDV cmsDescribe        Arm Pressure (mmHG) +----------+--------+-------+----------------+-------------------+ Subclavian175            Multiphasic, WNL                    +----------+--------+-------+----------------+-------------------+ +---------+--------+--+--------+--+---------+  VertebralPSV cm/s64EDV cm/s22Antegrade +---------+--------+--+--------+--+---------+  Left Carotid Findings:  +----------+--------+--------+--------+------------------+--------+           PSV cm/sEDV cm/sStenosisPlaque DescriptionComments +----------+--------+--------+--------+------------------+--------+ CCA Prox  134     35                                         +----------+--------+--------+--------+------------------+--------+ CCA Distal89      25              heterogenous               +----------+--------+--------+--------+------------------+--------+ ICA Prox  109     47      1-39%   heterogenous               +----------+--------+--------+--------+------------------+--------+ ICA Mid   117     54                                         +----------+--------+--------+--------+------------------+--------+ ICA Distal126     55                                         +----------+--------+--------+--------+------------------+--------+ ECA       133     16              calcific                   +----------+--------+--------+--------+------------------+--------+ +----------+--------+--------+----------------+-------------------+           PSV cm/sEDV cm/sDescribe        Arm Pressure (mmHG) +----------+--------+--------+----------------+-------------------+ [id number]             Multiphasic, WNL                    +----------+--------+--------+----------------+-------------------+ +---------+--------+--+--------+--+---------+ VertebralPSV cm/s64EDV cm/s20Antegrade +---------+--------+--+--------+--+---------+   Summary: Right Carotid: Velocities in the right ICA are consistent with a 1-39% stenosis.                The ECA appears >50% stenosed. Left Carotid: Velocities in the left ICA are consistent with a 1-39% stenosis. Vertebrals:  Bilateral vertebral arteries demonstrate antegrade flow. Subclavians: Normal flow hemodynamics were seen in bilateral subclavian              arteries. *See table(s) above for measurements and observations.   Electronically signed by Delia Heady MD on 06/02/2020 at 11:01:56 AM.    Final         Scheduled Meds: .  stroke: mapping our early stages of recovery book   Does not apply Once  . atorvastatin  80 mg Oral Daily  . clopidogrel  75 mg Oral Daily  . doxepin  300 mg Oral QHS  . enoxaparin (LOVENOX) injection  40 mg Subcutaneous Q24H  . insulin aspart  0-9 Units Subcutaneous TID WC  . levothyroxine  25 mcg Oral QAC breakfast  . nicotine  14 mg Transdermal Daily  . sodium chloride flush  3 mL Intravenous Q12H  . venlafaxine  50 mg Oral BID   Continuous Infusions: . sodium chloride    . sodium chloride       LOS: 1 day    Time spent: 45 minutes spent on chart review, discussion with nursing  staff, consultants, updating family and interview/physical exam; more than 50% of that time was spent in counseling and/or coordination of care.    Alvira PhilipsEric J [place], DO [facility] Available via Epic secure chat 7am-7pm After these hours, please refer to coverage provider listed on [email or web address] 06/02/2020, 2:06 PM

## 2020-06-02 NOTE — Procedures (Signed)
Patient Name: Kristina Montgomery  MRN: [id number]  Epilepsy Attending: Charlsie Quest  Referring Physician/Provider: Clemon Chambers, PA Date: 06/02/2020 Duration: 23.33 mins  Patient history: 65 year old female with acute left parieto-occipital stroke as well as altered mental status.  EEG done for seizures.  Level of alertness: Awake  AEDs during EEG study: None  Technical aspects: This EEG study was done with scalp electrodes positioned according to the 10-20 International system of electrode placement. Electrical activity was acquired at a sampling rate of 500Hz  and reviewed with a high frequency filter of 70Hz  and a low frequency filter of 1Hz . EEG data were recorded continuously and digitally stored.   Description: The posterior dominant rhythm consists of 8 Hz activity of moderate voltage (25-35 uV) seen predominantly in posterior head regions, symmetric and reactive to eye opening and eye closing.  Physiologic photic driving was not seen during photic stimulation.  Hyperventilation was not performed.     IMPRESSION: This study is within normal limits. No seizures or epileptiform discharges were seen throughout the recording.  Verner Kopischke 

## 2020-06-02 NOTE — Evaluation (Signed)
Physical Therapy Evaluation & Discharge Patient Details Name: Kristina Montgomery MRN: [id number] DOB: 08-Jul-1955 Today's Date: 06/02/2020   History of Present Illness  Pt is a 65 y.o. female admitted 06/01/20 to short stay area for cardiac cath for aortic stenosis and potential TAVR. Cath cancelled secondary to lethargy. 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 Per neuro, pt essentially with no new deficit from this stroke. PMH includes asthma, COPD, CAD, DM, HTN, fibromyalgia, asthma, depression.    Clinical Impression  Patient evaluated by Physical Therapy with no further acute PT needs identified. PTA, pt independent, has multiple supportive family members nearby. Today, pt independent with mobility and ADL tasks. VSS on RA. All education has been completed and the patient has no further questions. Acute PT is signing off. Thank you for this referral.    Follow Up Recommendations No PT follow up    Equipment Recommendations  None recommended by PT    Recommendations for Other Services       Precautions / Restrictions Precautions Precautions: None Restrictions Weight Bearing Restrictions: No      Mobility  Bed Mobility Overal bed mobility: Independent                  Transfers Overall transfer level: Independent Equipment used: None             General transfer comment: Sit<>stand from bed, low toilet height and recliner indep  Ambulation/Gait Ambulation/Gait assistance: Independent Gait Distance (Feet): 40 Feet Assistive device: None Gait Pattern/deviations: Step-through pattern;Decreased stride length Gait velocity: Decreased   General Gait Details: Indep ambulation throughout room with and without pushing IV pole; no overt instability or LOB  Stairs            Wheelchair Mobility    Modified Rankin (Stroke Patients Only) Modified Rankin (Stroke Patients  Only) Pre-Morbid Rankin Score: No symptoms Modified Rankin: No symptoms     Balance Overall balance assessment: No apparent balance deficits (not formally assessed)             Standing balance comment: Able to perform multiple ADL tasks at sink without UE support (brushing teeth, washing face, brushing hair)                             Pertinent Vitals/Pain Pain Assessment: No/denies pain    Home Living Family/patient expects to be discharged to:: Private residence Living Arrangements: Spouse/significant other Available Help at Discharge: Family;Friend(s);Available 24 hours/day Type of Home: House Home Access: Level entry     Home Layout: One level Home Equipment: Walker - 2 wheels;Cane - single point Additional Comments: Lives with "someone" (reports family); also with 4 boys who live nearby and "they help me with as much as I need"    Prior Function Level of Independence: Independent         Comments: Drives. Does not work. Enjoys time with grandkids/great grandkids     Hand Dominance        Extremity/Trunk Assessment   Upper Extremity Assessment Upper Extremity Assessment: Overall WFL for tasks assessed    Lower Extremity Assessment Lower Extremity Assessment: LLE deficits/detail LLE Deficits / Details: pt reports chronic LLE issues; knee ext 4/5, knee flex 3/5, hip flex 2/5, ankle 5/5       Communication   Communication: No difficulties  Cognition Arousal/Alertness: Awake/alert Behavior During Therapy: WFL for tasks assessed/performed Overall  Cognitive Status: Within Functional Limits for tasks assessed                                 General Comments: Some difficulty with memory (recalling name of beach she recently visited), pt reports baseline memory      General Comments General comments (skin integrity, edema, etc.): SpO2 88-96% on RA, no DOE noted    Exercises     Assessment/Plan    PT Assessment Patent  does not need any further PT services  PT Problem List         PT Treatment Interventions      PT Goals (Current goals can be found in the Care Plan section)  Acute Rehab PT Goals Patient Stated Goal: Home today PT Goal Formulation: With patient Time For Goal Achievement: 06/16/20 Potential to Achieve Goals: Good    Frequency     Barriers to discharge        Co-evaluation               AM-PAC PT "6 Clicks" Mobility  Outcome Measure Help needed turning from your back to your side while in a flat bed without using bedrails?: None Help needed moving from lying on your back to sitting on the side of a flat bed without using bedrails?: None Help needed moving to and from a bed to a chair (including a wheelchair)?: None Help needed standing up from a chair using your arms (e.g., wheelchair or bedside chair)?: None Help needed to walk in hospital room?: None Help needed climbing 3-5 steps with a railing? : None 6 Click Score: 24    End of Session Equipment Utilized During Treatment: Gait belt Activity Tolerance: Patient tolerated treatment well Patient left: in chair;with call bell/phone within reach Nurse Communication: Mobility status PT Visit Diagnosis: Other abnormalities of gait and mobility (R26.89)    Time: 2202-5427 PT Time Calculation (min) (ACUTE ONLY): 30 min   Charges:   PT Evaluation $PT Eval Low Complexity: 1 Low PT Treatments $Therapeutic Activity: 8-22 mins   Kristina Montgomery, PT, DPT Acute Rehabilitation Services  Pager [phone] Office [phone]  Kristina Montgomery 06/02/2020, 10:25 AM

## 2020-06-02 NOTE — Care Management Obs Status (Signed)
MEDICARE OBSERVATION STATUS NOTIFICATION   Patient Details  Name: Kristina Montgomery MRN: [id number] Date of Birth: 03-13-1955   Medicare Observation Status Notification Given:  Yes    Darrold Span, RN 06/02/2020, 3:24 PM

## 2020-06-02 NOTE — Progress Notes (Signed)
EEG complete - results pending 

## 2020-06-02 NOTE — Care Management CC44 (Signed)
Condition Code 44 Documentation Completed  Patient Details  Name: Kristina Montgomery MRN: [id number] Date of Birth: [birth year]   Condition Code 44 given:  Yes Patient signature on Condition Code 44 notice:  Yes Documentation of 2 MD's agreement:  Yes Code 44 added to claim:  Yes    Darrold Span, RN 06/02/2020, 3:24 PM

## 2020-06-05 NOTE — Telephone Encounter (Signed)
Unable to leave message mailbox full.Will try later ./cy 

## 2020-06-06 NOTE — Telephone Encounter (Signed)
Patient contacted regarding discharge from [facility] on 06/02/20.  Patient understands to follow up with provider [facility] on 06/13/20 at 11:45 am at [facility]. Patient understands discharge instructions? yes  Patient understands medications and regiment? yes  Patient understands to bring all medications to this visit? yes

## 2020-06-12 NOTE — Progress Notes (Signed)
Cardiology Office Note:    Date:  06/13/2020   ID:  Kristina Montgomery Jul 17, 1955, MRN [id number]  PCP:  Galvin Proffer, MD  Cardiologist:  Parke Poisson, MD   Referring MD: Galvin Proffer, MD   Chief Complaint  Patient presents with  . Hospitalization Follow-up    AS, CVA    History of Present Illness:    Kristina Montgomery is a 65 y.o. female with a hx of COPD, CAD with NSTEMI 2016, uncontrolled DM, severe AS, HTN, HLD, depression, current smoker, and chronic back pain. Heart cath in 2016 without intervention [facility]).  She was referred to Dr. Jacques Navy in April 2022 following an echocardiogram that showed severe aortic valve stenosis. She was then referred to the structural heart team and saw Dr. Clifton James 05/23/20 who arranged for TAVR workup, including right and left heart cath. She presented to short stay 06/01/20 for schedule heart cath; however, staff noted patient was very sedated. Her son confirmed she took home oxycodone that morning. She appears neurologically intact; however, cath was canceled due to altered mental status. Head CT showed acute stroke in the mid left occipital lobe near parieto-occipital junction. Neurology consulted and suspected acute infarct.  MRI brain showed 2 acute/early subacute infarcts in the left frontoparietal subcortical white matter. Carotid dopplers with mild nonobstructive disease bilaterally. Echo showed EF 60%, grade 1 DD, moderately dilated LA. She was discharged on ASA and plavix x 21 days, then ASA alone. She was started on 80 mg lipitor. A1c was 11.1%. UDS positive for THC. No arrhythmia noted on telemetry, per medicine discharge summary.   Recent CVA. Presents for follow up and to arrange TEE and implantable loop recorder.   She presents for follow up. When I entered the room, she described ongoing chest pain/tightness. She reports chest tightness that started 2 days ago. CP started when she was smoking a cigarette. She is able to walk in the mall  and last did so on Sunday without chest pain. She had CP later that day when smoking. She denies chest pain with activity, generally is occurring at rest. CP lasted about 10 minutes before resolving, rated as a 8/10. CP occurs 2 times per day since Sunday. She has not had this CP before. She also reports dyspnea not related to CP over the last two days. She also report orthopnea and PND.  She has not taken ASA in a couple of days. She also has not picked up the plavix prescription following recent discharge. She does not know what diabetes medications she is taking.  ACS risk factors include obesity, HTN, HLD, DM, and current smoker. FH of CAD in her mother starting in her 30s. She has known CAD with heart cath in 2016 at [facility] showing nonobstructive disease.    Past Medical History:  Diagnosis Date  . Anxiety   . Asthma   . Chest pain   . COPD (chronic obstructive pulmonary disease) (HCC)   . Coronary artery disease   . Depression   . Diabetes mellitus without complication (HCC)   . Dizziness   . Emphysema of lung (HCC)   . Fatigue   . Fever   . Fibromyalgia   . Heart murmur   . Hypertension   . Mixed hyperlipidemia   . Muscle pain   . Rash    foot  . Swelling     Past Surgical History:  Procedure Laterality Date  . CHOLECYSTECTOMY  Current Medications: Current Meds  Medication Sig  . Desvenlafaxine Succinate ER 25 MG TB24 Take 50 mg by mouth daily.  Marland Kitchen. doxepin (SINEQUAN) 75 MG capsule Take 300 mg by mouth at bedtime.  Marland Kitchen. levothyroxine (SYNTHROID) 25 MCG tablet Take 25 mcg by mouth daily before breakfast.  . ondansetron (ZOFRAN-ODT) 8 MG disintegrating tablet Take 8 mg by mouth every 8 (eight) hours as needed for nausea or vomiting.  Marland Kitchen. oxyCODONE 30 MG 12 hr tablet Take 1 tablet by mouth every 12 (twelve) hours.  . pioglitazone (ACTOS) 45 MG tablet Take 45 mg by mouth daily.  . rosuvastatin (CRESTOR) 40 MG tablet Take 40 mg by mouth daily.  .  [DISCONTINUED] aspirin EC 81 MG tablet Take 1 tablet (81 mg total) by mouth daily. Swallow whole.  . [DISCONTINUED] atorvastatin (LIPITOR) 80 MG tablet Take 1 tablet (80 mg total) by mouth daily.  . [DISCONTINUED] clopidogrel (PLAVIX) 75 MG tablet Take 1 tablet (75 mg total) by mouth daily for 21 days.  . [DISCONTINUED] venlafaxine (EFFEXOR) 50 MG tablet Take 50 mg by mouth 2 (two) times daily.     Allergies:   Codeine   Social History   Socioeconomic History  . Marital status: Widowed    Spouse name: Not on file  . Number of children: 4  . Years of education: Not on file  . Highest education level: Not on file  Occupational History  . Occupation: On disability for 20 years  Tobacco Use  . Smoking status: Current Every Day Smoker    Packs/day: 1.00    Years: 35.00    Pack years: 35.00    Types: Cigarettes  . Smokeless tobacco: Never Used  Vaping Use  . Vaping Use: Never used  Substance and Sexual Activity  . Alcohol use: No  . Drug use: No  . Sexual activity: Not on file  Other Topics Concern  . Not on file  Social History Narrative  . Not on file   Social Determinants of Health   Financial Resource Strain: Not on file  Food Insecurity: Not on file  Transportation Needs: Not on file  Physical Activity: Not on file  Stress: Not on file  Social Connections: Not on file     Family History: The patient's family history includes Cancer in her father; Heart disease in her mother.  ROS:   Please see the history of present illness.     All other systems reviewed and are negative.  EKGs/Labs/Other Studies Reviewed:    The following studies were reviewed today:  Echo 512/22: 1. Left ventricular ejection fraction, by estimation, is 60 to 65%. The  left ventricle has normal function. The left ventricle has no regional  wall motion abnormalities. There is moderate left ventricular hypertrophy.  Left ventricular diastolic  parameters are consistent with Grade I  diastolic dysfunction (impaired  relaxation).  2. Right ventricular systolic function is normal. The right ventricular  size is normal. There is normal pulmonary artery systolic pressure.  3. Left atrial size was moderately dilated.  4. The mitral valve is grossly normal. Mild mitral valve regurgitation.  5. The aortic valve was not well visualized. Aortic valve regurgitation  is trivial.  6. The inferior vena cava is normal in size with greater than 50%  respiratory variability, suggesting right atrial pressure of 3 mmHg.   EKG:  EKG is ordered today.  The ekg ordered today demonstrates sinus rhythm HR 97, RBBB (old)  Recent Labs: 06/01/2020: Hemoglobin 14.4; Platelets 265  06/02/2020: BUN 14; Creatinine, Ser 0.74; Potassium 4.1; Sodium 132  Recent Lipid Panel    Component Value Date/Time   CHOL 207 (H) 06/01/2020 1404   TRIG 235 (H) 06/01/2020 1404   HDL 46 06/01/2020 1404   CHOLHDL 4.5 06/01/2020 1404   VLDL 47 (H) 06/01/2020 1404   LDLCALC 114 (H) 06/01/2020 1404    Physical Exam:    VS:  BP (!) 132/58 (BP Location: Left Arm, Patient Position: Sitting, Cuff Size: Normal)   Pulse 99   Ht 5\' 5"  (1.651 m)   Wt 181 lb 9.6 oz (82.4 kg)   SpO2 95%   BMI 30.22 kg/m     Wt Readings from Last 3 Encounters:  06/13/20 181 lb 9.6 oz (82.4 kg)  06/01/20 184 lb 4.9 oz (83.6 kg)  05/23/20 188 lb (85.3 kg)     GEN: obese female in no acute distress HEENT: Normal NECK: No JVD; No carotid bruits LYMPHATICS: No lymphadenopathy CARDIAC: RRR, 4/6 systolic murmur RESPIRATORY:  Scattered wheezing ABDOMEN: Soft, non-tender, non-distended MUSCULOSKELETAL:  No edema; No deformity  SKIN: Warm and dry NEUROLOGIC:  Alert and oriented x 3 PSYCHIATRIC:  Normal affect   ASSESSMENT:    1. Chest pain of uncertain etiology   2. Cerebrovascular accident (CVA) due to thrombosis of left middle cerebral artery (HCC)   3. Primary hypertension   4. Hyperlipidemia, unspecified hyperlipidemia  type   5. Right bundle branch block   6. Severe aortic stenosis   7. History of COPD   8. Tobacco abuse   9. Type 2 diabetes mellitus with hyperlipidemia (HCC)    PLAN:    In order of problems listed above:  Chest pain concerning for angina - when I entered the room, she described chest tightness rated 8/10 with shortness of breath concerning for angina - EKG shows sinus rhythm with RBBB (old) - she has a history of known CAD (cath in 2016) and risk factors including HTN, HLD, uncontrolled DM, smoking, obesity, and family history of premature heart disease - I have asked them to proceed directly to 2017 ER for evaluation (son will drive her) - she has not been taking ASA and did not pick up plavix prescription following her stroke (she states she is only taking the medications in the bag she brought, neither are in the bag) - I have made Cardmaster and ER triage aware, she will be evaluated first in the ER - cardiology will be available if needed, per EDP   Recent CVA - suspect embolic event - no arrhythmia on telemetry, but dilated left atrium on echo  - pending TEE and loop recorder - will defer this for now since I am sending her for ER evaluation   Hypertension - controlled today on 5 mg amlodipine    Hyperlipidemia with LDL goal < 70 06/01/2020: Cholesterol 207; HDL 46; LDL Cholesterol 114; Triglycerides 235; VLDL 47 - on 80 mg lipitor - but meds brought with her include 20 mg crestor   Severe aortic stenosis - confirmed on echo - has been evaluated by TAVR team - awaiting right and left heart cath and CT imaging - Dr. 08/01/2020 has deferred further workup until next visit in 3 months   Smoker COPD - encouraged cessation   Diabetes mellitus type 2 with hyperglycemia - A1c was 11.1% - currently on januvia and "an injectable" - she couldn't remember the name (possibly ozempic? Listed on med list - no longer taking actos - if admitted, would  benefit form  diabetes coordinator consult   She does not have good insight into her health or current medications. She denies ASA and states she is only taking the medications she brought today, which did not include plavix or lipitor She will proceed directly to [facility].    Medication Adjustments/Labs and Tests Ordered: Current medicines are reviewed at length with the patient today.  Concerns regarding medicines are outlined above.  No orders of the defined types were placed in this encounter.  No orders of the defined types were placed in this encounter.   Signed, Marcelino Duster, [place]  06/13/2020 12:48 PM    [facility]

## 2020-06-13 ENCOUNTER — Other Ambulatory Visit: Payer: Self-pay

## 2020-06-13 ENCOUNTER — Inpatient Hospital Stay (HOSPITAL_COMMUNITY)
Admission: EM | Admit: 2020-06-13 | Discharge: 2020-06-15 | DRG: 287 | Disposition: A | Discharge status: Home / Self Care | Payer: Medicare Other | Attending: Cardiology | Admitting: Cardiology

## 2020-06-13 ENCOUNTER — Other Ambulatory Visit (HOSPITAL_COMMUNITY): Payer: Medicare Other

## 2020-06-13 ENCOUNTER — Ambulatory Visit (INDEPENDENT_AMBULATORY_CARE_PROVIDER_SITE_OTHER): Payer: Medicare Other | Admitting: Physician Assistant

## 2020-06-13 ENCOUNTER — Emergency Department (HOSPITAL_COMMUNITY): Payer: Medicare Other

## 2020-06-13 ENCOUNTER — Encounter: Payer: Self-pay | Admitting: Physician Assistant

## 2020-06-13 ENCOUNTER — Encounter (HOSPITAL_COMMUNITY): Payer: Self-pay | Admitting: Emergency Medicine

## 2020-06-13 VITALS — BP 132/58 | HR 99 | Ht 65.0 in | Wt 181.6 lb

## 2020-06-13 DIAGNOSIS — F17210 Nicotine dependence, cigarettes, uncomplicated: Secondary | ICD-10-CM | POA: Diagnosis present

## 2020-06-13 DIAGNOSIS — M549 Dorsalgia, unspecified: Secondary | ICD-10-CM | POA: Diagnosis present

## 2020-06-13 DIAGNOSIS — I63312 Cerebral infarction due to thrombosis of left middle cerebral artery: Secondary | ICD-10-CM | POA: Diagnosis not present

## 2020-06-13 DIAGNOSIS — I4510 Unspecified right bundle-branch block: Secondary | ICD-10-CM | POA: Diagnosis present

## 2020-06-13 DIAGNOSIS — Z79899 Other long term (current) drug therapy: Secondary | ICD-10-CM | POA: Diagnosis not present

## 2020-06-13 DIAGNOSIS — E782 Mixed hyperlipidemia: Secondary | ICD-10-CM | POA: Diagnosis present

## 2020-06-13 DIAGNOSIS — R079 Chest pain, unspecified: Secondary | ICD-10-CM

## 2020-06-13 DIAGNOSIS — E1169 Type 2 diabetes mellitus with other specified complication: Secondary | ICD-10-CM | POA: Diagnosis present

## 2020-06-13 DIAGNOSIS — I10 Essential (primary) hypertension: Secondary | ICD-10-CM | POA: Diagnosis present

## 2020-06-13 DIAGNOSIS — I25110 Atherosclerotic heart disease of native coronary artery with unstable angina pectoris: Secondary | ICD-10-CM | POA: Diagnosis present

## 2020-06-13 DIAGNOSIS — Z20822 Contact with and (suspected) exposure to covid-19: Secondary | ICD-10-CM | POA: Diagnosis present

## 2020-06-13 DIAGNOSIS — G8929 Other chronic pain: Secondary | ICD-10-CM | POA: Diagnosis present

## 2020-06-13 DIAGNOSIS — E039 Hypothyroidism, unspecified: Secondary | ICD-10-CM | POA: Diagnosis present

## 2020-06-13 DIAGNOSIS — E669 Obesity, unspecified: Secondary | ICD-10-CM | POA: Diagnosis present

## 2020-06-13 DIAGNOSIS — Z8673 Personal history of transient ischemic attack (TIA), and cerebral infarction without residual deficits: Secondary | ICD-10-CM

## 2020-06-13 DIAGNOSIS — I200 Unstable angina: Secondary | ICD-10-CM | POA: Diagnosis present

## 2020-06-13 DIAGNOSIS — E785 Hyperlipidemia, unspecified: Secondary | ICD-10-CM | POA: Diagnosis present

## 2020-06-13 DIAGNOSIS — Z8709 Personal history of other diseases of the respiratory system: Secondary | ICD-10-CM

## 2020-06-13 DIAGNOSIS — J439 Emphysema, unspecified: Secondary | ICD-10-CM | POA: Diagnosis present

## 2020-06-13 DIAGNOSIS — M797 Fibromyalgia: Secondary | ICD-10-CM | POA: Diagnosis present

## 2020-06-13 DIAGNOSIS — Z8249 Family history of ischemic heart disease and other diseases of the circulatory system: Secondary | ICD-10-CM | POA: Diagnosis not present

## 2020-06-13 DIAGNOSIS — I252 Old myocardial infarction: Secondary | ICD-10-CM

## 2020-06-13 DIAGNOSIS — Z6830 Body mass index (BMI) 30.0-30.9, adult: Secondary | ICD-10-CM

## 2020-06-13 DIAGNOSIS — F32A Depression, unspecified: Secondary | ICD-10-CM | POA: Diagnosis present

## 2020-06-13 DIAGNOSIS — F419 Anxiety disorder, unspecified: Secondary | ICD-10-CM | POA: Diagnosis present

## 2020-06-13 DIAGNOSIS — I350 Nonrheumatic aortic (valve) stenosis: Secondary | ICD-10-CM

## 2020-06-13 DIAGNOSIS — J449 Chronic obstructive pulmonary disease, unspecified: Secondary | ICD-10-CM | POA: Diagnosis not present

## 2020-06-13 DIAGNOSIS — E118 Type 2 diabetes mellitus with unspecified complications: Secondary | ICD-10-CM | POA: Diagnosis not present

## 2020-06-13 DIAGNOSIS — E1165 Type 2 diabetes mellitus with hyperglycemia: Secondary | ICD-10-CM | POA: Diagnosis not present

## 2020-06-13 DIAGNOSIS — I2510 Atherosclerotic heart disease of native coronary artery without angina pectoris: Secondary | ICD-10-CM | POA: Diagnosis not present

## 2020-06-13 DIAGNOSIS — Z720 Tobacco use: Secondary | ICD-10-CM

## 2020-06-13 LAB — CBC
HCT: 44.4 % (ref 36.0–46.0)
HCT: 46.7 % — ABNORMAL HIGH (ref 36.0–46.0)
Hemoglobin: 14.6 g/dL (ref 12.0–15.0)
Hemoglobin: 15.6 g/dL — ABNORMAL HIGH (ref 12.0–15.0)
MCH: 30.5 pg (ref 26.0–34.0)
MCH: 30.8 pg (ref 26.0–34.0)
MCHC: 32.9 g/dL (ref 30.0–36.0)
MCHC: 33.4 g/dL (ref 30.0–36.0)
MCV: 92.7 fL (ref 80.0–100.0)
MCV: 92.3 fL (ref 80.0–100.0)
Platelets: 318 10*3/uL (ref 150–400)
Platelets: 318 10*3/uL (ref 150–400)
RBC: 4.79 MIL/uL (ref 3.87–5.11)
RBC: 5.06 MIL/uL (ref 3.87–5.11)
RDW: 13.7 % (ref 11.5–15.5)
RDW: 13.8 % (ref 11.5–15.5)
WBC: 8.9 10*3/uL (ref 4.0–10.5)
WBC: 9.3 10*3/uL (ref 4.0–10.5)
nRBC: 0 % (ref 0.0–0.2)
nRBC: 0 % (ref 0.0–0.2)

## 2020-06-13 LAB — BASIC METABOLIC PANEL
Anion gap: 7 (ref 5–15)
Anion gap: 11 (ref 5–15)
BUN: 19 mg/dL (ref 8–23)
BUN: 16 mg/dL (ref 8–23)
CO2: 29 mmol/L (ref 22–32)
CO2: 25 mmol/L (ref 22–32)
Calcium: 9.5 mg/dL (ref 8.9–10.3)
Calcium: 9.7 mg/dL (ref 8.9–10.3)
Chloride: 93 mmol/L — ABNORMAL LOW (ref 98–111)
Chloride: 98 mmol/L (ref 98–111)
Creatinine, Ser: 0.94 mg/dL (ref 0.44–1.00)
Creatinine, Ser: 0.67 mg/dL (ref 0.44–1.00)
GFR, Estimated: >60 mL/min (ref >60)
GFR, Estimated: >60 mL/min (ref >60)
Glucose, Bld: 306 mg/dL — ABNORMAL HIGH (ref 70–99)
Glucose, Bld: 177 mg/dL — ABNORMAL HIGH (ref 70–99)
Potassium: 4.7 mmol/L (ref 3.5–5.1)
Potassium: 4.1 mmol/L (ref 3.5–5.1)
Sodium: 129 mmol/L — ABNORMAL LOW (ref 135–145)
Sodium: 134 mmol/L — ABNORMAL LOW (ref 135–145)

## 2020-06-13 LAB — TROPONIN I (HIGH SENSITIVITY)
Troponin I (High Sensitivity): 34 ng/L — ABNORMAL HIGH (ref <18)
Troponin I (High Sensitivity): 38 ng/L — ABNORMAL HIGH (ref <18)
Troponin I (High Sensitivity): 38 ng/L — ABNORMAL HIGH (ref <18)

## 2020-06-13 LAB — TSH: TSH: 22.202 u[IU]/mL — ABNORMAL HIGH (ref 0.350–4.500)

## 2020-06-13 LAB — RESP PANEL BY RT-PCR (FLU A&B, COVID) ARPGX2
Influenza A by PCR: NEGATIVE
Influenza B by PCR: NEGATIVE
SARS Coronavirus 2 by RT PCR: NEGATIVE

## 2020-06-13 LAB — MAGNESIUM: Magnesium: 2.2 mg/dL (ref 1.7–2.4)

## 2020-06-13 MED ORDER — DESVENLAFAXINE SUCCINATE ER 25 MG PO TB24: 50.0000 mg | ORAL_TABLET | Freq: Every day | ORAL | Status: DC

## 2020-06-13 MED ORDER — PIOGLITAZONE HCL 30 MG PO TABS
45.0000 mg | ORAL_TABLET | Freq: Every day | ORAL | Status: DC
  Filled 2020-06-13: qty 1

## 2020-06-13 MED ORDER — SODIUM CHLORIDE 0.9 % IV SOLN: INTRAVENOUS | Status: DC

## 2020-06-13 MED ORDER — LEVOTHYROXINE SODIUM 25 MCG PO TABS
25.0000 ug | ORAL_TABLET | Freq: Every day | ORAL | Status: DC
  Administered 2020-06-14: 25 ug via ORAL
  Filled 2020-06-13: qty 1

## 2020-06-13 MED ORDER — SODIUM CHLORIDE 0.9 % WEIGHT BASED INFUSION
1.0000 mL/kg/h | INTRAVENOUS | Status: DC
  Administered 2020-06-14: 1 mL/kg/h via INTRAVENOUS

## 2020-06-13 MED ORDER — SODIUM CHLORIDE 0.9 % WEIGHT BASED INFUSION
3.0000 mL/kg/h | INTRAVENOUS | Status: DC
  Administered 2020-06-14: 3 mL/kg/h via INTRAVENOUS

## 2020-06-13 MED ORDER — ACETAMINOPHEN 325 MG PO TABS: 650.0000 mg | ORAL_TABLET | ORAL | Status: DC | PRN

## 2020-06-13 MED ORDER — ASPIRIN 81 MG PO CHEW
324.0000 mg | CHEWABLE_TABLET | ORAL | Status: AC
  Administered 2020-06-13: 324 mg via ORAL
  Filled 2020-06-13: qty 4

## 2020-06-13 MED ORDER — HEPARIN SODIUM (PORCINE) 5000 UNIT/ML IJ SOLN
5000.0000 [IU] | Freq: Three times a day (TID) | INTRAMUSCULAR | Status: DC
Start: 2020-06-13 — End: 2020-06-15
  Administered 2020-06-13 – 2020-06-15 (×4): 5000 [IU] via SUBCUTANEOUS
  Filled 2020-06-13 (×4): qty 1

## 2020-06-13 MED ORDER — METOPROLOL TARTRATE 25 MG PO TABS
25.0000 mg | ORAL_TABLET | Freq: Two times a day (BID) | ORAL | Status: DC
  Administered 2020-06-13 – 2020-06-15 (×4): 25 mg via ORAL
  Filled 2020-06-13 (×4): qty 1

## 2020-06-13 MED ORDER — OXYCODONE HCL ER 30 MG PO T12A: 1.0000 | EXTENDED_RELEASE_TABLET | Freq: Two times a day (BID) | ORAL | Status: DC

## 2020-06-13 MED ORDER — ASPIRIN 300 MG RE SUPP: 300.0000 mg | RECTAL | Status: AC

## 2020-06-13 MED ORDER — ROSUVASTATIN CALCIUM 20 MG PO TABS
40.0000 mg | ORAL_TABLET | Freq: Every day | ORAL | Status: DC
  Administered 2020-06-14 – 2020-06-15 (×2): 40 mg via ORAL
  Filled 2020-06-13 (×2): qty 2

## 2020-06-13 MED ORDER — VENLAFAXINE HCL ER 75 MG PO CP24
75.0000 mg | ORAL_CAPSULE | Freq: Every day | ORAL | Status: DC
  Administered 2020-06-14 – 2020-06-15 (×2): 75 mg via ORAL
  Filled 2020-06-13 (×2): qty 1

## 2020-06-13 MED ORDER — OXYCODONE HCL ER 10 MG PO T12A
30.0000 mg | EXTENDED_RELEASE_TABLET | Freq: Two times a day (BID) | ORAL | Status: DC
  Administered 2020-06-13 – 2020-06-15 (×4): 30 mg via ORAL
  Filled 2020-06-13 (×4): qty 3

## 2020-06-13 MED ORDER — ROSUVASTATIN CALCIUM 20 MG PO TABS: 40.0000 mg | ORAL_TABLET | Freq: Every day | ORAL | Status: DC

## 2020-06-13 MED ORDER — LEVOTHYROXINE SODIUM 25 MCG PO TABS: 25.0000 ug | ORAL_TABLET | Freq: Every day | ORAL | Status: DC

## 2020-06-13 MED ORDER — ONDANSETRON HCL 4 MG/2ML IJ SOLN
4.0000 mg | Freq: Four times a day (QID) | INTRAMUSCULAR | Status: DC | PRN
  Administered 2020-06-15: 4 mg via INTRAVENOUS
  Filled 2020-06-13: qty 2

## 2020-06-13 MED ORDER — SODIUM CHLORIDE 0.9 % IV SOLN: 250.0000 mL | INTRAVENOUS | Status: DC | PRN

## 2020-06-13 MED ORDER — ASPIRIN 81 MG PO CHEW
81.0000 mg | CHEWABLE_TABLET | ORAL | Status: AC
  Administered 2020-06-14: 81 mg via ORAL
  Filled 2020-06-13 (×2): qty 1

## 2020-06-13 MED ORDER — DOXEPIN HCL 50 MG PO CAPS
300.0000 mg | ORAL_CAPSULE | Freq: Every day | ORAL | Status: DC
  Administered 2020-06-13: 300 mg via ORAL
  Filled 2020-06-13: qty 4
  Filled 2020-06-13: qty 3
  Filled 2020-06-13: qty 6

## 2020-06-13 MED ORDER — NITROGLYCERIN 0.4 MG SL SUBL
0.4000 mg | SUBLINGUAL_TABLET | SUBLINGUAL | Status: DC | PRN
  Administered 2020-06-13 (×2): 0.4 mg via SUBLINGUAL
  Filled 2020-06-13: qty 1

## 2020-06-13 MED ORDER — ONDANSETRON 4 MG PO TBDP
8.0000 mg | ORAL_TABLET | Freq: Three times a day (TID) | ORAL | Status: DC | PRN
  Filled 2020-06-13: qty 2

## 2020-06-13 MED ORDER — SODIUM CHLORIDE 0.9% FLUSH
3.0000 mL | Freq: Two times a day (BID) | INTRAVENOUS | Status: DC
Start: 2020-06-13 — End: 2020-06-15

## 2020-06-13 MED ORDER — ASPIRIN EC 81 MG PO TBEC
81.0000 mg | DELAYED_RELEASE_TABLET | Freq: Every day | ORAL | Status: DC
  Administered 2020-06-14 – 2020-06-15 (×2): 81 mg via ORAL
  Filled 2020-06-13 (×3): qty 1

## 2020-06-13 MED ORDER — SODIUM CHLORIDE 0.9% FLUSH: 3.0000 mL | INTRAVENOUS | Status: DC | PRN

## 2020-06-13 NOTE — Addendum Note (Signed)
Addended by: Alyson Ingles on: 06/13/2020 12:53 PM   Modules accepted: Orders

## 2020-06-13 NOTE — ED Provider Notes (Signed)
[facility] EMERGENCY DEPARTMENT Provider Note   CSN: [id number] Arrival date & time: 06/13/20  1246     History Chief Complaint  Patient presents with  . Chest Pain  . Loss of Consciousness    Kristina Montgomery is a 65 y.o. female.  65 year old female with prior medical history as detailed below presents for evaluation.  Patient reports ongoing intermittent chest discomfort for the last 2 days.  She denies association of chest discomfort with exertion.  She apparently is able to perform her daily activities without significant disruption.  She denies shortness of breath associated with her chest discomfort.  She denies recent fever or cough.  She denies current chest discomfort.  She was seen by her cardiologist this morning and sent to the ED for evaluation.  Of note, patient was admitted last week for treatment of CVA.  She was discharged on aspirin and Plavix.  Patient apparently has not yet picked up her Plavix prescription.  It is unclear when she last took her aspirin.  The history is provided by the patient and medical records.  Chest Pain Pain location:  Substernal area Pain quality: pressure   Pain radiates to:  Does not radiate Pain severity:  Mild Onset quality:  Gradual Timing:  Intermittent Progression:  Waxing and waning Chronicity:  New Relieved by:  Nothing Worsened by:  Nothing      Past Medical History:  Diagnosis Date  . Anxiety   . Asthma   . Chest pain   . COPD (chronic obstructive pulmonary disease) (HCC)   . Coronary artery disease   . Depression   . Diabetes mellitus without complication (HCC)   . Dizziness   . Emphysema of lung (HCC)   . Fatigue   . Fever   . Fibromyalgia   . Heart murmur   . Hypertension   . Mixed hyperlipidemia   . Muscle pain   . Rash    foot  . Swelling     Patient Active Problem List   Diagnosis Date Noted  . LOC (loss of consciousness) (HCC) 06/01/2020  . CVA (cerebral vascular accident)  (HCC) 06/01/2020  . Aortic stenosis 06/01/2020  . History of COPD 06/01/2020  . Type 2 diabetes mellitus with hyperlipidemia (HCC) 06/01/2020  . Tobacco abuse 06/01/2020    Past Surgical History:  Procedure Laterality Date  . CHOLECYSTECTOMY       OB History   No obstetric history on file.     Family History  Problem Relation Age of Onset  . Heart disease Mother   . Cancer Father     Social History   Tobacco Use  . Smoking status: Current Every Day Smoker    Packs/day: 1.00    Years: 35.00    Pack years: 35.00    Types: Cigarettes  . Smokeless tobacco: Never Used  Vaping Use  . Vaping Use: Never used  Substance Use Topics  . Alcohol use: No  . Drug use: No    Home Medications Prior to Admission medications   Medication Sig Start Date End Date Taking? Authorizing Provider  Desvenlafaxine Succinate ER 25 MG TB24 Take 50 mg by mouth daily.    [provider]  doxepin (SINEQUAN) 75 MG capsule Take 300 mg by mouth at bedtime.    [provider]  levothyroxine (SYNTHROID) 25 MCG tablet Take 25 mcg by mouth daily before breakfast.    [provider]  ondansetron (ZOFRAN-ODT) 8 MG disintegrating tablet Take 8  mg by mouth every 8 (eight) hours as needed for nausea or vomiting.    [provider]  oxyCODONE 30 MG 12 hr tablet Take 1 tablet by mouth every 12 (twelve) hours.    [provider]  pioglitazone (ACTOS) 45 MG tablet Take 45 mg by mouth daily.    [provider]  rosuvastatin (CRESTOR) 40 MG tablet Take 40 mg by mouth daily.    [provider]    Allergies    Codeine  Review of Systems   Review of Systems  Cardiovascular: Positive for chest pain.  All other systems reviewed and are negative.   Physical Exam Updated Vital Signs BP 136/63 (BP Location: Left Arm)   Pulse 77   Temp 97.8 F (36.6 C) (Oral)   Resp 15   Ht 5\' 5"  (1.651 m)   Wt 82.1 kg   SpO2 98%   BMI 30.12 kg/m   Physical  Exam Vitals and nursing note reviewed.  Constitutional:      General: She is not in acute distress.    Appearance: She is well-developed.  HENT:     Head: Normocephalic and atraumatic.  Eyes:     Conjunctiva/sclera: Conjunctivae normal.     Pupils: Pupils are equal, round, and reactive to light.  Cardiovascular:     Rate and Rhythm: Normal rate and regular rhythm.     Heart sounds: Normal heart sounds.  Pulmonary:     Effort: Pulmonary effort is normal. No respiratory distress.     Breath sounds: Normal breath sounds.  Abdominal:     General: There is no distension.     Palpations: Abdomen is soft.     Tenderness: There is no abdominal tenderness.  Musculoskeletal:        General: No deformity. Normal range of motion.     Cervical back: Normal range of motion and neck supple.  Skin:    General: Skin is warm and dry.  Neurological:     Mental Status: She is alert and oriented to person, place, and time.     ED Results / Procedures / Treatments   Labs (all labs ordered are listed, but only abnormal results are displayed) Labs Reviewed  BASIC METABOLIC PANEL - Abnormal; Notable for the following components:      Result Value   Sodium 129 (*)    Chloride 93 (*)    Glucose, Bld 306 (*)    All other components within normal limits  TROPONIN I (HIGH SENSITIVITY) - Abnormal; Notable for the following components:   Troponin I (High Sensitivity) 34 (*)    All other components within normal limits  TROPONIN I (HIGH SENSITIVITY) - Abnormal; Notable for the following components:   Troponin I (High Sensitivity) 38 (*)    All other components within normal limits  RESP PANEL BY RT-PCR (FLU A&B, COVID) ARPGX2  CBC  BASIC METABOLIC PANEL  CBC  MAGNESIUM  TSH  BASIC METABOLIC PANEL  LIPID PANEL  CBC  TROPONIN I (HIGH SENSITIVITY)  TROPONIN I (HIGH SENSITIVITY)    EKG None  Radiology DG Chest 2 View  Result Date: 06/13/2020 CLINICAL DATA:  Chest pain. EXAM: CHEST - 2  VIEW COMPARISON:  Chest x-ray 06/01/2020. FINDINGS: Mediastinum hilar structures normal. Cardiomegaly. No pulmonary venous congestion. Low lung volumes with mild bibasilar atelectasis/infiltrates. No pleural effusion or pneumothorax. Degenerative change thoracic spine. IMPRESSION: 1.  Cardiomegaly.  No pulmonary venous congestion. 2.  Low lung volumes with mild bibasilar atelectasis/infiltrates. Electronically Signed  ByMaisie Fus  Register   On: 06/13/2020 14:01    Procedures Procedures   Medications Ordered in ED Medications - No data to display  ED Course  I have reviewed the triage vital signs and the nursing notes.  Pertinent labs & imaging results that were available during my care of the patient were reviewed by me and considered in my medical decision making (see chart for details).    MDM Rules/Calculators/A&P                          MDM  MSE complete  CHLORIS MARCOUX was evaluated in Emergency Department on 06/13/2020 for the symptoms described in the history of present illness. She was evaluated in the context of the global COVID-19 pandemic, which necessitated consideration that the patient might be at risk for infection with the SARS-CoV-2 virus that causes COVID-19. Institutional protocols and algorithms that pertain to the evaluation of patients at risk for COVID-19 are in a state of rapid change based on information released by regulatory bodies including the CDC and federal and state organizations. These policies and algorithms were followed during the patient's care in the ED.   Patient presented from her cardiologist office for evaluation of reported chest discomfort.  Patient without active chest pain on arrival.  Initial EKG is without evidence of acute ischemia.  Cardiology is aware of case and will evaluate for admission.   Final Clinical Impression(s) / ED Diagnoses Final diagnoses:  Chest pain, unspecified type    Rx / DC Orders ED Discharge Orders     None       Wynetta Fines, MD 06/13/20 2204

## 2020-06-13 NOTE — ED Triage Notes (Signed)
Pt reports two days of chest tightness and pain. Pt has hx of syncope within the last week, needs heart cath. Sent here for further eval. Pt reports mild SOB.

## 2020-06-13 NOTE — Patient Instructions (Signed)
Special Instructions GO TO [facility] DIRECTLY  At [facility], you and your health needs are our priority.  As part of our continuing mission to provide you with exceptional heart care, we have created designated Provider Care Teams.  These Care Teams include your primary Cardiologist (physician) and Advanced Practice Providers (APPs -  Physician Assistants and Nurse Practitioners) who all work together to provide you with the care you need, when you need it.  We recommend signing up for the patient portal called "MyChart".  Sign up information is provided on this After Visit Summary.  MyChart is used to connect with patients for Virtual Visits (Telemedicine).  Patients are able to view lab/test results, encounter notes, upcoming appointments, etc.  Non-urgent messages can be sent to your provider as well.   To learn more about what you can do with MyChart, go to [email or web address].

## 2020-06-13 NOTE — Progress Notes (Signed)
Upon arrival to unit, pt advised she was having 10/10 chest and back pain. EKG was obtained and pt given 2 SL nitros. Pt reports pain is now 0/10. Cards fellow notified

## 2020-06-13 NOTE — H&P (Addendum)
History and Physical   This is [name] note from today 06/13/20  ADMIT 06/13/20   PCP:  Galvin Proffer, MD     Cardiologist:  Parke Poisson, MD   Chief complaint:   chest pain and she had CVA 2 weeks ago.      History of Present Illness:    Kristina Montgomery is a 65 y.o. female with a hx of COPD, CAD with NSTEMI 2016, uncontrolled DM, severe AS, HTN, HLD, depression, current smoker, and chronic back pain. Heart cath in 2016 without intervention [facility]).  She was referred to Dr. Jacques Navy in April 2022 following an echocardiogram that showed severe aortic valve stenosis. She was then referred to the structural heart team and saw Dr. Clifton James 05/23/20 who arranged for TAVR workup, including right and left heart cath. She presented to short stay 06/01/20 for schedule heart cath; however, staff noted patient was very sedated. Her son confirmed she took home oxycodone that morning. She appears neurologically intact; however, cath was canceled due to altered mental status. Head CT showed acute stroke in the mid left occipital lobe near parieto-occipital junction. Neurology consulted and suspected acute infarct.  MRI brain showed 2 acute/early subacute infarcts in the left frontoparietal subcortical white matter. Carotid dopplers with mild nonobstructive disease bilaterally. Echo showed EF 60%, grade 1 DD, moderately dilated LA. She was discharged on ASA and plavix x 21 days, then ASA alone. She was started on 80 mg lipitor. A1c was 11.1%. UDS positive for THC. No arrhythmia noted on telemetry, per medicine discharge summary.   Recent CVA. Presents for follow up and to arrange TEE and implantable loop recorder.   She presents for follow up. When I entered the room, she described ongoing chest pain/tightness. She reports chest tightness that started 2 days ago. CP started when she was smoking a cigarette. She is able to walk in the mall and last did so on Sunday without chest pain. She had CP  later that day when smoking. She denies chest pain with activity, generally is occurring at rest. CP lasted about 10 minutes before resolving, rated as a 8/10. CP occurs 2 times per day since Sunday. She has not had this CP before. She also reports dyspnea not related to CP over the last two days. She also report orthopnea and PND.  She has not taken ASA in a couple of days. She also has not picked up the plavix prescription following recent discharge. She does not know what diabetes medications she is taking.  ACS risk factors include obesity, HTN, HLD, DM, and current smoker. FH of CAD in her mother starting in her 30s. She has known CAD with heart cath in 2016 at [facility] showing nonobstructive disease.        Past Medical History:  Diagnosis Date  . Anxiety   . Asthma   . Chest pain   . COPD (chronic obstructive pulmonary disease) (HCC)   . Coronary artery disease   . Depression   . Diabetes mellitus without complication (HCC)   . Dizziness   . Emphysema of lung (HCC)   . Fatigue   . Fever   . Fibromyalgia   . Heart murmur   . Hypertension   . Mixed hyperlipidemia   . Muscle pain   . Rash    foot  . Swelling          Past Surgical History:  Procedure Laterality Date  . CHOLECYSTECTOMY  Current Medications: Active Medications      Current Meds  Medication Sig  . Desvenlafaxine Succinate ER 25 MG TB24 Take 50 mg by mouth daily.  Marland Kitchen. doxepin (SINEQUAN) 75 MG capsule Take 300 mg by mouth at bedtime.  Marland Kitchen. levothyroxine (SYNTHROID) 25 MCG tablet Take 25 mcg by mouth daily before breakfast.  . ondansetron (ZOFRAN-ODT) 8 MG disintegrating tablet Take 8 mg by mouth every 8 (eight) hours as needed for nausea or vomiting.  Marland Kitchen. oxyCODONE 30 MG 12 hr tablet Take 1 tablet by mouth every 12 (twelve) hours.  . pioglitazone (ACTOS) 45 MG tablet Take 45 mg by mouth daily.  . rosuvastatin (CRESTOR) 40 MG tablet Take 40 mg by mouth daily.  .  [DISCONTINUED] aspirin EC 81 MG tablet Take 1 tablet (81 mg total) by mouth daily. Swallow whole.  . [DISCONTINUED] atorvastatin (LIPITOR) 80 MG tablet Take 1 tablet (80 mg total) by mouth daily.  . [DISCONTINUED] clopidogrel (PLAVIX) 75 MG tablet Take 1 tablet (75 mg total) by mouth daily for 21 days.  . [DISCONTINUED] venlafaxine (EFFEXOR) 50 MG tablet Take 50 mg by mouth 2 (two) times daily.       Allergies:   Codeine   Social History        Socioeconomic History  . Marital status: Widowed    Spouse name: Not on file  . Number of children: 4  . Years of education: Not on file  . Highest education level: Not on file  Occupational History  . Occupation: On disability for 20 years  Tobacco Use  . Smoking status: Current Every Day Smoker    Packs/day: 1.00    Years: 35.00    Pack years: 35.00    Types: Cigarettes  . Smokeless tobacco: Never Used  Vaping Use  . Vaping Use: Never used  Substance and Sexual Activity  . Alcohol use: No  . Drug use: No  . Sexual activity: Not on file  Other Topics Concern  . Not on file  Social History Narrative  . Not on file   Social Determinants of Health   Financial Resource Strain: Not on file  Food Insecurity: Not on file  Transportation Needs: Not on file  Physical Activity: Not on file  Stress: Not on file  Social Connections: Not on file     Family History: The patient's family history includes Cancer in her father; Heart disease in her mother.  ROS:   Please see the history of present illness.     All other systems reviewed and are negative.  EKGs/Labs/Other Studies Reviewed:    The following studies were reviewed today:  Echo 512/22: 1. Left ventricular ejection fraction, by estimation, is 60 to 65%. The  left ventricle has normal function. The left ventricle has no regional  wall motion abnormalities. There is moderate left ventricular hypertrophy.  Left ventricular diastolic  parameters are  consistent with Grade I diastolic dysfunction (impaired  relaxation).  2. Right ventricular systolic function is normal. The right ventricular  size is normal. There is normal pulmonary artery systolic pressure.  3. Left atrial size was moderately dilated.  4. The mitral valve is grossly normal. Mild mitral valve regurgitation.  5. The aortic valve was not well visualized. Aortic valve regurgitation  is trivial.  6. The inferior vena cava is normal in size with greater than 50%  respiratory variability, suggesting right atrial pressure of 3 mmHg.   EKG:  EKG is ordered today.  The ekg ordered today demonstrates  sinus rhythm HR 97, RBBB (old)  Recent Labs: 06/01/2020: Hemoglobin 14.4; Platelets 265 06/02/2020: BUN 14; Creatinine, Ser 0.74; Potassium 4.1; Sodium 132  Recent Lipid Panel Labs (Brief)          Component Value Date/Time   CHOL 207 (H) 06/01/2020 1404   TRIG 235 (H) 06/01/2020 1404   HDL 46 06/01/2020 1404   CHOLHDL 4.5 06/01/2020 1404   VLDL 47 (H) 06/01/2020 1404   LDLCALC 114 (H) 06/01/2020 1404      Physical Exam:    VS:  BP (!) 132/58 (BP Location: Left Arm, Patient Position: Sitting, Cuff Size: Normal)   Pulse 99   Ht 5\' 5"  (1.651 m)   Wt 181 lb 9.6 oz (82.4 kg)   SpO2 95%   BMI 30.22 kg/m        Wt Readings from Last 3 Encounters:  06/13/20 181 lb 9.6 oz (82.4 kg)  06/01/20 184 lb 4.9 oz (83.6 kg)  05/23/20 188 lb (85.3 kg)     GEN: obese female in no acute distress; was noting some musculoskeletal pain upon arrival to the ER.  Distress. HEENT: Normal NECK: No JVD; No carotid bruits LYMPHATICS: No lymphadenopathy CARDIAC: RRR, 4/6 late peaking crescendo-decrescendo systolic ejection murmur RESPIRATORY:  Scattered wheezing ABDOMEN: Soft, non-tender, non-distended MUSCULOSKELETAL:  No edema; No deformity  SKIN: Warm and dry NEUROLOGIC:  Alert and oriented x 3 PSYCHIATRIC:  Normal affect    ASSESSMENT:    1. Chest pain of  uncertain etiology   2. Cerebrovascular accident (CVA) due to thrombosis of left middle cerebral artery (HCC)   3. Primary hypertension   4. Hyperlipidemia, unspecified hyperlipidemia type   5. Right bundle branch block   6. Severe aortic stenosis   7. History of COPD   8. Tobacco abuse   9. Type 2 diabetes mellitus with hyperlipidemia (HCC)    PLAN:    In order of problems listed above:  Chest pain concerning for angina - when I entered the room, she described chest tightness rated 8/10 with shortness of breath concerning for angina - EKG shows sinus rhythm with RBBB (old) - she has a history of known CAD (cath in 2016) and risk factors including HTN, HLD, uncontrolled DM, smoking, obesity, and family history of premature heart disease - I have asked them to proceed directly to 2017 ER for evaluation (son will drive her) - she has not been taking ASA and did not pick up plavix prescription following her stroke (she states she is only taking the medications in the bag she brought, neither are in the bag) - I have made Cardmaster and ER triage aware, she will be evaluated first in the ER - cardiology will be available if needed, per EDP --here mildly elevated troponin -ER trend not consistent with ACS despite having significant amount of chest pain.  However, with recurrent episodes of chest pain in a patient with aortic stenosis, need to confirm whether the pain is related to CAD or potentially the aortic stenosis versus nonanginal pain.  Plan had been for her to have right left heart catheterization 2 weeks ago that was delayed because of stroke.  Ischemic evaluation is warranted prior to TAVR, and with her now having recurrent chest pain I think the only course of action we have is to proceed with cardiac catheterization.  Recent CVA - suspect embolic event - no arrhythmia on telemetry, but dilated left atrium on echo  - pending TEE and loop recorder - will  defer this for now  since I am sending her for ER evaluation   Hypertension - controlled today on 5 mg amlodipine    Hyperlipidemia with LDL goal < 70 06/01/2020: Cholesterol 207; HDL 46; LDL Cholesterol 114; Triglycerides 235; VLDL 47 - on 80 mg lipitor - but meds brought with her include 20 mg crestor   Severe aortic stenosis - confirmed on echo - has been evaluated by TAVR team - awaiting right and left heart cath and CT imaging - Dr. Clifton James has deferred further workup until next visit in 3 months  Following last admission, TAVR evaluation has been delayed.  Unfortunately now she is having recurrent symptoms of chest pain which is somewhat atypical for angina, however she is now having episodes of up to 8 out of 10 pain.  With the need for coronary angiogram as part of her pre-TAVR evaluation in the upcoming future, I think the only best course of action will be to go ahead and proceed with her catheterization procedure.  Certainly her TAVR will be delayed based on the stroke, but we need to exclude obstructive CAD as cause of her pain.  We will plan for right left heart catheterization tomorrow.  Shared Decision Making/Informed Consent The risks [stroke (1 in 1000), death (1 in 1000), kidney failure [usually temporary] (1 in 500), bleeding (1 in 200), allergic reaction [possibly serious] (1 in 200)], benefits (diagnostic support and management of coronary artery disease) and alternatives of a cardiac catheterization were discussed in detail with Kristina Montgomery and she is willing to proceed. The additional risks of catheterization in a patient with aortic stenosis were explained.  Concerns for crossing aortic valve and potentially increased risk of stroke are noted, however it is unclear that the risk would be any less now that it would be in a month.  Smoker COPD - encouraged cessation   Diabetes mellitus type 2 with hyperglycemia - A1c was 11.1% - currently on januvia and "an injectable" - she  couldn't remember the name (possibly ozempic? Listed on med list - no longer taking actos - if admitted, would benefit form diabetes coordinator consult   She does not have good insight into her health or current medications. She denies ASA and states she is only taking the medications she brought today, which did not include plavix or lipitor She will proceed directly to [facility].    Medication Adjustments/Labs and Tests Ordered: Current medicines are reviewed at length with the patient today.  Concerns regarding medicines are outlined above.  No orders of the defined types were placed in this encounter.  No orders of the defined types were placed in this encounter.   Signed, Marcelino Duster, [place]  06/13/2020 12:48 PM    [facility]   Pt presented to ER   Na 129, K+ 4.7 CO2 29 glucose 306 Cr 0.94   HS troponin 34 and 38.   Hgb 14.6 WBC 8.9 and plts 318 2V CXR  IMPRESSION: 1.  Cardiomegaly.  No pulmonary venous congestion. 2.  Low lung volumes with mild bibasilar atelectasis/infiltrates.  ATTENDING ATTESTATION  I have seen, examined and evaluated the patient this evening in the ER along with Nada Boozer (after being seen in clinic by Micah Flesher, PA).  After reviewing all the available data and chart, we discussed the patients laboratory, study & physical findings as well as symptoms in detail. I agree with her findings, examination as well as impression recommendations as per our discussion.  Attending adjustments noted in italics.   Difficult situation with a woman who was undergoing evaluation for TAVR and unfortunately presented with stroke.  She now has had several episodes of recurrent chest pain and does warrant ischemic evaluation.  In this setting, I do not think it wise to proceed with a noninvasive test which would simply be superfluous for the fact that [facility] still needs heart catheterization.  Although not favorable to proceed with  catheterization this close to a stroke, she is not having significant residual symptoms, and it was presumed potentially thromboembolic/cardioembolic.  Thankfully, she has ruled out for true ACS but there is still some elevated troponin above 0.  It is not above the threshold to consider ACS, and very well could be related to her aortic stenosis.  She does have significant other noncardiac pains which could be contributing.  She seems extremely anxious and stressed about having to delay her TAVR work-up, and I think the best course of action to exclude ischemic CAD is to proceed with cardiac catheterization.    Bryan Lemma, M.D., M.S. Interventional Cardiologist   Pager # [phone] Phone # [phone] [address] [place], [place] [address]

## 2020-06-13 NOTE — ED Provider Notes (Signed)
Emergency Medicine Provider Triage Evaluation Note  Kristina Montgomery , a 65 y.o. female  was evaluated in triage.  Pt complains of chest pain for the last couple days.  Syncopal episode last week.  Review of Systems  Positive: Chest pain, syncope Negative: Vomiting, cough, fever, abdominal pain  Physical Exam  BP (!) 147/61 (BP Location: Left Arm)   Pulse 91   Temp 97.8 F (36.6 C) (Oral)   Resp 16   SpO2 99%  Gen:   Awake, no distress   Resp:  Normal effort  MSK:   Moves extremities without difficulty  Other:    Medical Decision Making  Medically screening exam initiated at 1:06 PM.  Appropriate orders placed.  Kristina Montgomery was informed that the remainder of the evaluation will be completed by another provider, this initial triage assessment does not replace that evaluation, and the importance of remaining in the ED until their evaluation is complete.     Anselm Pancoast, PA-C 06/13/20 1311    Cathren Laine, MD 06/15/20 1252

## 2020-06-14 ENCOUNTER — Inpatient Hospital Stay (HOSPITAL_COMMUNITY)
Admission: EM | Disposition: A | Discharge status: Home / Self Care | Payer: Self-pay | Source: Home / Self Care | Attending: Cardiology

## 2020-06-14 DIAGNOSIS — E1165 Type 2 diabetes mellitus with hyperglycemia: Secondary | ICD-10-CM

## 2020-06-14 DIAGNOSIS — J449 Chronic obstructive pulmonary disease, unspecified: Secondary | ICD-10-CM

## 2020-06-14 DIAGNOSIS — I2510 Atherosclerotic heart disease of native coronary artery without angina pectoris: Secondary | ICD-10-CM

## 2020-06-14 DIAGNOSIS — I350 Nonrheumatic aortic (valve) stenosis: Secondary | ICD-10-CM

## 2020-06-14 DIAGNOSIS — R079 Chest pain, unspecified: Secondary | ICD-10-CM

## 2020-06-14 DIAGNOSIS — E1169 Type 2 diabetes mellitus with other specified complication: Secondary | ICD-10-CM

## 2020-06-14 DIAGNOSIS — E785 Hyperlipidemia, unspecified: Secondary | ICD-10-CM

## 2020-06-14 DIAGNOSIS — E118 Type 2 diabetes mellitus with unspecified complications: Secondary | ICD-10-CM

## 2020-06-14 HISTORY — PX: RIGHT/LEFT HEART CATH AND CORONARY ANGIOGRAPHY: CATH118266

## 2020-06-14 LAB — POCT I-STAT 7, (LYTES, BLD GAS, ICA,H+H)
Acid-base deficit: 1 mmol/L (ref 0.0–2.0)
Bicarbonate: 23.8 mmol/L (ref 20.0–28.0)
Calcium, Ion: 1.13 mmol/L — ABNORMAL LOW (ref 1.15–1.40)
HCT: 42 % (ref 36.0–46.0)
Hemoglobin: 14.3 g/dL (ref 12.0–15.0)
O2 Saturation: 95 %
Potassium: 3.3 mmol/L — ABNORMAL LOW (ref 3.5–5.1)
Sodium: 140 mmol/L (ref 135–145)
TCO2: 25 mmol/L (ref 22–32)
pCO2 arterial: 38.6 mmHg (ref 32.0–48.0)
pH, Arterial: 7.398 (ref 7.350–7.450)
pO2, Arterial: 76 mmHg — ABNORMAL LOW (ref 83.0–108.0)

## 2020-06-14 LAB — CBC
HCT: 42.7 % (ref 36.0–46.0)
Hemoglobin: 14.5 g/dL (ref 12.0–15.0)
MCH: 30.4 pg (ref 26.0–34.0)
MCHC: 34 g/dL (ref 30.0–36.0)
MCV: 89.5 fL (ref 80.0–100.0)
Platelets: 294 10*3/uL (ref 150–400)
RBC: 4.77 MIL/uL (ref 3.87–5.11)
RDW: 13.7 % (ref 11.5–15.5)
WBC: 9.7 10*3/uL (ref 4.0–10.5)
nRBC: 0 % (ref 0.0–0.2)

## 2020-06-14 LAB — BASIC METABOLIC PANEL
Anion gap: 12 (ref 5–15)
BUN: 15 mg/dL (ref 8–23)
CO2: 22 mmol/L (ref 22–32)
Calcium: 8.9 mg/dL (ref 8.9–10.3)
Chloride: 97 mmol/L — ABNORMAL LOW (ref 98–111)
Creatinine, Ser: 0.58 mg/dL (ref 0.44–1.00)
GFR, Estimated: >60 mL/min (ref >60)
Glucose, Bld: 246 mg/dL — ABNORMAL HIGH (ref 70–99)
Potassium: 3.8 mmol/L (ref 3.5–5.1)
Sodium: 131 mmol/L — ABNORMAL LOW (ref 135–145)

## 2020-06-14 LAB — POCT I-STAT EG7
Acid-Base Excess: 1 mmol/L (ref 0.0–2.0)
Bicarbonate: 26.4 mmol/L (ref 20.0–28.0)
Calcium, Ion: 1.24 mmol/L (ref 1.15–1.40)
HCT: 44 % (ref 36.0–46.0)
Hemoglobin: 15 g/dL (ref 12.0–15.0)
O2 Saturation: 77 %
Potassium: 3.6 mmol/L (ref 3.5–5.1)
Sodium: 137 mmol/L (ref 135–145)
TCO2: 28 mmol/L (ref 22–32)
pCO2, Ven: 46 mmHg (ref 44.0–60.0)
pH, Ven: 7.367 (ref 7.250–7.430)
pO2, Ven: 43 mmHg (ref 32.0–45.0)

## 2020-06-14 LAB — GLUCOSE, CAPILLARY
Glucose-Capillary: 194 mg/dL — ABNORMAL HIGH (ref 70–99)
Glucose-Capillary: 243 mg/dL — ABNORMAL HIGH (ref 70–99)
Glucose-Capillary: 150 mg/dL — ABNORMAL HIGH (ref 70–99)
Glucose-Capillary: 154 mg/dL — ABNORMAL HIGH (ref 70–99)
Glucose-Capillary: 227 mg/dL — ABNORMAL HIGH (ref 70–99)

## 2020-06-14 LAB — LIPID PANEL
Cholesterol: 192 mg/dL (ref 0–200)
HDL: 57 mg/dL (ref >40)
LDL Cholesterol: 107 mg/dL — ABNORMAL HIGH (ref 0–99)
Total CHOL/HDL Ratio: 3.4 RATIO
Triglycerides: 140 mg/dL (ref <150)
VLDL: 28 mg/dL (ref 0–40)

## 2020-06-14 LAB — TROPONIN I (HIGH SENSITIVITY): Troponin I (High Sensitivity): 33 ng/L — ABNORMAL HIGH (ref <18)

## 2020-06-14 SURGERY — RIGHT/LEFT HEART CATH AND CORONARY ANGIOGRAPHY
Anesthesia: LOCAL

## 2020-06-14 MED ORDER — INSULIN ASPART 100 UNIT/ML IJ SOLN: 0.0000 [IU] | Freq: Three times a day (TID) | INTRAMUSCULAR | Status: DC

## 2020-06-14 MED ORDER — AMITRIPTYLINE HCL 25 MG PO TABS
50.0000 mg | ORAL_TABLET | Freq: Every day | ORAL | Status: DC
  Administered 2020-06-14: 50 mg via ORAL
  Filled 2020-06-14: qty 2

## 2020-06-14 MED ORDER — HEPARIN (PORCINE) IN NACL 1000-0.9 UT/500ML-% IV SOLN
INTRAVENOUS | Status: DC | PRN
  Administered 2020-06-14 (×2): 500 mL

## 2020-06-14 MED ORDER — MIDAZOLAM HCL 2 MG/2ML IJ SOLN
INTRAMUSCULAR | Status: AC
  Filled 2020-06-14: qty 2

## 2020-06-14 MED ORDER — LABETALOL HCL 5 MG/ML IV SOLN: 10.0000 mg | INTRAVENOUS | Status: AC | PRN

## 2020-06-14 MED ORDER — HEPARIN SODIUM (PORCINE) 1000 UNIT/ML IJ SOLN
INTRAMUSCULAR | Status: AC
  Filled 2020-06-14: qty 1

## 2020-06-14 MED ORDER — DOXEPIN HCL 10 MG PO CAPS
10.0000 mg | ORAL_CAPSULE | Freq: Every evening | ORAL | Status: DC | PRN
  Administered 2020-06-14: 10 mg via ORAL
  Filled 2020-06-14 (×2): qty 1

## 2020-06-14 MED ORDER — INSULIN ASPART 100 UNIT/ML IJ SOLN
0.0000 [IU] | Freq: Three times a day (TID) | INTRAMUSCULAR | Status: DC
Start: 2020-06-14 — End: 2020-06-15
  Administered 2020-06-14: 2 [IU] via SUBCUTANEOUS
  Administered 2020-06-15: 8 [IU] via SUBCUTANEOUS

## 2020-06-14 MED ORDER — SODIUM CHLORIDE 0.9% FLUSH: 3.0000 mL | INTRAVENOUS | Status: DC | PRN

## 2020-06-14 MED ORDER — LEVOTHYROXINE SODIUM 50 MCG PO TABS
50.0000 ug | ORAL_TABLET | Freq: Every day | ORAL | Status: DC
  Administered 2020-06-15: 50 ug via ORAL
  Filled 2020-06-14: qty 1

## 2020-06-14 MED ORDER — AMLODIPINE BESYLATE 10 MG PO TABS
10.0000 mg | ORAL_TABLET | Freq: Every day | ORAL | Status: DC
  Administered 2020-06-14 – 2020-06-15 (×2): 10 mg via ORAL
  Filled 2020-06-14 (×2): qty 1

## 2020-06-14 MED ORDER — SODIUM CHLORIDE 0.9 % IV SOLN: 250.0000 mL | INTRAVENOUS | Status: DC | PRN

## 2020-06-14 MED ORDER — MIDAZOLAM HCL 2 MG/2ML IJ SOLN
INTRAMUSCULAR | Status: DC | PRN
  Administered 2020-06-14 (×2): 1 mg via INTRAVENOUS

## 2020-06-14 MED ORDER — SODIUM CHLORIDE 0.9 % IV SOLN: INTRAVENOUS | Status: AC

## 2020-06-14 MED ORDER — IOHEXOL 350 MG/ML SOLN
INTRAVENOUS | Status: DC | PRN
  Administered 2020-06-14: 50 mL

## 2020-06-14 MED ORDER — SODIUM CHLORIDE 0.9% FLUSH
3.0000 mL | Freq: Two times a day (BID) | INTRAVENOUS | Status: DC
  Administered 2020-06-15: 3 mL via INTRAVENOUS

## 2020-06-14 MED ORDER — FENTANYL CITRATE (PF) 100 MCG/2ML IJ SOLN
INTRAMUSCULAR | Status: DC | PRN
  Administered 2020-06-14 (×2): 25 ug via INTRAVENOUS

## 2020-06-14 MED ORDER — HEPARIN (PORCINE) IN NACL 1000-0.9 UT/500ML-% IV SOLN
INTRAVENOUS | Status: AC
  Filled 2020-06-14: qty 500

## 2020-06-14 MED ORDER — FENTANYL CITRATE (PF) 100 MCG/2ML IJ SOLN
INTRAMUSCULAR | Status: AC
  Filled 2020-06-14: qty 2

## 2020-06-14 MED ORDER — FENTANYL CITRATE (PF) 100 MCG/2ML IJ SOLN
INTRAMUSCULAR | Status: DC | PRN
  Administered 2020-06-14: 25 ug via INTRAVENOUS

## 2020-06-14 MED ORDER — INSULIN ASPART 100 UNIT/ML IJ SOLN
0.0000 [IU] | Freq: Three times a day (TID) | INTRAMUSCULAR | Status: DC
Start: 2020-06-14 — End: 2020-06-14
  Administered 2020-06-14: 5 [IU] via SUBCUTANEOUS

## 2020-06-14 MED ORDER — LIDOCAINE HCL (PF) 1 % IJ SOLN
INTRAMUSCULAR | Status: DC | PRN
  Administered 2020-06-14 (×2): 2 mL

## 2020-06-14 MED ORDER — HYDRALAZINE HCL 20 MG/ML IJ SOLN: 10.0000 mg | INTRAMUSCULAR | Status: AC | PRN

## 2020-06-14 SURGICAL SUPPLY — 16 items
CATH INFINITI 5 FR AL2 (CATHETERS) ×1 IMPLANT
CATH INFINITI 5FR AL1 (CATHETERS) ×1 IMPLANT
CATH INFINITI 5FR MULTPACK ANG (CATHETERS) ×1 IMPLANT
CATH SWAN GANZ 7F STRAIGHT (CATHETERS) ×1 IMPLANT
CLOSURE MYNX CONTROL 5F (Vascular Products) ×1 IMPLANT
GLIDESHEATH SLEND SS 6F .021 (SHEATH) ×1 IMPLANT
GLIDESHEATH SLENDER 7FR .021G (SHEATH) ×1 IMPLANT
KIT HEART LEFT (KITS) ×2 IMPLANT
PACK CARDIAC CATHETERIZATION (CUSTOM PROCEDURE TRAY) ×2 IMPLANT
SHEATH PINNACLE 5F 10CM (SHEATH) ×1 IMPLANT
SHEATH PROBE COVER 6X72 (BAG) ×1 IMPLANT
TRANSDUCER W/STOPCOCK (MISCELLANEOUS) ×2 IMPLANT
TUBING CIL FLEX 10 FLL-RA (TUBING) ×2 IMPLANT
WIRE EMERALD 3MM-J .025X260CM (WIRE) ×1 IMPLANT
WIRE EMERALD 3MM-J .035X150CM (WIRE) ×1 IMPLANT
WIRE EMERALD ST .035X150CM (WIRE) ×1 IMPLANT

## 2020-06-14 NOTE — Progress Notes (Signed)
Mobility Specialist - Progress Note   06/14/20 1610  Mobility  Activity Contraindicated/medical hold   Pt preparing to transport to cath lab, will f/u as able.   Kristina Montgomery Mobility Specialist Mobility Specialist Phone: [phone]

## 2020-06-14 NOTE — H&P (View-Only) (Signed)
Progress Note  Patient Name: Kristina Montgomery Date of Encounter: 06/14/2020  [facility] Cardiologist: Parke Poisson, MD   Subjective   No issues overnight. Planned for cath today.   Inpatient Medications    Scheduled Meds: . aspirin EC  81 mg Oral Daily  . doxepin  300 mg Oral QHS  . heparin  5,000 Units Subcutaneous Q8H  . insulin aspart  0-15 Units Subcutaneous TID WC  . levothyroxine  25 mcg Oral Q0600  . metoprolol tartrate  25 mg Oral BID  . oxyCODONE  30 mg Oral Q12H  . pioglitazone  45 mg Oral Daily  . rosuvastatin  40 mg Oral Daily  . sodium chloride flush  3 mL Intravenous Q12H  . venlafaxine XR  75 mg Oral Q breakfast   Continuous Infusions: . sodium chloride    . sodium chloride    . sodium chloride 1 mL/kg/hr (06/14/20 0648)   PRN Meds: sodium chloride, acetaminophen, nitroGLYCERIN, ondansetron (ZOFRAN) IV, ondansetron, sodium chloride flush   Vital Signs    Vitals:   06/13/20 2323 06/13/20 2326 06/14/20 0502 06/14/20 0623  BP: (!) 168/76 (!) 162/87  (!) 172/75  Pulse: 86 92  88  Resp:      Temp:    98.2 F (36.8 C)  TempSrc:    Oral  SpO2: 95% 94%  96%  Weight:   79.3 kg   Height:        Intake/Output Summary (Last 24 hours) at 06/14/2020 0824 Last data filed at 06/14/2020 0700 Gross per 24 hour  Intake 176.52 ml  Output 1100 ml  Net -923.48 ml   Last 3 Weights 06/14/2020 06/13/2020 06/13/2020  Weight (lbs) 174 lb 12.8 oz 181 lb 181 lb 9.6 oz  Weight (kg) 79.289 kg 82.101 kg 82.373 kg      Telemetry    SR--> 80s - Personally Reviewed  ECG    SR, RBBB - Personally Reviewed  Physical Exam   GEN: No acute distress.   Neck: No JVD Cardiac: RRR, + 3/6 systolic murmur, no rubs, or gallops.  Respiratory: Expiratory wheezing bilaterally GI: Soft, nontender, non-distended  MS: No edema; No deformity. Neuro:  Nonfocal  Psych: Normal affect   Labs    High Sensitivity Troponin:   Recent Labs  Lab 06/13/20 1320 06/13/20 1640  06/13/20 2130 06/13/20 2250  TROPONINIHS 34* 38* 38* 33*      Chemistry Recent Labs  Lab 06/13/20 1320 06/13/20 2130 06/14/20 0057  NA 129* 134* 131*  K 4.7 4.1 3.8  CL 93* 98 97*  CO2 29 25 22   GLUCOSE 306* 177* 246*  BUN 19 16 15   CREATININE 0.94 0.67 0.58  CALCIUM 9.5 9.7 8.9  GFRNONAA >60 >60 >60  ANIONGAP 7 11 12      Hematology Recent Labs  Lab 06/13/20 1320 06/13/20 2130 06/14/20 0057  WBC 8.9 9.3 9.7  RBC 4.79 5.06 4.77  HGB 14.6 15.6* 14.5  HCT 44.4 46.7* 42.7  MCV 92.7 92.3 89.5  MCH 30.5 30.8 30.4  MCHC 32.9 33.4 34.0  RDW 13.7 13.8 13.7  PLT 318 318 294    BNPNo results for input(s): BNP, PROBNP in the last 168 hours.   DDimer No results for input(s): DDIMER in the last 168 hours.   Radiology    DG Chest 2 View  Result Date: 06/13/2020 CLINICAL DATA:  Chest pain. EXAM: CHEST - 2 VIEW COMPARISON:  Chest x-ray 06/01/2020. FINDINGS: Mediastinum hilar structures normal. Cardiomegaly. No  pulmonary venous congestion. Low lung volumes with mild bibasilar atelectasis/infiltrates. No pleural effusion or pneumothorax. Degenerative change thoracic spine. IMPRESSION: 1.  Cardiomegaly.  No pulmonary venous congestion. 2.  Low lung volumes with mild bibasilar atelectasis/infiltrates. Electronically Signed   By: Maisie Fus  Register   On: 06/13/2020 14:01    Cardiac Studies   Echo: 05/03/20  IMPRESSIONS    1. Severe aortic valve stenosis.. The aortic valve is abnormal. There is  severe calcifcation of the aortic valve. Aortic valve regurgitation is  trivial. Severe aortic valve stenosis. Aortic valve area, by VTI measures  1.07 cm. Aortic valve mean  gradient measures 40.0 mmHg. Aortic valve Vmax measures 4.01 m/s.  2. Left ventricular ejection fraction, by estimation, is 60 to 65%. The  left ventricle has normal function. The left ventricle has no regional  wall motion abnormalities. There is mild left ventricular hypertrophy.  Indeterminate diastolic  filling due to  E-A fusion.  3. Right ventricular systolic function is normal. The right ventricular  size is normal. Tricuspid regurgitation signal is inadequate for assessing  PA pressure.  4. Left atrial size was moderately dilated.  5. The mitral valve is degenerative. Trivial mitral valve regurgitation.  No evidence of mitral stenosis.  6. The inferior vena cava is normal in size with greater than 50%  respiratory variability, suggesting right atrial pressure of 3 mmHg.   Patient Profile     65 y.o. female with PMH of COPD, CAD with NSTEMI '16, DM, severe AS, HTN, HLD, depression, tobacco use and chronic back pain who was seen in the office yesterday for follow up after a recent CVA and reported chest pain. Sent to the ED for further evaluation.   Assessment & Plan    1. Chest pain concerning for angina: negative hsTn, EKG showed sinus rhythm with RBBB (old) -- she has a history of known CAD (cath in 2016) and risk factors including HTN, HLD, uncontrolled DM, smoking, obesity, and family history of premature heart disease -- undergoing work up for TAVR which was delayed given presentation of recent Stroke.  -- planned for cath today given ongoing symptoms & need for definitive invasive ischemic evaluation given upcoming TAVR. -- continue ASA, and plavix  2. Recent CVA: suspect embolic event -- no arrhythmia on telemetry, but dilated left atrium on echo  -- pending TEE and loop recorder, this was deferred in the setting of ongoing chest pain -- never picked up her plavix Rx after recent DC - need to start on d/c  3. Hypertension: -- continue 10 mg amlodipinedaily  Prior to arrival, her pressures were not high, but currently are elevated I think a lot of this may be related to anxiety.  We will need to reassess BP medications prior to discharge.  4. Hyperlipidemia: 06/01/2020: Cholesterol 207; HDL 46; LDL Cholesterol 114; Triglycerides 235; VLDL 47 -- Crestor 40mg   daily  5. Severe aortic stenosis: confirmed on echo -- has been evaluated by TAVR team and planned for outpatient cath, but Dx with CVA day of planned cath. -- further work up pending cath today  6. Smoker/COPD: encouraged cessation  7. Diabetes mellitus type 2: A1c was 11.1% -- currently on januvia and "an injectable" - she couldn't remember the name (possibly ozempic? Listed on med list -- says she was taking Actos but with potential CAD will DC -- add SSI -- diabetes coordinator consult   For questions or updates, please contact [facility] Please consult [email or web address] for contact info under  Signed, Laverda Page, NP  06/14/2020, 8:24 AM      ATTENDING ATTESTATION  I have seen, examined and evaluated the patient this PM along with Laverda Page, NP-C.  After reviewing all the available data and chart, we discussed the patients laboratory, study & physical findings as well as symptoms in detail. I agree with her findings, examination as well as impression recommendations as per our discussion.    Attending adjustments noted in italics.    Patient admitted overnight with plans for cardiac catheterization today.  Blood pressure somewhat elevated which is the only issue that is currently active besides her plan  for cath today.  We can readdress her blood pressure medications upon discharge.  Otherwise we will plan.  Await results of cardiac cath.   Bryan Lemma, M.D., M.S. Interventional Cardiologist   Pager # [phone] Phone # [phone] [address] [place], [place] [address]

## 2020-06-14 NOTE — Interval H&P Note (Signed)
History and Physical Interval Note:  06/14/2020 4:32 PM  Kristina Montgomery  has presented today for surgery, with the diagnosis of unstable angina.  The various methods of treatment have been discussed with the patient and family. After consideration of risks, benefits and other options for treatment, the patient has consented to  Procedure(s): RIGHT/LEFT HEART CATH AND CORONARY ANGIOGRAPHY (N/A) as a surgical intervention.  The patient's history has been reviewed, patient examined, no change in status, stable for surgery.  I have reviewed the patient's chart and labs.  Questions were answered to the patient's satisfaction.    Cath Lab Visit (complete for each Cath Lab visit)  Clinical Evaluation Leading to the Procedure:   ACS: Yes.    Non-ACS:    Anginal Classification: CCS III  Anti-ischemic medical therapy: Minimal Therapy (1 class of medications)  Non-Invasive Test Results: No non-invasive testing performed  Prior CABG: No previous CABG        Kristina Montgomery

## 2020-06-14 NOTE — Progress Notes (Addendum)
Progress Note  Patient Name: Kristina Montgomery Date of Encounter: 06/14/2020  [facility] Cardiologist: Parke Poisson, MD   Subjective   No issues overnight. Planned for cath today.   Inpatient Medications    Scheduled Meds: . aspirin EC  81 mg Oral Daily  . doxepin  300 mg Oral QHS  . heparin  5,000 Units Subcutaneous Q8H  . insulin aspart  0-15 Units Subcutaneous TID WC  . levothyroxine  25 mcg Oral Q0600  . metoprolol tartrate  25 mg Oral BID  . oxyCODONE  30 mg Oral Q12H  . pioglitazone  45 mg Oral Daily  . rosuvastatin  40 mg Oral Daily  . sodium chloride flush  3 mL Intravenous Q12H  . venlafaxine XR  75 mg Oral Q breakfast   Continuous Infusions: . sodium chloride    . sodium chloride    . sodium chloride 1 mL/kg/hr (06/14/20 0648)   PRN Meds: sodium chloride, acetaminophen, nitroGLYCERIN, ondansetron (ZOFRAN) IV, ondansetron, sodium chloride flush   Vital Signs    Vitals:   06/13/20 2323 06/13/20 2326 06/14/20 0502 06/14/20 0623  BP: (!) 168/76 (!) 162/87  (!) 172/75  Pulse: 86 92  88  Resp:      Temp:    98.2 F (36.8 C)  TempSrc:    Oral  SpO2: 95% 94%  96%  Weight:   79.3 kg   Height:        Intake/Output Summary (Last 24 hours) at 06/14/2020 0824 Last data filed at 06/14/2020 0700 Gross per 24 hour  Intake 176.52 ml  Output 1100 ml  Net -923.48 ml   Last 3 Weights 06/14/2020 06/13/2020 06/13/2020  Weight (lbs) 174 lb 12.8 oz 181 lb 181 lb 9.6 oz  Weight (kg) 79.289 kg 82.101 kg 82.373 kg      Telemetry    SR--> 80s - Personally Reviewed  ECG    SR, RBBB - Personally Reviewed  Physical Exam   GEN: No acute distress.   Neck: No JVD Cardiac: RRR, + 3/6 systolic murmur, no rubs, or gallops.  Respiratory: Expiratory wheezing bilaterally GI: Soft, nontender, non-distended  MS: No edema; No deformity. Neuro:  Nonfocal  Psych: Normal affect   Labs    High Sensitivity Troponin:   Recent Labs  Lab 06/13/20 1320 06/13/20 1640  06/13/20 2130 06/13/20 2250  TROPONINIHS 34* 38* 38* 33*      Chemistry Recent Labs  Lab 06/13/20 1320 06/13/20 2130 06/14/20 0057  NA 129* 134* 131*  K 4.7 4.1 3.8  CL 93* 98 97*  CO2 29 25 22   GLUCOSE 306* 177* 246*  BUN 19 16 15   CREATININE 0.94 0.67 0.58  CALCIUM 9.5 9.7 8.9  GFRNONAA >60 >60 >60  ANIONGAP 7 11 12      Hematology Recent Labs  Lab 06/13/20 1320 06/13/20 2130 06/14/20 0057  WBC 8.9 9.3 9.7  RBC 4.79 5.06 4.77  HGB 14.6 15.6* 14.5  HCT 44.4 46.7* 42.7  MCV 92.7 92.3 89.5  MCH 30.5 30.8 30.4  MCHC 32.9 33.4 34.0  RDW 13.7 13.8 13.7  PLT 318 318 294    BNPNo results for input(s): BNP, PROBNP in the last 168 hours.   DDimer No results for input(s): DDIMER in the last 168 hours.   Radiology    DG Chest 2 View  Result Date: 06/13/2020 CLINICAL DATA:  Chest pain. EXAM: CHEST - 2 VIEW COMPARISON:  Chest x-ray 06/01/2020. FINDINGS: Mediastinum hilar structures normal. Cardiomegaly. No  pulmonary venous congestion. Low lung volumes with mild bibasilar atelectasis/infiltrates. No pleural effusion or pneumothorax. Degenerative change thoracic spine. IMPRESSION: 1.  Cardiomegaly.  No pulmonary venous congestion. 2.  Low lung volumes with mild bibasilar atelectasis/infiltrates. Electronically Signed   By: Maisie Fus  Register   On: 06/13/2020 14:01    Cardiac Studies   Echo: 05/03/20  IMPRESSIONS    1. Severe aortic valve stenosis.. The aortic valve is abnormal. There is  severe calcifcation of the aortic valve. Aortic valve regurgitation is  trivial. Severe aortic valve stenosis. Aortic valve area, by VTI measures  1.07 cm. Aortic valve mean  gradient measures 40.0 mmHg. Aortic valve Vmax measures 4.01 m/s.  2. Left ventricular ejection fraction, by estimation, is 60 to 65%. The  left ventricle has normal function. The left ventricle has no regional  wall motion abnormalities. There is mild left ventricular hypertrophy.  Indeterminate diastolic  filling due to  E-A fusion.  3. Right ventricular systolic function is normal. The right ventricular  size is normal. Tricuspid regurgitation signal is inadequate for assessing  PA pressure.  4. Left atrial size was moderately dilated.  5. The mitral valve is degenerative. Trivial mitral valve regurgitation.  No evidence of mitral stenosis.  6. The inferior vena cava is normal in size with greater than 50%  respiratory variability, suggesting right atrial pressure of 3 mmHg.   Patient Profile     65 y.o. female with PMH of COPD, CAD with NSTEMI '16, DM, severe AS, HTN, HLD, depression, tobacco use and chronic back pain who was seen in the office yesterday for follow up after a recent CVA and reported chest pain. Sent to the ED for further evaluation.   Assessment & Plan    1. Chest pain concerning for angina: negative hsTn, EKG showed sinus rhythm with RBBB (old) -- she has a history of known CAD (cath in 2016) and risk factors including HTN, HLD, uncontrolled DM, smoking, obesity, and family history of premature heart disease -- undergoing work up for TAVR which was delayed given presentation of recent Stroke.  -- planned for cath today given ongoing symptoms & need for definitive invasive ischemic evaluation given upcoming TAVR. -- continue ASA, and plavix  2. Recent CVA: suspect embolic event -- no arrhythmia on telemetry, but dilated left atrium on echo  -- pending TEE and loop recorder, this was deferred in the setting of ongoing chest pain -- never picked up her plavix Rx after recent DC - need to start on d/c  3. Hypertension: -- continue 10 mg amlodipinedaily  Prior to arrival, her pressures were not high, but currently are elevated I think a lot of this may be related to anxiety.  We will need to reassess BP medications prior to discharge.  4. Hyperlipidemia: 06/01/2020: Cholesterol 207; HDL 46; LDL Cholesterol 114; Triglycerides 235; VLDL 47 -- Crestor 40mg   daily  5. Severe aortic stenosis: confirmed on echo -- has been evaluated by TAVR team and planned for outpatient cath, but Dx with CVA day of planned cath. -- further work up pending cath today  6. Smoker/COPD: encouraged cessation  7. Diabetes mellitus type 2: A1c was 11.1% -- currently on januvia and "an injectable" - she couldn't remember the name (possibly ozempic? Listed on med list -- says she was taking Actos but with potential CAD will DC -- add SSI -- diabetes coordinator consult   For questions or updates, please contact [facility] Please consult [email or web address] for contact info under  Signed, Laverda Page, NP  06/14/2020, 8:24 AM      ATTENDING ATTESTATION  I have seen, examined and evaluated the patient this PM along with Laverda Page, NP-C.  After reviewing all the available data and chart, we discussed the patients laboratory, study & physical findings as well as symptoms in detail. I agree with her findings, examination as well as impression recommendations as per our discussion.    Attending adjustments noted in italics.    Patient admitted overnight with plans for cardiac catheterization today.  Blood pressure somewhat elevated which is the only issue that is currently active besides her plan  for cath today.  We can readdress her blood pressure medications upon discharge.  Otherwise we will plan.  Await results of cardiac cath.   Bryan Lemma, M.D., M.S. Interventional Cardiologist   Pager # [phone] Phone # [phone] [address] [place], [place] [address]

## 2020-06-14 NOTE — Progress Notes (Signed)
Pt removed from bedrest. Right groin is a level 0. AMbulated to bathroom without incident

## 2020-06-14 NOTE — Progress Notes (Signed)
Inpatient Diabetes Program Recommendations  AACE/ADA: New Consensus Statement on Inpatient Glycemic Control (2015)  Target Ranges:  Prepandial:   less than 140 mg/dL      Peak postprandial:   less than 180 mg/dL (1-2 hours)      Critically ill patients:  140 - 180 mg/dL   Lab Results  Component Value Date   GLUCAP 150 (H) 06/14/2020   HGBA1C 11.1 (H) 06/01/2020    Review of Glycemic Control  Diabetes history: type 2 Outpatient Diabetes medications: Actos 45 mg daily, Januvia 50 mg daily, Amaryl 4 mg daily Current orders for Inpatient glycemic control: Novolog MODERATE correction scale TID  Inpatient Diabetes Program Recommendations:   Received diabetes coordinator consult. Spoke with patient on last admission on 5/13 in regards to her A1C of 11.1%.   Will continue to follow while in the hospital.  Smith Mince RN BSN CDE Diabetes Coordinator Pager: [phone]  8am-5pm

## 2020-06-15 ENCOUNTER — Encounter (HOSPITAL_COMMUNITY): Payer: Self-pay | Admitting: Cardiovascular Disease

## 2020-06-15 LAB — CBC
HCT: 49.1 % — ABNORMAL HIGH (ref 36.0–46.0)
Hemoglobin: 16.4 g/dL — ABNORMAL HIGH (ref 12.0–15.0)
MCH: 30.4 pg (ref 26.0–34.0)
MCHC: 33.4 g/dL (ref 30.0–36.0)
MCV: 90.9 fL (ref 80.0–100.0)
Platelets: 332 10*3/uL (ref 150–400)
RBC: 5.4 MIL/uL — ABNORMAL HIGH (ref 3.87–5.11)
RDW: 13.7 % (ref 11.5–15.5)
WBC: 11.3 10*3/uL — ABNORMAL HIGH (ref 4.0–10.5)
nRBC: 0 % (ref 0.0–0.2)

## 2020-06-15 LAB — BASIC METABOLIC PANEL
Anion gap: 9 (ref 5–15)
BUN: 14 mg/dL (ref 8–23)
CO2: 28 mmol/L (ref 22–32)
Calcium: 9.6 mg/dL (ref 8.9–10.3)
Chloride: 98 mmol/L (ref 98–111)
Creatinine, Ser: 0.62 mg/dL (ref 0.44–1.00)
GFR, Estimated: >60 mL/min (ref >60)
Glucose, Bld: 207 mg/dL — ABNORMAL HIGH (ref 70–99)
Potassium: 4.3 mmol/L (ref 3.5–5.1)
Sodium: 135 mmol/L (ref 135–145)

## 2020-06-15 LAB — GLUCOSE, CAPILLARY: Glucose-Capillary: 278 mg/dL — ABNORMAL HIGH (ref 70–99)

## 2020-06-15 MED ORDER — METOPROLOL TARTRATE 25 MG PO TABS: 25.0000 mg | ORAL_TABLET | Freq: Two times a day (BID) | ORAL | 1 refills | Status: DC

## 2020-06-15 MED FILL — Heparin Sodium (Porcine) Inj 1000 Unit/ML: INTRAMUSCULAR | Qty: 10 | Status: AC

## 2020-06-15 NOTE — Discharge Summary (Signed)
Discharge Summary    Patient ID: Kristina Montgomery MRN: [id number]; DOB: 14-May-1955  Admit date: 06/13/2020 Discharge date: 06/15/2020  PCP:  Galvin Proffer, MD   [facility] Providers Cardiologist:  Parke Poisson, MD   {   Discharge Diagnoses    Active Problems:   Unstable angina [facility])   Chest pain   Severe aortic stenosis    Diagnostic Studies/Procedures    Cath: 06/14/20   Prox RCA lesion is 30% stenosed.  Mid RCA lesion is 30% stenosed.  2nd Mrg lesion is 30% stenosed.  Ramus lesion is 30% stenosed.   1. Mild non-obstructive CAD 2. Severe aortic stenosis by echo (by cath mean gradient 27.7 mmHg, peak to peak gradient 28 mmHg)  Recommendations: Continue workup for TAVR. I would still like to delay TAVR for at least 8 weeks post CVA if possible. We can arrange her CT scans next.   _____________   History of Present Illness     Kristina Montgomery is a 65 y.o. female with a hx of COPD,CAD with NSTEMI 2016,uncontrolled DM, severe AS, HTN, HLD, depression, current smoker, and chronic back pain.Heart cath in 2016 without intervention [facility]).She was referred to Dr. Jacques Navy in April 2022 following an echocardiogram that showed severe aortic valve stenosis. She was then referred to the structural heart team and saw Dr. Clifton James 05/23/20 who arranged for TAVR workup, including right and left heart cath. She presented to short stay 06/01/20 for schedule heart cath; however, staff noted patient was very sedated. Her son confirmed she took home oxycodone that morning. She appears neurologically intact; however, cath was canceled due to altered mental status. Head CT showed acute stroke in the mid left occipital lobe near parieto-occipital junction. Neurology consulted and suspected acute infarct. MRI brain showed 2 acute/early subacute infarcts in the left frontoparietal subcortical white matter. Carotid dopplers with mild nonobstructive disease bilaterally. Echo showed EF 60%,  grade 1 DD, moderately dilated LA. She was discharged on ASA and plavix x 21 days, then ASA alone. She was started on 80 mg lipitor. A1c was 11.1%. UDS positive for THC. No arrhythmia noted on telemetry, per medicine discharge summary. Presented to the office for follow up and reported ongoing chest pain. Was sent to the ED for further management and admission.     Hospital Course     1. Chest pain: negative hsTn, EKG showed sinus rhythm with RBBB (old). She has a history of known CAD (cath in 2016) and risk factors including HTN, HLD, uncontrolled DM, smoking, obesity, and family history of premature heart disease. Underwent cardiac cath noted above for TAVR work up, with mild nonobstructive CAD. Recommended for medical therapy -- continue ASA, statin, plavix, BB  2. Recent CVA: suspected embolic event -- no arrhythmia on telemetry, but dilated left atrium on echo  -- pending TEE and loop recorder, this was deferred in the setting of ongoing chest pain, will need to be completed as an outpatient -- never picked up her plavix Rx after recent DC, instructed to pick up at discharge  3. Hypertension: continue 10 mg amlodipinedaily, metoprolol 25mg  BID -- stable BP at discharge  4. Hyperlipidemia: 06/01/2020: Cholesterol 207; HDL 46; LDL Cholesterol 114; Triglycerides 235; VLDL 47 -- continue Crestor 40mg  daily  5. Severe aortic stenosis: confirmed on echo -- has been evaluated by TAVR team, cath noted above. Structural team will follow up in the outpatient setting  6. Smoker/COPD: encouraged cessation  7. Diabetes mellitus type 2: A1c was  11.1% -- currently on januvia and glimepiride  -- says she was taking Actos but given cardiac disease will stop -- seen by diabetes coordinator  8. Hypothyroidism: TSH 22 -- reports PCP recently increased synthroid to daily -- instructed to follow up with PCP regarding further management  Did the patient have an acute coronary syndrome  (MI, NSTEMI, STEMI, etc) this admission?:  No                               Did the patient have a percutaneous coronary intervention (stent / angioplasty)?:  No.       _____________  Discharge Vitals Blood pressure 132/80, pulse 98, temperature 98.6 F (37 C), temperature source Oral, resp. rate 18, height 5\' 5"  (1.651 m), weight 78.9 kg, SpO2 93 %.  Filed Weights   06/13/20 1310 06/14/20 0502 06/15/20 0245  Weight: 82.1 kg 79.3 kg 78.9 kg    Labs & Radiologic Studies    CBC Recent Labs    06/14/20 0057 06/14/20 1714 06/14/20 1717 06/15/20 0200  WBC 9.7  --   --  11.3*  HGB 14.5   < > 15.0 16.4*  HCT 42.7   < > 44.0 49.1*  MCV 89.5  --   --  90.9  PLT 294  --   --  332   < > = values in this interval not displayed.   Basic Metabolic Panel Recent Labs    29/52/84 2130 06/14/20 0057 06/14/20 1714 06/14/20 1717 06/15/20 0200  NA 134* 131*   < > 137 135  K 4.1 3.8   < > 3.6 4.3  CL 98 97*  --   --  98  CO2 25 22  --   --  28  GLUCOSE 177* 246*  --   --  207*  BUN 16 15  --   --  14  CREATININE 0.67 0.58  --   --  0.62  CALCIUM 9.7 8.9  --   --  9.6  MG 2.2  --   --   --   --    < > = values in this interval not displayed.   Liver Function Tests No results for input(s): AST, ALT, ALKPHOS, BILITOT, PROT, ALBUMIN in the last 72 hours. No results for input(s): LIPASE, AMYLASE in the last 72 hours. High Sensitivity Troponin:   Recent Labs  Lab 06/13/20 1320 06/13/20 1640 06/13/20 2130 06/13/20 2250  TROPONINIHS 34* 38* 38* 33*    BNP Invalid input(s): POCBNP D-Dimer No results for input(s): DDIMER in the last 72 hours. Hemoglobin A1C No results for input(s): HGBA1C in the last 72 hours. Fasting Lipid Panel Recent Labs    06/14/20 0057  CHOL 192  HDL 57  LDLCALC 107*  TRIG 140  CHOLHDL 3.4   Thyroid Function Tests Recent Labs    06/13/20 2130  TSH 22.202*   _____________  DG Chest 2 View  Result Date: 06/13/2020 CLINICAL DATA:  Chest pain.  EXAM: CHEST - 2 VIEW COMPARISON:  Chest x-ray 06/01/2020. FINDINGS: Mediastinum hilar structures normal. Cardiomegaly. No pulmonary venous congestion. Low lung volumes with mild bibasilar atelectasis/infiltrates. No pleural effusion or pneumothorax. Degenerative change thoracic spine. IMPRESSION: 1.  Cardiomegaly.  No pulmonary venous congestion. 2.  Low lung volumes with mild bibasilar atelectasis/infiltrates. Electronically Signed   By: Maisie Fus  Register   On: 06/13/2020 14:01   CT HEAD WO CONTRAST  Result Date: 06/01/2020  CLINICAL DATA:  Altered mental status EXAM: CT HEAD WITHOUT CONTRAST TECHNIQUE: Contiguous axial images were obtained from the base of the skull through the vertex without intravenous contrast. COMPARISON:  April 28, 2014. FINDINGS: Brain: Ventricles and sulci are normal in size and configuration. No well-defined mass. No hemorrhage, extra-axial fluid collection, midline shift. There is decreased attenuation in the superior midportion left occipital lobe near the left parieto-occipital junction consistent with acute infarct. Elsewhere brain parenchyma appears unremarkable. Vascular: No hyperdense vessel. Foci of calcification noted in the carotid siphon regions. Skull: Bony calvarium appears intact. Sinuses/Orbits: Paranasal sinuses are clear. There is leftward deviation of the nasal septum. Orbits appear symmetric bilaterally. Other: Mastoid air cells are clear. IMPRESSION: Decreased attenuation in the superior mid left occipital lobe near the parieto-occipital junction, likely due to acute infarct. No similar changes elsewhere. No evident mass on noncontrast study. No hemorrhage. Foci of arterial vascular calcification noted. Deviated nasal septum. These results will be called to the ordering clinician or representative by the Radiologist Assistant, and communication documented in the PACS or [employer]. Electronically Signed   By: Bretta Bang III M.D.   On: 06/01/2020 11:12    MR ANGIO HEAD WO CONTRAST  Result Date: 06/02/2020 CLINICAL DATA:  Stroke, follow-up. EXAM: MRI HEAD WITHOUT CONTRAST MRA HEAD WITHOUT CONTRAST TECHNIQUE: Multiplanar, multi-echo pulse sequences of the brain and surrounding structures were acquired without intravenous contrast. Angiographic images of the Circle of Willis were acquired using MRA technique without intravenous contrast. COMPARISON: No pertinent prior exam. COMPARISON:  Prior CT head 06/01/2020.  Prior CT head 11/06/2004. FINDINGS: MRI HEAD FINDINGS Brain: Mild intermittent motion degradation. Cerebral volume is normal for age. Cortical/subcortical infarct within the left parietal lobe measuring 3.0 x 2.6 x 3.3 cm. Diffusion-weighted hyperintensity is present along the periphery of the infarct. However, there is predominantly corresponding intermediate signal on the ADC map. Additionally, there is developing encephalomalacia at this site and this infarct is likely subacute. Petechial hemorrhage is also present at this site. No significant mass effect. There are two acute/early subacute infarcts within the posterior left frontoparietal subcortical white matter. Background moderate multifocal T2/FLAIR hyperintensity within the cerebral white matter, nonspecific but compatible with chronic small vessel ischemic disease. No evidence of intracranial mass. No extra-axial fluid collection. No midline shift. Vascular: Reported below. Skull and upper cervical spine: No focal marrow lesion. Incompletely assessed cervical spondylosis. Sinuses/Orbits: Visualized orbits show no acute finding. No significant paranasal sinus disease. Other: Trace fluid within the left mastoid air cells. MRA HEAD FINDINGS Anterior circulation: The intracranial internal carotid arteries are patent. Mild atherosclerotic irregularity of both vessels without significant stenosis. The M1 middle cerebral arteries are patent. No M2 proximal branch occlusion is identified. Severe  stenosis within a proximal to mid left M2 MCA branch (series 353, image 6). The anterior cerebral arteries are patent. Moderate stenosis at the origin of the A1 right anterior cerebral artery (series 353, image 15). 2-3 mm anteromedially projecting vascular protrusion arising from the proximal right cavernous ICA compatible with aneurysm (series 453, image 232). 1-2 mm inferiorly projecting vascular protrusion arising from the supraclinoid right ICA, which may reflect an aneurysm or infundibulum (series 453, image 238). 2 mm vascular protrusion projecting anteriorly from the cavernous left ICA, suspicious for aneurysm (series 3, image 68) (series 453, image 199). 1 mm vascular protrusion projecting medially from the cavernous left ICA, which could reflect an additional aneurysm (series 3, image 66). Posterior circulation: The intracranial vertebral arteries are patent. The basilar  artery is patent. The posterior cerebral arteries are patent. Hypoplastic left P1 segment with sizable left posterior communicating artery. Anatomic variants: As described IMPRESSION: MRI brain: 1. 3.0 x 2.6 x 3.3 cm cortical/subcortical left parietal lobe infarct, as described and likely subacute. Petechial hemorrhage is present at this site. No significant mass effect. 2. Two acute/early subacute infarcts measuring up to 12 mm within the left frontoparietal subcortical white matter. 3. Background moderate cerebral white matter chronic small vessel ischemic disease. MRA head: 1. No intracranial large vessel occlusion. 2. Severe stenosis within a proximal to mid left M2 MCA vessel. 3. Mild atherosclerotic irregularity of the intracranial internal carotid arteries bilaterally. 4. 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 5. 1-2 mm inferiorly projecting vascular protrusion arising from the supraclinoid right ICA, which may reflect an aneurysm or infundibulum.  6. 2 mm vascular protrusion projecting anteriorly from the cavernous left ICA, suspicious for aneurysm. 7. 1 mm vascular protrusion projecting medially from the cavernous left ICA, which could reflect an additional small aneurysm. Electronically Signed   By: Jackey Loge DO   On: 06/02/2020 13:44   MR BRAIN WO CONTRAST  Result Date: 06/02/2020 CLINICAL DATA:  Stroke, follow-up. EXAM: MRI HEAD WITHOUT CONTRAST MRA HEAD WITHOUT CONTRAST TECHNIQUE: Multiplanar, multi-echo pulse sequences of the brain and surrounding structures were acquired without intravenous contrast. Angiographic images of the Circle of Willis were acquired using MRA technique without intravenous contrast. COMPARISON: No pertinent prior exam. COMPARISON:  Prior CT head 06/01/2020.  Prior CT head 11/06/2004. FINDINGS: MRI HEAD FINDINGS Brain: Mild intermittent motion degradation. Cerebral volume is normal for age. Cortical/subcortical infarct within the left parietal lobe measuring 3.0 x 2.6 x 3.3 cm. Diffusion-weighted hyperintensity is present along the periphery of the infarct. However, there is predominantly corresponding intermediate signal on the ADC map. Additionally, there is developing encephalomalacia at this site and this infarct is likely subacute. Petechial hemorrhage is also present at this site. No significant mass effect. There are two acute/early subacute infarcts within the posterior left frontoparietal subcortical white matter. Background moderate multifocal T2/FLAIR hyperintensity within the cerebral white matter, nonspecific but compatible with chronic small vessel ischemic disease. No evidence of intracranial mass. No extra-axial fluid collection. No midline shift. Vascular: Reported below. Skull and upper cervical spine: No focal marrow lesion. Incompletely assessed cervical spondylosis. Sinuses/Orbits: Visualized orbits show no acute finding. No significant paranasal sinus disease. Other: Trace fluid within the left  mastoid air cells. MRA HEAD FINDINGS Anterior circulation: The intracranial internal carotid arteries are patent. Mild atherosclerotic irregularity of both vessels without significant stenosis. The M1 middle cerebral arteries are patent. No M2 proximal branch occlusion is identified. Severe stenosis within a proximal to mid left M2 MCA branch (series 353, image 6). The anterior cerebral arteries are patent. Moderate stenosis at the origin of the A1 right anterior cerebral artery (series 353, image 15). 2-3 mm anteromedially projecting vascular protrusion arising from the proximal right cavernous ICA compatible with aneurysm (series 453, image 232). 1-2 mm inferiorly projecting vascular protrusion arising from the supraclinoid right ICA, which may reflect an aneurysm or infundibulum (series 453, image 238). 2 mm vascular protrusion projecting anteriorly from the cavernous left ICA, suspicious for aneurysm (series 3, image 68) (series 453, image 199). 1 mm vascular protrusion projecting medially from the cavernous left ICA, which could reflect an additional aneurysm (series 3, image 66). Posterior circulation: The intracranial vertebral arteries are patent. The basilar artery is patent.  The posterior cerebral arteries are patent. Hypoplastic left P1 segment with sizable left posterior communicating artery. Anatomic variants: As described IMPRESSION: MRI brain: 1. 3.0 x 2.6 x 3.3 cm cortical/subcortical left parietal lobe infarct, as described and likely subacute. Petechial hemorrhage is present at this site. No significant mass effect. 2. Two acute/early subacute infarcts measuring up to 12 mm within the left frontoparietal subcortical white matter. 3. Background moderate cerebral white matter chronic small vessel ischemic disease. MRA head: 1. No intracranial large vessel occlusion. 2. Severe stenosis within a proximal to mid left M2 MCA vessel. 3. Mild atherosclerotic irregularity of the intracranial internal  carotid arteries bilaterally. 4. 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 5. 1-2 mm inferiorly projecting vascular protrusion arising from the supraclinoid right ICA, which may reflect an aneurysm or infundibulum. 6. 2 mm vascular protrusion projecting anteriorly from the cavernous left ICA, suspicious for aneurysm. 7. 1 mm vascular protrusion projecting medially from the cavernous left ICA, which could reflect an additional small aneurysm. Electronically Signed   By: Jackey Loge DO   On: 06/02/2020 13:44   CARDIAC CATHETERIZATION  Result Date: 06/14/2020  Prox RCA lesion is 30% stenosed.  Mid RCA lesion is 30% stenosed.  2nd Mrg lesion is 30% stenosed.  Ramus lesion is 30% stenosed.  1. Mild non-obstructive CAD 2. Severe aortic stenosis by echo (by cath mean gradient 27.7 mmHg, peak to peak gradient 28 mmHg) Recommendations: Continue workup for TAVR. I would still like to delay TAVR for at least 8 weeks post CVA if possible. We can arrange her CT scans next.   DG CHEST PORT 1 VIEW  Result Date: 06/01/2020 CLINICAL DATA:  Lethargic EXAM: PORTABLE CHEST 1 VIEW COMPARISON:  Chest radiograph dated 03/21/2016 and CT chest dated 11/05/2018. FINDINGS: The heart is enlarged. There is mild bibasilar atelectasis/airspace disease. There is no pleural effusion or pneumothorax. Degenerative changes are seen in the spine. IMPRESSION: Mild bibasilar atelectasis/airspace disease.  Cardiomegaly. Electronically Signed   By: Romona Curls M.D.   On: 06/01/2020 15:41   EEG adult  Result Date: 06/02/2020 Charlsie Quest, MD     06/02/2020 12:56 PM Patient Name: GERARDINE PELTZ MRN: [id number] Epilepsy Attending: Charlsie Quest Referring Physician/Provider: Clemon Chambers, PA Date: 06/02/2020 Duration: 23.33 mins Patient history: 65 year old female with acute left parieto-occipital stroke as well as altered mental status.  EEG done for  seizures. Level of alertness: Awake AEDs during EEG study: None Technical aspects: This EEG study was done with scalp electrodes positioned according to the 10-20 International system of electrode placement. Electrical activity was acquired at a sampling rate of 500Hz  and reviewed with a high frequency filter of 70Hz  and a low frequency filter of 1Hz . EEG data were recorded continuously and digitally stored. Description: The posterior dominant rhythm consists of 8 Hz activity of moderate voltage (25-35 uV) seen predominantly in posterior head regions, symmetric and reactive to eye opening and eye closing.  Physiologic photic driving was not seen during photic stimulation.  Hyperventilation was not performed.   IMPRESSION: This study is within normal limits. No seizures or epileptiform discharges were seen throughout the recording. Charlsie Quest   ECHOCARDIOGRAM COMPLETE  Result Date: 06/01/2020    ECHOCARDIOGRAM REPORT   Patient Name:   ROSAN CALBERT Date of Exam: 06/01/2020 Medical Rec #:  [id number]       Height:       65.0 in Accession #:  [id number]      Weight:       184.3 lb Date of Birth:  [birth year]       BSA:          1.911 m Patient Age:    65 years        BP:           114/68 mmHg Patient Gender: F               HR:           85 bpm. Exam Location:  Inpatient Procedure: 2D Echo, Cardiac Doppler and Color Doppler Indications:    CVA  History:        Patient has prior history of Echocardiogram examinations, most                 recent 05/03/2020. CAD, COPD, Signs/Symptoms:Murmur; Risk                 Factors:Dyslipidemia, Diabetes and Hypertension.  Sonographer:    Neomia Dear RDCS Referring Phys: [id number] RONDELL A SMITH  Sonographer Comments: Technically challenging study due to limited acoustic windows and patient is morbidly obese. IMPRESSIONS  1. Left ventricular ejection fraction, by estimation, is 60 to 65%. The left ventricle has normal function. The left ventricle has no regional wall  motion abnormalities. There is moderate left ventricular hypertrophy. Left ventricular diastolic parameters are consistent with Grade I diastolic dysfunction (impaired relaxation).  2. Right ventricular systolic function is normal. The right ventricular size is normal. There is normal pulmonary artery systolic pressure.  3. Left atrial size was moderately dilated.  4. The mitral valve is grossly normal. Mild mitral valve regurgitation.  5. The aortic valve was not well visualized. Aortic valve regurgitation is trivial.  6. The inferior vena cava is normal in size with greater than 50% respiratory variability, suggesting right atrial pressure of 3 mmHg. FINDINGS  Left Ventricle: Left ventricular ejection fraction, by estimation, is 60 to 65%. The left ventricle has normal function. The left ventricle has no regional wall motion abnormalities. The left ventricular internal cavity size was normal in size. There is  moderate left ventricular hypertrophy. Left ventricular diastolic parameters are consistent with Grade I diastolic dysfunction (impaired relaxation). Right Ventricle: The right ventricular size is normal. No increase in right ventricular wall thickness. Right ventricular systolic function is normal. There is normal pulmonary artery systolic pressure. The tricuspid regurgitant velocity is 2.62 m/s, and  with an assumed right atrial pressure of 3 mmHg, the estimated right ventricular systolic pressure is 30.5 mmHg. Left Atrium: Left atrial size was moderately dilated. Right Atrium: Right atrial size was normal in size. Pericardium: There is no evidence of pericardial effusion. Mitral Valve: The mitral valve is grossly normal. Mild mitral valve regurgitation. MV peak gradient, 12.4 mmHg. The mean mitral valve gradient is 5.0 mmHg. Tricuspid Valve: The tricuspid valve is not well visualized. Tricuspid valve regurgitation is not demonstrated. Aortic Valve: The aortic valve was not well visualized. Aortic valve  regurgitation is trivial. Aortic regurgitation PHT measures 279 msec. Aortic valve mean gradient measures 29.3 mmHg. Aortic valve peak gradient measures 53.8 mmHg. Aortic valve area, by  VTI measures 1.29 cm. Pulmonic Valve: The pulmonic valve was not well visualized. Pulmonic valve regurgitation is not visualized. Aorta: The aortic root and ascending aorta are structurally normal, with no evidence of dilitation. Venous: The inferior vena cava is normal in size with greater than 50% respiratory variability, suggesting right atrial pressure of  3 mmHg. IAS/Shunts: The atrial septum is grossly normal.  LEFT VENTRICLE PLAX 2D LVIDd:         4.90 cm     Diastology LVIDs:         3.30 cm     LV e' medial:    4.38 cm/s LV PW:         1.70 cm     LV E/e' medial:  23.1 LV IVS:        1.40 cm     LV e' lateral:   4.38 cm/s LVOT diam:     2.40 cm     LV E/e' lateral: 23.1 LV SV:         95 LV SV Index:   50 LVOT Area:     4.52 cm  LV Volumes (MOD) LV vol d, MOD A2C: 46.0 ml LV vol d, MOD A4C: 94.4 ml LV vol s, MOD A2C: 30.9 ml LV vol s, MOD A4C: 36.8 ml LV SV MOD A2C:     15.1 ml LV SV MOD A4C:     94.4 ml LV SV MOD BP:      31.6 ml RIGHT VENTRICLE RV S prime:     14.70 cm/s TAPSE (M-mode): 1.6 cm LEFT ATRIUM             Index       RIGHT ATRIUM           Index LA diam:        4.10 cm 2.15 cm/m  RA Area:     12.20 cm LA Vol (A2C):   91.8 ml 48.05 ml/m RA Volume:   24.70 ml  12.93 ml/m LA Vol (A4C):   60.7 ml 31.77 ml/m LA Biplane Vol: 76.8 ml 40.20 ml/m  AORTIC VALVE                    PULMONIC VALVE AV Area (Vmax):    1.41 cm     PV Vmax:       2.10 m/s AV Area (Vmean):   1.27 cm     PV Vmean:      220.000 cm/s AV Area (VTI):     1.29 cm     PV VTI:        0.394 m AV Vmax:           366.67 cm/s  PV Peak grad:  17.6 mmHg AV Vmean:          246.000 cm/s PV Mean grad:  21.0 mmHg AV VTI:            0.739 m AV Peak Grad:      53.8 mmHg AV Mean Grad:      29.3 mmHg LVOT Vmax:         114.00 cm/s LVOT Vmean:        69.300  cm/s LVOT VTI:          0.211 m LVOT/AV VTI ratio: 0.29 AI PHT:            279 msec  AORTA Ao Root diam: 2.80 cm Ao Asc diam:  3.10 cm MITRAL VALVE                TRICUSPID VALVE MV Area (PHT): 4.21 cm     TR Peak grad:   27.5 mmHg MV Area VTI:   2.90 cm     TR Vmax:        262.00 cm/s MV Peak grad:  12.4 mmHg MV Mean grad:  5.0 mmHg     SHUNTS MV Vmax:       1.76 m/s     Systemic VTI:  0.21 m MV Vmean:      99.1 cm/s    Systemic Diam: 2.40 cm MV Decel Time: 180 msec MR Peak grad: 93.3 mmHg MR Mean grad: 62.0 mmHg MR Vmax:      483.00 cm/s MR Vmean:     376.0 cm/s MV E velocity: 101.00 cm/s MV A velocity: 162.00 cm/s MV E/A ratio:  0.62 Kristeen Miss MD Electronically signed by Kristeen Miss MD Signature Date/Time: 06/01/2020/4:42:14 PM    Final    VAS US CAROTID (at [facility] and [facility] only)  Result Date: 06/02/2020 Carotid Arterial Duplex Study Patient Name:  AVRIE KEDZIERSKI  Date of Exam:   06/01/2020 Medical Rec #: [id number]        Accession #:    [id number] Date of Birth: [birth year]        Patient Gender: F Patient Age:   65Y Exam Location:  [facility] Procedure:      VAS US CAROTID Referring Phys: [id number] RONDELL A SMITH --------------------------------------------------------------------------------  Indications:       CVA. Risk Factors:      Hypertension, hyperlipidemia, Diabetes, current smoker,                    coronary artery disease. Comparison Study:  No prior studies. Performing Technologist: Jean Rosenthal RDMS,RVT  Examination Guidelines: A complete evaluation includes B-mode imaging, spectral Doppler, color Doppler, and power Doppler as needed of all accessible portions of each vessel. Bilateral testing is considered an integral part of a complete examination. Limited examinations for reoccurring indications may be performed as noted.  Right Carotid Findings: +----------+--------+--------+--------+-------------------------+--------+           PSV cm/sEDV cm/sStenosisPlaque Description        Comments +----------+--------+--------+--------+-------------------------+--------+ CCA Prox  90      22                                                +----------+--------+--------+--------+-------------------------+--------+ CCA Distal71      20              calcific                          +----------+--------+--------+--------+-------------------------+--------+ ICA Prox  71      26      1-39%   focal and calcific                +----------+--------+--------+--------+-------------------------+--------+ ICA Distal109     44                                                +----------+--------+--------+--------+-------------------------+--------+ ECA       288     25      Occludedcalcific and heterogenous         +----------+--------+--------+--------+-------------------------+--------+ +----------+--------+-------+----------------+-------------------+           PSV cm/sEDV cmsDescribe        Arm Pressure (mmHG) +----------+--------+-------+----------------+-------------------+ Subclavian175            Multiphasic, WNL                    +----------+--------+-------+----------------+-------------------+ +---------+--------+--+--------+--+---------+  VertebralPSV cm/s64EDV cm/s22Antegrade +---------+--------+--+--------+--+---------+  Left Carotid Findings: +----------+--------+--------+--------+------------------+--------+           PSV cm/sEDV cm/sStenosisPlaque DescriptionComments +----------+--------+--------+--------+------------------+--------+ CCA Prox  134     35                                         +----------+--------+--------+--------+------------------+--------+ CCA Distal89      25              heterogenous               +----------+--------+--------+--------+------------------+--------+ ICA Prox  109     47      1-39%   heterogenous               +----------+--------+--------+--------+------------------+--------+ ICA  Mid   117     54                                         +----------+--------+--------+--------+------------------+--------+ ICA Distal126     55                                         +----------+--------+--------+--------+------------------+--------+ ECA       133     16              calcific                   +----------+--------+--------+--------+------------------+--------+ +----------+--------+--------+----------------+-------------------+           PSV cm/sEDV cm/sDescribe        Arm Pressure (mmHG) +----------+--------+--------+----------------+-------------------+ [id number]             Multiphasic, WNL                    +----------+--------+--------+----------------+-------------------+ +---------+--------+--+--------+--+---------+ VertebralPSV cm/s64EDV cm/s20Antegrade +---------+--------+--+--------+--+---------+   Summary: Right Carotid: Velocities in the right ICA are consistent with a 1-39% stenosis.                The ECA appears >50% stenosed. Left Carotid: Velocities in the left ICA are consistent with a 1-39% stenosis. Vertebrals:  Bilateral vertebral arteries demonstrate antegrade flow. Subclavians: Normal flow hemodynamics were seen in bilateral subclavian              arteries. *See table(s) above for measurements and observations.  Electronically signed by Delia Heady MD on 06/02/2020 at 11:01:56 AM.    Final    Disposition   Pt is being discharged home today in good condition.  Follow-up Plans & Appointments     Follow-up Information    Jodelle Gross, NP Follow up on 06/23/2020.   Specialties: Nurse Practitioner, Radiology, Cardiology Why: @2 :15pm for your follow up appt Contact information: [address] [place] [phone]              Discharge Instructions    Call MD for:  difficulty breathing, headache or visual disturbances   Complete by: As directed    Call MD for:  persistant  dizziness or light-headedness   Complete by: As directed    Call MD for:  redness, tenderness, or signs of infection (pain, swelling, redness, odor or green/yellow discharge around incision site)   Complete by: As  directed    Diet - low sodium heart healthy   Complete by: As directed    Discharge instructions   Complete by: As directed    Groin Site Care Refer to this sheet in the next few weeks. These instructions provide you with information on caring for yourself after your procedure. Your caregiver may also give you more specific instructions. Your treatment has been planned according to current medical practices, but problems sometimes occur. Call your caregiver if you have any problems or questions after your procedure. HOME CARE INSTRUCTIONS You may shower 24 hours after the procedure. Remove the bandage (dressing) and gently wash the site with plain soap and water. Gently pat the site dry.  Do not apply powder or lotion to the site.  Do not sit in a bathtub, swimming pool, or whirlpool for 5 to 7 days.  No bending, squatting, or lifting anything over 10 pounds (4.5 kg) as directed by your caregiver.  Inspect the site at least twice daily.  Do not drive home if you are discharged the same day of the procedure. Have someone else drive you.  You may drive 24 hours after the procedure unless otherwise instructed by your caregiver.  What to expect: Any bruising will usually fade within 1 to 2 weeks.  Blood that collects in the tissue (hematoma) may be painful to the touch. It should usually decrease in size and tenderness within 1 to 2 weeks.  SEEK IMMEDIATE MEDICAL CARE IF: You have unusual pain at the groin site or down the affected leg.  You have redness, warmth, swelling, or pain at the groin site.  You have drainage (other than a small amount of blood on the dressing).  You have chills.  You have a fever or persistent symptoms for more than 72 hours.  You have a fever and your  symptoms suddenly get worse.  Your leg becomes pale, cool, tingly, or numb.  You have heavy bleeding from the site. Hold pressure on the site. .   Increase activity slowly   Complete by: As directed       Discharge Medications   Allergies as of 06/15/2020      Reactions   Codeine    Unknown      Medication List    STOP taking these medications   pioglitazone 45 MG tablet Commonly known as: ACTOS     TAKE these medications   amitriptyline 50 MG tablet Commonly known as: ELAVIL Take 50 mg by mouth at bedtime.   amLODipine 10 MG tablet Commonly known as: NORVASC Take 10 mg by mouth daily.   desvenlafaxine 50 MG 24 hr tablet Commonly known as: PRISTIQ Take 50 mg by mouth daily.   glimepiride 4 MG tablet Commonly known as: AMARYL Take 4 mg by mouth daily with breakfast.   levothyroxine 50 MCG tablet Commonly known as: SYNTHROID Take 50 mcg by mouth daily before breakfast.   metoprolol tartrate 25 MG tablet Commonly known as: LOPRESSOR Take 1 tablet (25 mg total) by mouth 2 (two) times daily.   ondansetron 8 MG disintegrating tablet Commonly known as: ZOFRAN-ODT Take 8 mg by mouth every 8 (eight) hours as needed for nausea or vomiting.   oxyCODONE 30 MG 12 hr tablet Take 1 tablet by mouth every 12 (twelve) hours.   rosuvastatin 40 MG tablet Commonly known as: CRESTOR Take 40 mg by mouth daily.   sitaGLIPtin 50 MG tablet Commonly known as: JANUVIA Take 50 mg by mouth daily.  Outstanding Labs/Studies   TAVR CTs  Duration of Discharge Encounter   Greater than 30 minutes including physician time.  Signed, Laverda Page, NP 06/15/2020, 10:18 AM

## 2020-06-15 NOTE — Progress Notes (Signed)
Advised cardiology concerning pt having right sided lower back pain. Pt reports that pain does not feel different from chronic back pain. Advised cardiology that scheduled pain med was administered.

## 2020-06-15 NOTE — Progress Notes (Signed)
Pt is alert and oriented. Discharge instructions given to pt. 

## 2020-06-15 NOTE — Progress Notes (Signed)
Pt's home medication from the pharmacy given to pt.

## 2020-06-15 NOTE — Discharge Instructions (Addendum)

## 2020-06-23 ENCOUNTER — Ambulatory Visit: Payer: Medicare Other | Admitting: Adult Health

## 2020-07-13 ENCOUNTER — Inpatient Hospital Stay: Payer: Medicare Other | Admitting: Adult Health

## 2020-07-19 NOTE — Progress Notes (Signed)
Cardiology Office Note:    Date:  05/15/2020   ID:  [name], [name] 18-Jul-1955, MRN [id number]  PCP:  Galvin Proffer, MD  Cardiologist:  Parke Poisson, MD  Electrophysiologist:  None   Referring MD: Galvin Proffer, MD   Chief Complaint/Reason for Referral: Severe aortic valve stenosis  History of Present Illness:    Kristina Montgomery is a 65 y.o. female with a history of diabetes type 2, hyperlipidemia, COPD, hypertension, active smoking.  Presents for further evaluation of severe aortic valve stenosis noted on an outside echocardiogram, confirmed with echocardiogram at our facility.  She is an active smoker, smoking two packs a day for 45 years.Today she reports that she is feeling weak and that she has no energy. She is accompanied by her son, Kristina Montgomery who provides collaborative history.   She reports having exertional chest pressure, tightness, SOB, and lightheadedness. She reports that she usually has trouble walking to different rooms in her house due to the symptoms. When she begins to walk, she loses energy and her chest tightness and lightheadedness begins. It tends to last for a couple minutes once it starts and then it resolves with rest.   She was referred for concern of severe aortic valve stenosis, we discussed in detail the natural history of aortic valve stenosis, mortality associated with symptomatic aortic valve stenosis, and indications for aortic valve replacement both surgical and transcatheter.  Denies PND, orthopnea, leg swelling.  Denies lifetime syncope.  Denies cough, fever, chills, nausea, vomiting, diarrhea.  Past Medical History:  Diagnosis Date   Anxiety    Asthma    Chest pain    COPD (chronic obstructive pulmonary disease) (HCC)    Coronary artery disease    Depression    Diabetes mellitus without complication (HCC)    Dizziness    Emphysema of lung (HCC)    Fatigue    Fever    Fibromyalgia    Heart murmur    Hypertension    Mixed  hyperlipidemia    Muscle pain    Rash    foot   Swelling     Past Surgical History:  Procedure Laterality Date   CHOLECYSTECTOMY     RIGHT/LEFT HEART CATH AND CORONARY ANGIOGRAPHY N/A 06/14/2020   Procedure: RIGHT/LEFT HEART CATH AND CORONARY ANGIOGRAPHY;  Surgeon: Kathleene Hazel, MD;  Location: [facility];  Service: Cardiovascular;  Laterality: N/A;    Current Medications: Current Meds  Medication Sig   oxyCODONE 30 MG 12 hr tablet Take 1 tablet by mouth every 12 (twelve) hours.   [DISCONTINUED] Aclidinium Bromide 400 MCG/ACT AEPB Inhale into the lungs. (Patient not taking: Reported on 06/01/2020)   [DISCONTINUED] albuterol (PROVENTIL HFA;VENTOLIN HFA) 108 (90 BASE) MCG/ACT inhaler Inhale into the lungs every 6 (six) hours as needed for wheezing or shortness of breath.   [DISCONTINUED] Alogliptin-Pioglitazone (OSENI PO) Take 25 mg by mouth.   [DISCONTINUED] ALPRAZolam (XANAX) 1 MG tablet Take 1 mg by mouth at bedtime as needed for anxiety.   [DISCONTINUED] amLODipine-olmesartan (AZOR) 10-40 MG tablet Take 1 tablet by mouth daily.   [DISCONTINUED] ARIPiprazole (ABILIFY) 10 MG tablet Take 10 mg by mouth daily. (Patient not taking: Reported on 06/01/2020)   [DISCONTINUED] carisoprodol (SOMA) 350 MG tablet Take 350 mg by mouth 4 (four) times daily as needed for muscle spasms.   [DISCONTINUED] doxepin (SINEQUAN) 100 MG capsule Take 100 mg by mouth at bedtime as needed. (Patient not taking: Reported on 06/01/2020)   [  DISCONTINUED] DULoxetine (CYMBALTA) 30 MG capsule TK ONE C PO D   [DISCONTINUED] estradiol (ESTRACE) 0.5 MG tablet Take 0.5 mg by mouth daily.   [DISCONTINUED] FLUoxetine (PROZAC) 20 MG tablet Take 20 mg by mouth daily.   [DISCONTINUED] methimazole (TAPAZOLE) 5 MG tablet TK 1 T PO D   [DISCONTINUED] nebivolol (BYSTOLIC) 10 MG tablet Take 10 mg by mouth daily. (Patient not taking: Reported on 06/01/2020)   [DISCONTINUED] oxyCODONE-acetaminophen (PERCOCET) 10-325 MG  per tablet Take 1 tablet by mouth every 4 (four) hours as needed for pain. Patient states that it is the 10 mg/lc   [DISCONTINUED] topiramate (TOPAMAX) 50 MG tablet Take 50 mg by mouth 2 (two) times daily.     Allergies:   Codeine   Social History   Tobacco Use   Smoking status: Every Day    Packs/day: 1.00    Years: 35.00    Pack years: 35.00    Types: Cigarettes   Smokeless tobacco: Never  Vaping Use   Vaping Use: Never used  Substance Use Topics   Alcohol use: No   Drug use: No     Family History: The patient's family history includes Cancer in her father; Heart disease in her mother.  ROS:   Please see the history of present illness.    All other systems reviewed and are negative.  EKGs/Labs/Other Studies Reviewed:    The following studies were reviewed today:  EKG:  05/15/2020- sinus tachycardia, right bundle branch block, left ventricle hypertrophy  Recent Labs: 06/13/2020: Magnesium 2.2; TSH 22.202 06/15/2020: BUN 14; Creatinine, Ser 0.62; Hemoglobin 16.4; Platelets 332; Potassium 4.3; Sodium 135  Recent Lipid Panel    Component Value Date/Time   CHOL 192 06/14/2020 0057   TRIG 140 06/14/2020 0057   HDL 57 06/14/2020 0057   CHOLHDL 3.4 06/14/2020 0057   VLDL 28 06/14/2020 0057   LDLCALC 107 (H) 06/14/2020 0057    Physical Exam:    VS:  BP 124/72   Pulse (!) 113   Ht 5\' 5"  (1.651 m)   Wt 175 lb 3.2 oz (79.5 kg)   SpO2 95%   BMI 29.15 kg/m     Wt Readings from Last 5 Encounters:  06/15/20 174 lb (78.9 kg)  06/13/20 181 lb 9.6 oz (82.4 kg)  06/01/20 184 lb 4.9 oz (83.6 kg)  05/23/20 188 lb (85.3 kg)  05/15/20 175 lb 3.2 oz (79.5 kg)    Constitutional: No acute distress Eyes: sclera non-icteric, normal conjunctiva and lids ENMT: normal dentition, moist mucous membranes Cardiovascular:  regular rhythm, tachycardic rate, Systolic ejection 3/6 late peaking obscures S2, S1 normal. Radial pulses normal bilaterally. No jugular venous distention.   Respiratory: clear to auscultation bilaterally GI : normal bowel sounds, soft and nontender. No distention.   MSK: extremities warm, well perfused. No edema.  NEURO: grossly nonfocal exam, moves all extremities. PSYCH: alert and oriented x 3, normal mood and affect.   ASSESSMENT:    1. Severe aortic stenosis   2. Primary hypertension   3. Hyperlipidemia, unspecified hyperlipidemia type   4. Right bundle branch block    PLAN:    Severe aortic stenosis - Plan: EKG 12-Lead, Ambulatory referral to Structural Heart/Valve Clinic (only at [facility])  Stage D symptomatic severe aortic valve stenosis.  Patient has a mean gradient of 40 mmHg on echo with a V-max of 4.01 m/s, and a calculated valve area of 1.07 cm.  With her symptoms I suspect this represents severe aortic valve stenosis.  We have discussed the natural history of severe aortic valve stenosis as well as options for treatment with symptoms.  Will refer to the structural heart team for further work-up which will include an investigation of her coronary arteries given risk factors of ongoing tobacco use, diabetes, hypertension, hyperlipidemia.  I unfortunately do not have laboratory studies for her lipids or diabetes, we will obtain these in the process of her work-up.  Primary hypertension- blood pressure well controlled today on amlodipine olmesartan combination tab, and Bystolic.  Continue at this time.  She is with sinus tachycardia, unclear etiology.  Hyperlipidemia, unspecified hyperlipidemia type- we will need lipids as part of her work-up to continue to risk stratify.  Right bundle branch block-to be noted as part of her TAVR work-up.  No syncope though she does experience some lightheadedness, I suspect this is more related to the valve then conduction delay.  Total time of encounter: 60 minutes total time of encounter, including 38 minutes spent in face-to-face patient care on the date of this encounter. This time includes  coordination of care and counseling regarding above mentioned problem list. Remainder of non-face-to-face time involved reviewing chart documents/testing relevant to the patient encounter and documentation in the medical record. I have independently reviewed documentation from referring provider.   Weston Brass, MD, [facility]  [facility]   Medication Adjustments/Labs and Tests Ordered: Current medicines are reviewed at length with the patient today.  Concerns regarding medicines are outlined above.   Orders Placed This Encounter  Procedures   Ambulatory referral to Structural Heart/Valve Clinic (only at [facility])   EKG 12-Lead    No orders of the defined types were placed in this encounter.   Patient Instructions  Medication Instructions:  No Changes In Medications at this time.  *If you need a refill on your cardiac medications before your next appointment, please call your pharmacy*  Follow-Up: At [facility], you and your health needs are our priority.  As part of our continuing mission to provide you with exceptional heart care, we have created designated Provider Care Teams.  These Care Teams include your primary Cardiologist (physician) and Advanced Practice Providers (APPs -  Physician Assistants and Nurse Practitioners) who all work together to provide you with the care you need, when you need it.  We recommend signing up for the patient portal called "MyChart".  Sign up information is provided on this After Visit Summary.  MyChart is used to connect with patients for Virtual Visits (Telemedicine).  Patients are able to view lab/test results, encounter notes, upcoming appointments, etc.  Non-urgent messages can be sent to your provider as well.   To learn more about what you can do with MyChart, go to [email or web address].    Your next appointment:   To Be Determined  The format for your next appointment:   In Person  Provider:   Weston Brass,  MD  Other Instructions REFERRAL TO STRUCTURAL HEART VALVE CLINIC- SOMEONE WILL REACH OUT TO YOU TO SCHEDULE THIS.     Danelle Earthly [place],acting as a Neurosurgeon for Parke Poisson, MD.,have documented all relevant documentation on the behalf of Parke Poisson, MD,as directed by  Parke Poisson, MD while in the presence of Parke Poisson, MD.  I, Parke Poisson, MD, have reviewed all documentation for this visit. The documentation on 05/15/20 for the exam, diagnosis, procedures, and orders are all accurate and complete.

## 2020-07-20 ENCOUNTER — Telehealth: Payer: Self-pay

## 2020-07-20 NOTE — Telephone Encounter (Signed)
Attempted to reach the pt to arrange pre TAVR CT scans. Surgery has been delayed due to recent CVA but Dr Clifton James requested that pt proceed with TAVR testing. The pt's voicemail is full at this time.

## 2020-07-21 NOTE — Telephone Encounter (Signed)
Called patient to arrange pre-TAVR scans.  Unable to leave message as VM is full - will try again later.

## 2020-07-25 NOTE — Telephone Encounter (Signed)
Called patient to arrange pre-TAVR scans.  Unable to leave message as VM is full - will try again later. 

## 2020-07-26 ENCOUNTER — Encounter: Payer: Medicare Other | Admitting: Surgery

## 2020-07-28 NOTE — Telephone Encounter (Signed)
Called patient to arrange pre-TAVR scans.  Unable to leave message as VM is full - will try again later. 

## 2020-08-10 ENCOUNTER — Encounter: Payer: Self-pay | Admitting: Adult Health

## 2020-08-10 ENCOUNTER — Inpatient Hospital Stay: Payer: Medicare Other | Admitting: Adult Health

## 2020-08-10 NOTE — Progress Notes (Deleted)
[facility] [address] [place]. [place] [address] [phone]       HOSPITAL FOLLOW UP NOTE  [name] Date of Birth:  04-26-1955 Medical Record Number:  [id number]   Reason for Referral:  hospital stroke follow up    SUBJECTIVE:   CHIEF COMPLAINT:  No chief complaint on file.   HPI:   [name] is a 65 y.o. female with history of   COPD, coronary artery disease, depression, diabetes, aortic stenosis, hypertension, hyperlipidemia who was electively admitted to the short stay area for cardiac cath for potential TAVR for aortic stenosis. Per note, she took an oxycodone the day of admission prior to coming in and noted to be very lethargic and drowsy therefore cath canceled. STAT CTH notable for acute stroke in the mid left occipital lobe near the parietal lobe-occipital junction, likely acute.  Code stroke was called and further stroke work-up completed which revealed multiple left parietal subacute strokes secondary to left M2 branch stenosis likely of embolic etiology.  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  Carotid Doppler right ICA 1 to 39% stenosis with ECA appearing > 50% stenosed and left ICA 1 to 39% stenosis.  EF 60% with dilated currently.  LDL 114.  A1c 11.1.  Recommended placement of loop recorder and TEE to assess for cardioembolic source outpatient.  Recommended DAPT for 3 weeks and aspirin alone as well as initiating atorvastatin 80 mg daily.  Other stroke risk factors include advanced age, current tobacco use, THC use and obesity.   Discharged home in stable condition without therapy needs.  Today, 08/10/2020, [name] is being seen for hospital follow-up.  She has been stable from stroke standpoint without new stroke/TIA symptoms.  She has completed 3 weeks DAPT and remains on aspirin alone as well as atorvastatin without associated side effects.  Blood pressure today ***.  Glucose levels ***.  She has since completed cardiac cath with mild nonobstructive CAD and recommended medical therapy.      ROS:   14 system review of systems performed and negative with exception of ***  PMH:  Past Medical History:  Diagnosis Date   Anxiety    Asthma    Chest pain    COPD (chronic obstructive pulmonary disease) (HCC)    Coronary artery disease    Depression    Diabetes mellitus without complication (HCC)    Dizziness    Emphysema of lung (HCC)    Fatigue    Fever    Fibromyalgia    Heart murmur    Hypertension    Mixed hyperlipidemia    Muscle pain    Rash    foot   Swelling     PSH:  Past Surgical History:  Procedure Laterality Date   CHOLECYSTECTOMY     RIGHT/LEFT HEART CATH AND CORONARY ANGIOGRAPHY N/A 06/14/2020   Procedure: RIGHT/LEFT HEART CATH AND CORONARY ANGIOGRAPHY;  Surgeon: Kathleene Hazel, MD;  Location: [facility];  Service: Cardiovascular;  Laterality: N/A;    Social History:  Social History   Socioeconomic History   Marital status: Widowed    Spouse name: Not  on file   Number of children: 4   Years of education: Not on file   Highest education level: Not on file  Occupational History   Occupation: On disability for 20 years  Tobacco Use   Smoking status: Every Day    Packs/day: 1.00    Years: 35.00    Pack years: 35.00    Types: Cigarettes   Smokeless tobacco: Never  Vaping Use   Vaping Use: Never used  Substance and Sexual Activity   Alcohol use: No   Drug use: No   Sexual activity: Not on file  Other Topics Concern   Not on file  Social History Narrative    Not on file   Social Determinants of Health   Financial Resource Strain: Not on file  Food Insecurity: Not on file  Transportation Needs: Not on file  Physical Activity: Not on file  Stress: Not on file  Social Connections: Not on file  Intimate Partner Violence: Not on file    Family History:  Family History  Problem Relation Age of Onset   Heart disease Mother    Cancer Father     Medications:   Current Outpatient Medications on File Prior to Visit  Medication Sig Dispense Refill   amitriptyline (ELAVIL) 50 MG tablet Take 50 mg by mouth at bedtime.     amLODipine (NORVASC) 10 MG tablet Take 10 mg by mouth daily.     desvenlafaxine (PRISTIQ) 50 MG 24 hr tablet Take 50 mg by mouth daily.     glimepiride (AMARYL) 4 MG tablet Take 4 mg by mouth daily with breakfast.     levothyroxine (SYNTHROID) 50 MCG tablet Take 50 mcg by mouth daily before breakfast.     metoprolol tartrate (LOPRESSOR) 25 MG tablet Take 1 tablet (25 mg total) by mouth 2 (two) times daily. 60 tablet 1   ondansetron (ZOFRAN-ODT) 8 MG disintegrating tablet Take 8 mg by mouth every 8 (eight) hours as needed for nausea or vomiting.     oxyCODONE 30 MG 12 hr tablet Take 1 tablet by mouth every 12 (twelve) hours.     rosuvastatin (CRESTOR) 40 MG tablet Take 40 mg by mouth daily.     sitaGLIPtin (JANUVIA) 50 MG tablet Take 50 mg by mouth daily.     No current facility-administered medications on file prior to visit.    Allergies:   Allergies  Allergen Reactions   Codeine     Unknown      OBJECTIVE:  Physical Exam  There were no vitals filed for this visit. There is no height or weight on file to calculate BMI. No results found.  No flowsheet data found.   General: well developed, well nourished, seated, in no evident distress Head: head normocephalic and atraumatic.   Neck: supple with no carotid or supraclavicular bruits Cardiovascular: regular rate and rhythm, no murmurs Musculoskeletal: no  deformity Skin:  no rash/petichiae Vascular:  Normal pulses all extremities   Neurologic Exam Mental Status: Awake and fully alert. Oriented to place and time. Recent and remote memory intact. Attention span, concentration and fund of knowledge appropriate. Mood and affect appropriate.  Cranial Nerves: Fundoscopic exam reveals sharp disc margins. Pupils equal, briskly reactive to light. Extraocular movements full without nystagmus. Visual fields full to confrontation. Hearing intact. Facial sensation intact. Face, tongue, palate moves normally and symmetrically.  Motor: Normal bulk and tone. Normal strength in all tested extremity muscles Sensory.: intact to touch , pinprick , position and vibratory sensation.  Coordination: Rapid alternating movements normal in all extremities. Finger-to-nose and heel-to-shin performed accurately bilaterally. Gait and Station: Arises from chair without difficulty. Stance is normal. Gait demonstrates normal stride length and balance with ***. Tandem walk and heel toe ***.  Reflexes: 1+ and symmetric. Toes downgoing.     NIHSS  *** Modified Rankin  ***      ASSESSMENT: Kristina Montgomery is a 65 y.o. year old female with recent multiple left parietal subacute strokes secondary to left M2 branch stenosis, likely embolic secondary to unknown source on 06/01/2020.  Vascular risk factors include HTN, HLD, CAD, aortic stenosis, tobacco use and THC use.  MRA head also showed evidence of multiple small vascular protrusions possibly aneurysms.     PLAN:  Left parietal strokes: Residual deficit: ***. Continue {anticoagulants:31417}  and ***  for secondary stroke prevention.  Discussed secondary stroke prevention measures and importance of close PCP follow up for aggressive stroke risk factor management  HTN: BP goal <130/90.  Stable on *** per PCP HLD: LDL goal <70. Recent LDL 114 -continue atorvastatin 80 mg daily.  DMII: A1c goal<7.0. Recent A1c 11.1.      Follow up in *** or call earlier if needed   CC:  [facility] provider: Dr. Pearlean Brownie PCP: Galvin Proffer, MD    I spent *** minutes of face-to-face and non-face-to-face time with patient.  This included previsit chart review, lab review, study review, order entry, electronic health record documentation, patient education regarding recent stroke, residual deficits, importance of managing stroke risk factors and answered all questions to patient satisfaction     Ihor Austin, [facility]  [facility] [address] [place], [place] [address]  Phone [phone] Fax [phone] Note: This document was prepared with digital dictation and possible smart phrase technology. Any transcriptional errors that result from this process are unintentional.

## 2020-09-14 NOTE — Progress Notes (Deleted)
Structural Heart Clinic Note  No chief complaint on file.   History of Present Illness: 65 yo female with history of asthma/COPD, current everyday smoker, diabetes mellitus type 2, hyperlipidemia, HTN, anxiety/depression, fibromyalgia, and severe aortic stenosis who is here today for follow up in the structural heart clinic. I saw her as a new consult in May 2022. She was referred by Dr. Jacques Navy, for further discussion regarding her aortic stenosis and possible TAVR. She is an active smoker, smoking 1 ppd for 45 years. She has COPD. She has diabetes and is on oral therapy. She has had progressive fatigue, dyspnea, dizziness and chest pressure. She was seen by Dr. Jacques Navy 05/15/20 for the first time. Echo 05/03/20 with LVEF=60-65%, mild LVH. The aortic valve leaflets are thickened and calcified with limited leaflet excursion. Mean gradient 40 mmHg, peak gradient 64.3 mmHg, AVA 0.98 cm2, dimensionless index 0.31, SVI 44. This was felt to represent severe aortic stenosis. At her first visit with me in May 2022, she described progressive dyspnea, chest pressure, fatigue and dizziness. No near syncope or syncope. She came in for her cardiac cath on 06/01/20 and was found to have altered mental status upon arrival to the hospital. She was found to have an acute stroke. Carotid dopplers with mild nonobstructive disease bilaterally. She was discharged on ASA and Plavix but she did not start Plavix as advised and reported missing doses of ASA. She came in for follow up in our cardiology office to see Bettina Gavia, PA-C. She was having chest pain in the office and was admitted. Cardiac cath 06/14/20 with mild non-obstructive CAD. Mean gradient 27.7 mmHg by cath. Echo May 2022 with normal LV systolic function.   She tells me today that she ***  She lives alone in [place], [place]. She has top dentures and no teeth on the bottom. She has been on disability for 20 years due to depression.   Primary Care Physician: Galvin Proffer, MD Primary Cardiologist: Jacques Navy Referring Cardiologist: Jacques Navy  Past Medical History:  Diagnosis Date   Anxiety    Asthma    Chest pain    COPD (chronic obstructive pulmonary disease) (HCC)    Coronary artery disease    Depression    Diabetes mellitus without complication (HCC)    Dizziness    Emphysema of lung (HCC)    Fatigue    Fever    Fibromyalgia    Heart murmur    Hypertension    Mixed hyperlipidemia    Muscle pain    Rash    foot   Swelling     Past Surgical History:  Procedure Laterality Date   CHOLECYSTECTOMY     RIGHT/LEFT HEART CATH AND CORONARY ANGIOGRAPHY N/A 06/14/2020   Procedure: RIGHT/LEFT HEART CATH AND CORONARY ANGIOGRAPHY;  Surgeon: Kathleene Hazel, MD;  Location: [facility];  Service: Cardiovascular;  Laterality: N/A;    Current Outpatient Medications  Medication Sig Dispense Refill   amitriptyline (ELAVIL) 50 MG tablet Take 50 mg by mouth at bedtime.     amLODipine (NORVASC) 10 MG tablet Take 10 mg by mouth daily.     desvenlafaxine (PRISTIQ) 50 MG 24 hr tablet Take 50 mg by mouth daily.     glimepiride (AMARYL) 4 MG tablet Take 4 mg by mouth daily with breakfast.     levothyroxine (SYNTHROID) 50 MCG tablet Take 50 mcg by mouth daily before breakfast.     metoprolol tartrate (LOPRESSOR) 25 MG tablet Take 1 tablet (25  mg total) by mouth 2 (two) times daily. 60 tablet 1   ondansetron (ZOFRAN-ODT) 8 MG disintegrating tablet Take 8 mg by mouth every 8 (eight) hours as needed for nausea or vomiting.     oxyCODONE 30 MG 12 hr tablet Take 1 tablet by mouth every 12 (twelve) hours.     rosuvastatin (CRESTOR) 40 MG tablet Take 40 mg by mouth daily.     sitaGLIPtin (JANUVIA) 50 MG tablet Take 50 mg by mouth daily.     No current facility-administered medications for this visit.    Allergies  Allergen Reactions   Codeine     Unknown    Social History   Socioeconomic History   Marital status: Widowed    Spouse name: Not  on file   Number of children: 4   Years of education: Not on file   Highest education level: Not on file  Occupational History   Occupation: On disability for 20 years  Tobacco Use   Smoking status: Every Day    Packs/day: 1.00    Years: 35.00    Pack years: 35.00    Types: Cigarettes   Smokeless tobacco: Never  Vaping Use   Vaping Use: Never used  Substance and Sexual Activity   Alcohol use: No   Drug use: No   Sexual activity: Not on file  Other Topics Concern   Not on file  Social History Narrative   Not on file   Social Determinants of Health   Financial Resource Strain: Not on file  Food Insecurity: Not on file  Transportation Needs: Not on file  Physical Activity: Not on file  Stress: Not on file  Social Connections: Not on file  Intimate Partner Violence: Not on file    Family History  Problem Relation Age of Onset   Heart disease Mother    Cancer Father     Review of Systems:  As stated in the HPI and otherwise negative.   There were no vitals taken for this visit.  Physical Examination: General: Well developed, well nourished, NAD  HEENT: OP clear, mucus membranes moist  SKIN: warm, dry. No rashes. Neuro: No focal deficits  Musculoskeletal: Muscle strength 5/5 all ext  Psychiatric: Mood and affect normal  Neck: No JVD, no carotid bruits, no thyromegaly, no lymphadenopathy.  Lungs:Clear bilaterally, no wheezes, rhonci, crackles Cardiovascular: Regular rate and rhythm. *** Systolic murmur.  Abdomen:Soft. Bowel sounds present. Non-tender.  Extremities: *** No lower extremity edema. Pulses are 2 + in the bilateral DP/PT. . EKG:  EKG is not *** ordered today. The ekg ordered today demonstrates   Cardiac cath 06/14/20: Prox RCA lesion is 30% stenosed. Mid RCA lesion is 30% stenosed. 2nd Mrg lesion is 30% stenosed. Ramus lesion is 30% stenosed.   1. Mild non-obstructive CAD 2. Severe aortic stenosis by echo (by cath mean gradient 27.7 mmHg, peak to  peak gradient 28 mmHg)  Echo 05/05/20:  1. Severe aortic valve stenosis.. The aortic valve is abnormal. There is  severe calcifcation of the aortic valve. Aortic valve regurgitation is  trivial. Severe aortic valve stenosis. Aortic valve area, by VTI measures  1.07 cm. Aortic valve mean  gradient measures 40.0 mmHg. Aortic valve Vmax measures 4.01 m/s.   2. Left ventricular ejection fraction, by estimation, is 60 to 65%. The  left ventricle has normal function. The left ventricle has no regional  wall motion abnormalities. There is mild left ventricular hypertrophy.  Indeterminate diastolic filling due to  E-A fusion.  3. Right ventricular systolic function is normal. The right ventricular  size is normal. Tricuspid regurgitation signal is inadequate for assessing  PA pressure.   4. Left atrial size was moderately dilated.   5. The mitral valve is degenerative. Trivial mitral valve regurgitation.  No evidence of mitral stenosis.   6. The inferior vena cava is normal in size with greater than 50%  respiratory variability, suggesting right atrial pressure of 3 mmHg.   FINDINGS   Left Ventricle: Left ventricular ejection fraction, by estimation, is 60  to 65%. The left ventricle has normal function. The left ventricle has no  regional wall motion abnormalities. Definity contrast agent was given IV  to delineate the left ventricular   endocardial borders. Global longitudinal strain performed but not  reported based on interpreter judgement due to suboptimal tracking. The  left ventricular internal cavity size was normal in size. There is mild  left ventricular hypertrophy. Indeterminate   diastolic filling due to E-A fusion.   Right Ventricle: The right ventricular size is normal. No increase in  right ventricular wall thickness. Right ventricular systolic function is  normal. Tricuspid regurgitation signal is inadequate for assessing PA  pressure.   Left Atrium: Left atrial size  was moderately dilated.   Right Atrium: Right atrial size was normal in size.   Pericardium: There is no evidence of pericardial effusion.   Mitral Valve: The mitral valve is degenerative in appearance. Mild mitral  annular calcification. Trivial mitral valve regurgitation. No evidence of  mitral valve stenosis. MV peak gradient, 15.8 mmHg. The mean mitral valve  gradient is 4.7 mmHg with  average heart rate of 90 bpm.   Tricuspid Valve: The tricuspid valve is normal in structure. Tricuspid  valve regurgitation is not demonstrated. No evidence of tricuspid  stenosis.   Aortic Valve: Severe aortic valve stenosis. The aortic valve is abnormal.  There is severe calcifcation of the aortic valve. Aortic valve  regurgitation is trivial. Severe aortic stenosis is present. Aortic valve  mean gradient measures 40.0 mmHg. Aortic  valve peak gradient measures 64.3 mmHg. Aortic valve area, by VTI measures  1.07 cm.   Pulmonic Valve: The pulmonic valve was normal in structure. Pulmonic valve  regurgitation is trivial. No evidence of pulmonic stenosis.   Aorta: The aortic root is normal in size and structure.   Venous: The inferior vena cava is normal in size with greater than 50%  respiratory variability, suggesting right atrial pressure of 3 mmHg.   IAS/Shunts: No atrial level shunt detected by color flow Doppler.      LEFT VENTRICLE  PLAX 2D  LVIDd:         5.30 cm  LVIDs:         3.60 cm  LV PW:         1.30 cm  LV IVS:        1.40 cm  LVOT diam:     2.10 cm  LV SV:         83  LV SV Index:   44  LVOT Area:     3.46 cm     LV Volumes (MOD)  LV vol d, MOD A2C: 77.5 ml  LV vol d, MOD A4C: 88.3 ml  LV vol s, MOD A2C: 43.1 ml  LV vol s, MOD A4C: 42.0 ml  LV SV MOD A2C:     34.4 ml  LV SV MOD A4C:     88.3 ml  LV SV MOD BP:  41.5 ml   RIGHT VENTRICLE  RV Basal diam:  4.20 cm  TAPSE (M-mode): 2.3 cm   LEFT ATRIUM             Index       RIGHT ATRIUM           Index   LA diam:        4.70 cm 2.48 cm/m  RA Area:     15.00 cm  LA Vol (A2C):   78.1 ml 41.29 ml/m RA Volume:   37.40 ml  19.77 ml/m  LA Vol (A4C):   79.4 ml 41.98 ml/m  LA Biplane Vol: 82.2 ml 43.46 ml/m   AORTIC VALVE  AV Area (Vmax):    0.98 cm  AV Area (Vmean):   0.97 cm  AV Area (VTI):     1.07 cm  AV Vmax:           401.00 cm/s  AV Vmean:          283.500 cm/s  AV VTI:            0.771 m  AV Peak Grad:      64.3 mmHg  AV Mean Grad:      40.0 mmHg  LVOT Vmax:         114.00 cm/s  LVOT Vmean:        79.100 cm/s  LVOT VTI:          0.239 m  LVOT/AV VTI ratio: 0.31     AORTA  Ao Root diam: 2.80 cm  Ao Asc diam:  3.00 cm   MITRAL VALVE  MV Area (PHT): 9.37 cm     SHUNTS  MV Area VTI:   2.56 cm     Systemic VTI:  0.24 m  MV Peak grad:  15.8 mmHg    Systemic Diam: 2.10 cm  MV Mean grad:  4.7 mmHg  MV Vmax:       1.99 m/s  MV Vmean:      129.0 cm/s  MV Decel Time: 81 msec  MV E velocity: 118.00 cm/s  MV A velocity: 182.00 cm/s  MV E/A ratio:  0.65   Recent Labs: 06/13/2020: Magnesium 2.2; TSH 22.202 06/15/2020: BUN 14; Creatinine, Ser 0.62; Hemoglobin 16.4; Platelets 332; Potassium 4.3; Sodium 135    Wt Readings from Last 3 Encounters:  06/15/20 174 lb (78.9 kg)  06/13/20 181 lb 9.6 oz (82.4 kg)  06/01/20 184 lb 4.9 oz (83.6 kg)     Other studies Reviewed: Additional studies/ records that were reviewed today include: Echo images, office notes, EKG Review of the above records demonstrates: severe AS  STS Risk Score: *** redo *** Risk of Mortality: 1.744% Renal Failure: 1.765% Permanent Stroke: 1.719% Prolonged Ventilation: 8.375% DSW Infection: 0.133% Reoperation: 2.846% Morbidity or Mortality: 11.550% Short Length of Stay: 42.357% Long Length of Stay: 5.150%  Assessment and Plan:   1. Severe Aortic Valve Stenosis: She has severe, stage D aortic valve stenosis. Her echo from April 2022 is reviewed again and shows mean gradient over 40 mmHg. Echo  from May 2022 with slightly lower AV gradients. I have personally reviewed the echo images. The aortic valve is thickened, calcified with limited leaflet excursion. I think she would benefit from AVR. She would be a candidate for open surgical AVR or TAVR.    I have reviewed the natural history of aortic stenosis with the patient and their family members  who are present today. We have discussed  the limitations of medical therapy and the poor prognosis associated with symptomatic aortic stenosis. We have reviewed potential treatment options, including palliative medical therapy, conventional surgical aortic valve replacement, and transcatheter aortic valve replacement. We discussed treatment options in the context of the patient's specific comorbid medical conditions.   She would like to continue with planning for TAVR. Cardiac cath has been completed. Risks and benefits of the valve procedure are reviewed with the patient. We will arrange a cardiac CT, CTA of the chest/abdomen and pelvis, PT assessment and she will then be referred to see Dr. Bartle with CT surgery.     Current medicines are reviewed at length with the patient today.  The patient Kristina Simmersdoes not have concerns regarding medicines.  The following changes have been made:  no change  Labs/ tests ordered today include:   No orders of the defined types were placed in this encounter.    Disposition:   F/U with the valve team    Signed, Verne Carrowhristopher Sylar Voong, MD 09/14/2020 7:00 PM    [facility] [address], [place], [place]  [address] Phone: [phone]; Fax: [phone]

## 2020-09-15 ENCOUNTER — Ambulatory Visit: Payer: Medicare Other | Admitting: Cardiovascular Disease

## 2020-09-19 NOTE — Progress Notes (Deleted)
[facility] Consult Note  No chief complaint on file.   History of Present Illness: 65 yo female with history of asthma/COPD, current everyday smoker, diabetes mellitus type 2, hyperlipidemia, HTN, anxiety/depression, fibromyalgia, and severe aortic stenosis who is here today as a new consult, referred by Dr. Jacques Navy, for further discussion regarding her aortic stenosis and possible TAVR. She is an active smoker, smoking 1 ppd for 45 years. She has COPD. She has diabetes and is on oral therapy. She has had progressive fatigue, dyspnea, dizziness and chest pressure. She was seen by Dr. Jacques Navy 05/15/20 for the first time. Echo 05/03/20 with LVEF=60-65%, mild LVH. The aortic valve leaflets are thickened and calcified with limited leaflet excursion. Mean gradient 40 mmHg, peak gradient 64.3 mmHg, AVA 0.98 cm2, dimensionless index 0.31, SVI 44. This is felt to represent severe aortic stenosis. At her first visit with me in May 2022, she described progressive dyspnea, chest pressure, fatigue and dizziness. No near syncope or syncope. She came in for her cardiac cath on 06/01/20 and was found to have altered mental status upon arrival to the hospital. She was found to have an acute stroke. Carotid dopplers with mild nonobstructive disease bilaterally. She was discharged on ASA and Plavix but she did not start Plavix as advised and reported missing doses of ASA. She came in for follow up in our cardiology office to see Bettina Gavia, PA-C. She was having chest pain in the office and was admitted. Cardiac cath 06/14/20 with mild non-obstructive CAD. Mean gradient 27.7 mmHg by cath. Echo May 2022 with normal LV systolic function.    She tells me today that she ***   She lives alone in [place], [place]. She has top dentures and no teeth on the bottom. She has been on disability for 20 years due to depression.     Primary Care Physician: Galvin Proffer, MD Primary Cardiologist: Jacques Navy Referring Cardiologist:  Jacques Navy  Past Medical History:  Diagnosis Date   Anxiety    Asthma    Chest pain    COPD (chronic obstructive pulmonary disease) (HCC)    Coronary artery disease    Depression    Diabetes mellitus without complication (HCC)    Dizziness    Emphysema of lung (HCC)    Fatigue    Fever    Fibromyalgia    Heart murmur    Hypertension    Mixed hyperlipidemia    Muscle pain    Rash    foot   Swelling     Past Surgical History:  Procedure Laterality Date   CHOLECYSTECTOMY     RIGHT/LEFT HEART CATH AND CORONARY ANGIOGRAPHY N/A 06/14/2020   Procedure: RIGHT/LEFT HEART CATH AND CORONARY ANGIOGRAPHY;  Surgeon: Kathleene Hazel, MD;  Location: [facility];  Service: Cardiovascular;  Laterality: N/A;    Current Outpatient Medications  Medication Sig Dispense Refill   amitriptyline (ELAVIL) 50 MG tablet Take 50 mg by mouth at bedtime.     amLODipine (NORVASC) 10 MG tablet Take 10 mg by mouth daily.     desvenlafaxine (PRISTIQ) 50 MG 24 hr tablet Take 50 mg by mouth daily.     glimepiride (AMARYL) 4 MG tablet Take 4 mg by mouth daily with breakfast.     levothyroxine (SYNTHROID) 50 MCG tablet Take 50 mcg by mouth daily before breakfast.     metoprolol tartrate (LOPRESSOR) 25 MG tablet Take 1 tablet (25 mg total) by mouth 2 (two) times daily. 60 tablet 1  ondansetron (ZOFRAN-ODT) 8 MG disintegrating tablet Take 8 mg by mouth every 8 (eight) hours as needed for nausea or vomiting.     oxyCODONE 30 MG 12 hr tablet Take 1 tablet by mouth every 12 (twelve) hours.     rosuvastatin (CRESTOR) 40 MG tablet Take 40 mg by mouth daily.     sitaGLIPtin (JANUVIA) 50 MG tablet Take 50 mg by mouth daily.     No current facility-administered medications for this visit.    Allergies  Allergen Reactions   Codeine     Unknown    Social History   Socioeconomic History   Marital status: Widowed    Spouse name: Not on file   Number of children: 4   Years of education: Not on file    Highest education level: Not on file  Occupational History   Occupation: On disability for 20 years  Tobacco Use   Smoking status: Every Day    Packs/day: 1.00    Years: 35.00    Pack years: 35.00    Types: Cigarettes   Smokeless tobacco: Never  Vaping Use   Vaping Use: Never used  Substance and Sexual Activity   Alcohol use: No   Drug use: No   Sexual activity: Not on file  Other Topics Concern   Not on file  Social History Narrative   Not on file   Social Determinants of Health   Financial Resource Strain: Not on file  Food Insecurity: Not on file  Transportation Needs: Not on file  Physical Activity: Not on file  Stress: Not on file  Social Connections: Not on file  Intimate Partner Violence: Not on file    Family History  Problem Relation Age of Onset   Heart disease Mother    Cancer Father     Review of Systems:  As stated in the HPI and otherwise negative.   There were no vitals taken for this visit.  Physical Examination: General: Well developed, well nourished, NAD  HEENT: OP clear, mucus membranes moist  SKIN: warm, dry. No rashes. Neuro: No focal deficits  Musculoskeletal: Muscle strength 5/5 all ext  Psychiatric: Mood and affect normal  Neck: No JVD, no carotid bruits, no thyromegaly, no lymphadenopathy.  Lungs:Clear bilaterally, no wheezes, rhonci, crackles Cardiovascular: Regular rate and rhythm. No murmurs, gallops or rubs. Abdomen:Soft. Bowel sounds present. Non-tender.  Extremities: No lower extremity edema. Pulses are 2 + in the bilateral DP/PT.  EKG:  EKG is *** ordered today. The ekg ordered today demonstrates   Echo 05/05/20:  1. Severe aortic valve stenosis.. The aortic valve is abnormal. There is  severe calcifcation of the aortic valve. Aortic valve regurgitation is  trivial. Severe aortic valve stenosis. Aortic valve area, by VTI measures  1.07 cm. Aortic valve mean  gradient measures 40.0 mmHg. Aortic valve Vmax measures 4.01  m/s.   2. Left ventricular ejection fraction, by estimation, is 60 to 65%. The  left ventricle has normal function. The left ventricle has no regional  wall motion abnormalities. There is mild left ventricular hypertrophy.  Indeterminate diastolic filling due to  E-A fusion.   3. Right ventricular systolic function is normal. The right ventricular  size is normal. Tricuspid regurgitation signal is inadequate for assessing  PA pressure.   4. Left atrial size was moderately dilated.   5. The mitral valve is degenerative. Trivial mitral valve regurgitation.  No evidence of mitral stenosis.   6. The inferior vena cava is normal in size with  greater than 50%  respiratory variability, suggesting right atrial pressure of 3 mmHg.   FINDINGS   Left Ventricle: Left ventricular ejection fraction, by estimation, is 60  to 65%. The left ventricle has normal function. The left ventricle has no  regional wall motion abnormalities. Definity contrast agent was given IV  to delineate the left ventricular   endocardial borders. Global longitudinal strain performed but not  reported based on interpreter judgement due to suboptimal tracking. The  left ventricular internal cavity size was normal in size. There is mild  left ventricular hypertrophy. Indeterminate   diastolic filling due to E-A fusion.   Right Ventricle: The right ventricular size is normal. No increase in  right ventricular wall thickness. Right ventricular systolic function is  normal. Tricuspid regurgitation signal is inadequate for assessing PA  pressure.   Left Atrium: Left atrial size was moderately dilated.   Right Atrium: Right atrial size was normal in size.   Pericardium: There is no evidence of pericardial effusion.   Mitral Valve: The mitral valve is degenerative in appearance. Mild mitral  annular calcification. Trivial mitral valve regurgitation. No evidence of  mitral valve stenosis. MV peak gradient, 15.8 mmHg. The  mean mitral valve  gradient is 4.7 mmHg with  average heart rate of 90 bpm.   Tricuspid Valve: The tricuspid valve is normal in structure. Tricuspid  valve regurgitation is not demonstrated. No evidence of tricuspid  stenosis.   Aortic Valve: Severe aortic valve stenosis. The aortic valve is abnormal.  There is severe calcifcation of the aortic valve. Aortic valve  regurgitation is trivial. Severe aortic stenosis is present. Aortic valve  mean gradient measures 40.0 mmHg. Aortic  valve peak gradient measures 64.3 mmHg. Aortic valve area, by VTI measures  1.07 cm.   Pulmonic Valve: The pulmonic valve was normal in structure. Pulmonic valve  regurgitation is trivial. No evidence of pulmonic stenosis.   Aorta: The aortic root is normal in size and structure.   Venous: The inferior vena cava is normal in size with greater than 50%  respiratory variability, suggesting right atrial pressure of 3 mmHg.   IAS/Shunts: No atrial level shunt detected by color flow Doppler.      LEFT VENTRICLE  PLAX 2D  LVIDd:         5.30 cm  LVIDs:         3.60 cm  LV PW:         1.30 cm  LV IVS:        1.40 cm  LVOT diam:     2.10 cm  LV SV:         83  LV SV Index:   44  LVOT Area:     3.46 cm     LV Volumes (MOD)  LV vol d, MOD A2C: 77.5 ml  LV vol d, MOD A4C: 88.3 ml  LV vol s, MOD A2C: 43.1 ml  LV vol s, MOD A4C: 42.0 ml  LV SV MOD A2C:     34.4 ml  LV SV MOD A4C:     88.3 ml  LV SV MOD BP:      41.5 ml   RIGHT VENTRICLE  RV Basal diam:  4.20 cm  TAPSE (M-mode): 2.3 cm   LEFT ATRIUM             Index       RIGHT ATRIUM           Index  LA diam:  4.70 cm 2.48 cm/m  RA Area:     15.00 cm  LA Vol (A2C):   78.1 ml 41.29 ml/m RA Volume:   37.40 ml  19.77 ml/m  LA Vol (A4C):   79.4 ml 41.98 ml/m  LA Biplane Vol: 82.2 ml 43.46 ml/m   AORTIC VALVE  AV Area (Vmax):    0.98 cm  AV Area (Vmean):   0.97 cm  AV Area (VTI):     1.07 cm  AV Vmax:           401.00 cm/s  AV  Vmean:          283.500 cm/s  AV VTI:            0.771 m  AV Peak Grad:      64.3 mmHg  AV Mean Grad:      40.0 mmHg  LVOT Vmax:         114.00 cm/s  LVOT Vmean:        79.100 cm/s  LVOT VTI:          0.239 m  LVOT/AV VTI ratio: 0.31     AORTA  Ao Root diam: 2.80 cm  Ao Asc diam:  3.00 cm   MITRAL VALVE  MV Area (PHT): 9.37 cm     SHUNTS  MV Area VTI:   2.56 cm     Systemic VTI:  0.24 m  MV Peak grad:  15.8 mmHg    Systemic Diam: 2.10 cm  MV Mean grad:  4.7 mmHg  MV Vmax:       1.99 m/s  MV Vmean:      129.0 cm/s  MV Decel Time: 81 msec  MV E velocity: 118.00 cm/s  MV A velocity: 182.00 cm/s  MV E/A ratio:  0.65   Echo 06/01/20: 1. Left ventricular ejection fraction, by estimation, is 60 to 65%. The  left ventricle has normal function. The left ventricle has no regional  wall motion abnormalities. There is moderate left ventricular hypertrophy.  Left ventricular diastolic  parameters are consistent with Grade I diastolic dysfunction (impaired  relaxation).   2. Right ventricular systolic function is normal. The right ventricular  size is normal. There is normal pulmonary artery systolic pressure.   3. Left atrial size was moderately dilated.   4. The mitral valve is grossly normal. Mild mitral valve regurgitation.   5. The aortic valve was not well visualized. Aortic valve regurgitation  is trivial.   6. The inferior vena cava is normal in size with greater than 50%  respiratory variability, suggesting right atrial pressure of 3 mmHg.   Recent Labs: 06/13/2020: Magnesium 2.2; TSH 22.202 06/15/2020: BUN 14; Creatinine, Ser 0.62; Hemoglobin 16.4; Platelets 332; Potassium 4.3; Sodium 135    Wt Readings from Last 3 Encounters:  06/15/20 174 lb (78.9 kg)  06/13/20 181 lb 9.6 oz (82.4 kg)  06/01/20 184 lb 4.9 oz (83.6 kg)     STS Risk Score: *** redo STS Risk of Mortality: 1.744% Renal Failure: 1.765% Permanent Stroke: 1.719% Prolonged Ventilation: 8.375% DSW  Infection: 0.133% Reoperation: 2.846% Morbidity or Mortality: 11.550% Short Length of Stay: 42.357% Long Length of Stay: 5.150%  Assessment and Plan:   1. Severe Aortic Valve Stenosis: She has severe, stage D aortic valve stenosis. Her echo from April 2022 is reviewed again and shows mean gradient over 40 mmHg. Echo from May 2022 with slightly lower AV gradients. I have personally reviewed the echo images. The aortic valve is  thickened, calcified with limited leaflet excursion. I think she would benefit from AVR. She would be a candidate for open surgical AVR or TAVR.     I have reviewed the natural history of aortic stenosis with the patient and their family members  who are present today. We have discussed the limitations of medical therapy and the poor prognosis associated with symptomatic aortic stenosis. We have reviewed potential treatment options, including palliative medical therapy, conventional surgical aortic valve replacement, and transcatheter aortic valve replacement. We discussed treatment options in the context of the patient's specific comorbid medical conditions.    She would like to continue with planning for TAVR. Cardiac cath has been completed. Risks and benefits of the valve procedure are reviewed with the patient. We will arrange a cardiac CT, CTA of the chest/abdomen and pelvis, PT assessment and she will then be referred to see Dr. Laneta Simmers with CT surgery.     Current medicines are reviewed at length with the patient today.  The patient does not have concerns regarding medicines.  The following changes have been made:  no change  Labs/ tests ordered today include:   No orders of the defined types were placed in this encounter.    Disposition:   F/U with the valve team    Signed, [name], MD 09/19/2020 2:57 PM    [facility] [address] [place], [place], [place]  [address] Phone: [phone]; Fax: [phone]

## 2020-09-20 ENCOUNTER — Ambulatory Visit: Payer: Medicare Other | Admitting: Cardiovascular Disease
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
## 2020-10-02 ENCOUNTER — Ambulatory Visit (INDEPENDENT_AMBULATORY_CARE_PROVIDER_SITE_OTHER): Payer: Medicare Other | Admitting: Cardiovascular Disease

## 2020-10-02 ENCOUNTER — Encounter: Payer: Self-pay | Admitting: Cardiovascular Disease

## 2020-10-02 ENCOUNTER — Other Ambulatory Visit: Payer: Self-pay

## 2020-10-02 VITALS — BP 130/78 | HR 92 | Wt 171.2 lb

## 2020-10-02 DIAGNOSIS — I350 Nonrheumatic aortic (valve) stenosis: Secondary | ICD-10-CM | POA: Diagnosis not present

## 2020-10-02 LAB — BASIC METABOLIC PANEL
BUN/Creatinine Ratio: 23 (ref 12–28)
BUN: 20 mg/dL (ref 8–27)
CO2: 23 mmol/L (ref 20–29)
Calcium: 9.7 mg/dL (ref 8.7–10.3)
Chloride: 91 mmol/L — ABNORMAL LOW (ref 96–106)
Creatinine, Ser: 0.87 mg/dL (ref 0.57–1.00)
Glucose: 418 mg/dL — ABNORMAL HIGH (ref 65–99)
Potassium: 5.5 mmol/L — ABNORMAL HIGH (ref 3.5–5.2)
Sodium: 130 mmol/L — ABNORMAL LOW (ref 134–144)
eGFR: 73 mL/min/{1.73_m2} (ref >59)

## 2020-10-02 NOTE — Patient Instructions (Signed)
Medication Instructions:  No changes *If you need a refill on your cardiac medications before your next appointment, please call your pharmacy*   Lab Work: BMET today

## 2020-10-03 ENCOUNTER — Other Ambulatory Visit: Payer: Self-pay

## 2020-10-03 DIAGNOSIS — I350 Nonrheumatic aortic (valve) stenosis: Secondary | ICD-10-CM

## 2020-10-03 DIAGNOSIS — E875 Hyperkalemia: Secondary | ICD-10-CM

## 2020-10-06 ENCOUNTER — Telehealth: Payer: Self-pay

## 2020-10-06 ENCOUNTER — Other Ambulatory Visit: Payer: Self-pay

## 2020-10-06 DIAGNOSIS — I350 Nonrheumatic aortic (valve) stenosis: Secondary | ICD-10-CM

## 2020-10-06 NOTE — Telephone Encounter (Signed)
I contacted the pt by phone to review TAVR testing schedule.  The pt requested that a letter be mailed to her home and also advised that I can contact her son, Kristina Montgomery, in regards to these appointments.  Instruction letter mailed and I left a message on [name]'s voicemail to contact me to discuss upcoming appointments.  I also reminded the pt that she was due for repeat lab work to check her potassium.  The pt states that she plans to go to [facility] this afternoon to have this blood drawn.

## 2020-10-10 LAB — BASIC METABOLIC PANEL
BUN/Creatinine Ratio: 17 (ref 12–28)
BUN: 17 mg/dL (ref 8–27)
CO2: 25 mmol/L (ref 20–29)
Calcium: 10.7 mg/dL — ABNORMAL HIGH (ref 8.7–10.3)
Chloride: 85 mmol/L — ABNORMAL LOW (ref 96–106)
Creatinine, Ser: 1.01 mg/dL — ABNORMAL HIGH (ref 0.57–1.00)
Glucose: 453 mg/dL — ABNORMAL HIGH (ref 65–99)
Potassium: 5 mmol/L (ref 3.5–5.2)
Sodium: 128 mmol/L — ABNORMAL LOW (ref 134–144)
eGFR: 61 mL/min/{1.73_m2} (ref >59)

## 2020-10-18 ENCOUNTER — Ambulatory Visit (HOSPITAL_COMMUNITY)
Admission: RE | Admit: 2020-10-18 | Discharge: 2020-10-18 | Disposition: A | Discharge status: Home / Self Care | Payer: Medicare Other | Source: Ambulatory Visit | Attending: Cardiovascular Disease | Admitting: Cardiovascular Disease

## 2020-10-18 ENCOUNTER — Other Ambulatory Visit: Payer: Self-pay

## 2020-10-18 DIAGNOSIS — I350 Nonrheumatic aortic (valve) stenosis: Secondary | ICD-10-CM

## 2020-10-18 MED ORDER — IOHEXOL 350 MG/ML SOLN
100.0000 mL | Freq: Once | INTRAVENOUS | Status: AC
  Administered 2020-10-18: 100 mL via INTRAVENOUS

## 2020-11-15 ENCOUNTER — Institutional Professional Consult (permissible substitution) (INDEPENDENT_AMBULATORY_CARE_PROVIDER_SITE_OTHER): Payer: Medicare Other | Admitting: Surgery

## 2020-11-15 ENCOUNTER — Ambulatory Visit: Payer: Medicare Other | Attending: Cardiovascular Disease | Admitting: Physical Therapy

## 2020-11-15 ENCOUNTER — Other Ambulatory Visit: Payer: Self-pay

## 2020-11-15 ENCOUNTER — Encounter: Payer: Self-pay | Admitting: Surgery

## 2020-11-15 VITALS — BP 157/74 | HR 102 | Resp 20 | Ht 65.0 in | Wt 172.0 lb

## 2020-11-15 DIAGNOSIS — F419 Anxiety disorder, unspecified: Secondary | ICD-10-CM | POA: Insufficient documentation

## 2020-11-15 DIAGNOSIS — I2510 Atherosclerotic heart disease of native coronary artery without angina pectoris: Secondary | ICD-10-CM | POA: Insufficient documentation

## 2020-11-15 DIAGNOSIS — R296 Repeated falls: Secondary | ICD-10-CM | POA: Insufficient documentation

## 2020-11-15 DIAGNOSIS — G2581 Restless legs syndrome: Secondary | ICD-10-CM | POA: Insufficient documentation

## 2020-11-15 DIAGNOSIS — E0500 Thyrotoxicosis with diffuse goiter without thyrotoxic crisis or storm: Secondary | ICD-10-CM | POA: Insufficient documentation

## 2020-11-15 DIAGNOSIS — I350 Nonrheumatic aortic (valve) stenosis: Secondary | ICD-10-CM

## 2020-11-15 DIAGNOSIS — L209 Atopic dermatitis, unspecified: Secondary | ICD-10-CM | POA: Insufficient documentation

## 2020-11-15 DIAGNOSIS — M76899 Other specified enthesopathies of unspecified lower limb, excluding foot: Secondary | ICD-10-CM | POA: Insufficient documentation

## 2020-11-15 DIAGNOSIS — I77811 Abdominal aortic ectasia: Secondary | ICD-10-CM | POA: Insufficient documentation

## 2020-11-15 DIAGNOSIS — I10 Essential (primary) hypertension: Secondary | ICD-10-CM | POA: Insufficient documentation

## 2020-11-15 DIAGNOSIS — E042 Nontoxic multinodular goiter: Secondary | ICD-10-CM | POA: Insufficient documentation

## 2020-11-15 DIAGNOSIS — M5136 Other intervertebral disc degeneration, lumbar region: Secondary | ICD-10-CM | POA: Insufficient documentation

## 2020-11-15 DIAGNOSIS — M62830 Muscle spasm of back: Secondary | ICD-10-CM | POA: Insufficient documentation

## 2020-11-15 DIAGNOSIS — Z5181 Encounter for therapeutic drug level monitoring: Secondary | ICD-10-CM | POA: Insufficient documentation

## 2020-11-15 DIAGNOSIS — D518 Other vitamin B12 deficiency anemias: Secondary | ICD-10-CM | POA: Insufficient documentation

## 2020-11-15 DIAGNOSIS — J449 Chronic obstructive pulmonary disease, unspecified: Secondary | ICD-10-CM | POA: Insufficient documentation

## 2020-11-15 DIAGNOSIS — E559 Vitamin D deficiency, unspecified: Secondary | ICD-10-CM | POA: Insufficient documentation

## 2020-11-15 DIAGNOSIS — I639 Cerebral infarction, unspecified: Secondary | ICD-10-CM | POA: Insufficient documentation

## 2020-11-15 DIAGNOSIS — I119 Hypertensive heart disease without heart failure: Secondary | ICD-10-CM | POA: Insufficient documentation

## 2020-11-15 DIAGNOSIS — K219 Gastro-esophageal reflux disease without esophagitis: Secondary | ICD-10-CM | POA: Insufficient documentation

## 2020-11-15 DIAGNOSIS — F329 Major depressive disorder, single episode, unspecified: Secondary | ICD-10-CM | POA: Insufficient documentation

## 2020-11-15 DIAGNOSIS — F330 Major depressive disorder, recurrent, mild: Secondary | ICD-10-CM | POA: Insufficient documentation

## 2020-11-15 DIAGNOSIS — B0229 Other postherpetic nervous system involvement: Secondary | ICD-10-CM | POA: Insufficient documentation

## 2020-11-15 DIAGNOSIS — M150 Primary generalized (osteo)arthritis: Secondary | ICD-10-CM | POA: Insufficient documentation

## 2020-11-15 DIAGNOSIS — F3132 Bipolar disorder, current episode depressed, moderate: Secondary | ICD-10-CM | POA: Insufficient documentation

## 2020-11-15 DIAGNOSIS — G4733 Obstructive sleep apnea (adult) (pediatric): Secondary | ICD-10-CM | POA: Insufficient documentation

## 2020-11-15 DIAGNOSIS — E782 Mixed hyperlipidemia: Secondary | ICD-10-CM | POA: Insufficient documentation

## 2020-11-15 DIAGNOSIS — I359 Nonrheumatic aortic valve disorder, unspecified: Secondary | ICD-10-CM | POA: Insufficient documentation

## 2020-11-15 DIAGNOSIS — E1165 Type 2 diabetes mellitus with hyperglycemia: Secondary | ICD-10-CM | POA: Insufficient documentation

## 2020-11-15 DIAGNOSIS — N183 Chronic kidney disease, stage 3 unspecified: Secondary | ICD-10-CM | POA: Insufficient documentation

## 2020-11-15 DIAGNOSIS — G4700 Insomnia, unspecified: Secondary | ICD-10-CM | POA: Insufficient documentation

## 2020-11-15 DIAGNOSIS — G894 Chronic pain syndrome: Secondary | ICD-10-CM | POA: Insufficient documentation

## 2020-11-15 NOTE — Progress Notes (Signed)
Patient ID: Kristina Montgomery, female   DOB: 04-11-1955, 65 y.o.   MRN: [id number]  [facility]         [address]       [place] [address]             [phone]          CARDIOTHORACIC SURGERY CONSULTATION REPORT  PCP is Hague, Myrene Galas, MD Referring Provider is [name], MD Primary Cardiologist is Parke Poisson, MD  Reason for consultation:  Severe aortic stenosis  HPI:  The patient is a 65 year old woman with a history of type 2 diabetes, hypertension, hyperlipidemia, ongoing 1 pack/day smoking with asthma/COPD, fibromyalgia, anxiety/depression, recent stroke, mild coronary artery disease, and severe aortic stenosis who was referred for consideration of TAVR.  She presented with progressive exertional fatigue and shortness of breath as well as chest pressure and dizziness.  She was seen by Dr. Jacques Navy in April 2022 and echocardiogram at that time showed severe aortic stenosis with a mean gradient of 40 mmHg and a peak gradient of 64 mmHg.  Aortic valve area was 0.9 cm with a dimensionless index of 0.31.  Left ventricular ejection fraction was 60 to 65%.  She was referred to Dr. Clifton James and was set up for cardiac catheterization but when she came in for cath she was found to have altered mental status and was diagnosed with an acute stroke.  This was a cortical/subcortical infarct involving the left parietal lobe measuring 3 x 3 cm.  There was some developing encephalomalacia at the site and this infarct was felt to be subacute.  There were 2 acute/early subacute infarcts within the posterior left frontoparietal subcortical white matter.  Carotid Dopplers showed mild nonobstructive disease bilaterally.  She was discharged on aspirin and Plavix but did not start taking the Plavix and reported missing doses of her aspirin.  She was seen back in the cardiology office having chest discomfort and was  admitted.  She underwent cardiac catheterization on 06/14/2020 showing mild nonobstructive coronary disease.  The mean gradient across the aortic valve was 28 mmHg.  There was some difficulty contacting her to complete her TAVR work-up but this was eventually completed in September.  She continues to have shortness of breath and fatigue with exertion as well as some dizziness and lower extremity edema.  She continues to smoke about 1 pack of cigarettes per day.  She lives alone in [facility] and has been on disability for 20 years due to depression.  She has some residual right leg weakness from her stroke and some difficulty finding the right words at times.  Past Medical History:  Diagnosis Date   Anxiety    Aortic stenosis    Asthma    Chest pain    COPD (chronic obstructive pulmonary disease) (HCC)    Coronary artery disease    CVA (cerebral vascular accident) (HCC)    Depression    Diabetes mellitus without complication (HCC)    Dizziness    Emphysema of lung (HCC)    Fatigue    Fever    Fibromyalgia    Heart murmur    Hypertension    Mixed hyperlipidemia    Muscle pain    Rash    foot   Swelling     Past Surgical History:  Procedure Laterality Date   CHOLECYSTECTOMY     RIGHT/LEFT HEART CATH AND CORONARY ANGIOGRAPHY N/A 06/14/2020  Procedure: RIGHT/LEFT HEART CATH AND CORONARY ANGIOGRAPHY;  Surgeon: Kathleene Hazel, MD;  Location: [facility];  Service: Cardiovascular;  Laterality: N/A;    Family History  Problem Relation Age of Onset   Heart disease Mother    Cancer Father     Social History   Socioeconomic History   Marital status: Widowed    Spouse name: Not on file   Number of children: 4   Years of education: Not on file   Highest education level: Not on file  Occupational History   Occupation: On disability for 20 years  Tobacco Use   Smoking status: Every Day    Packs/day: 1.00    Years: 35.00    Pack years: 35.00     Types: Cigarettes   Smokeless tobacco: Never  Vaping Use   Vaping Use: Never used  Substance and Sexual Activity   Alcohol use: No   Drug use: No   Sexual activity: Not on file  Other Topics Concern   Not on file  Social History Narrative   Not on file   Social Determinants of Health   Financial Resource Strain: Not on file  Food Insecurity: Not on file  Transportation Needs: Not on file  Physical Activity: Not on file  Stress: Not on file  Social Connections: Not on file  Intimate Partner Violence: Not on file    Prior to Admission medications   Medication Sig Start Date End Date Taking? Authorizing Provider  amitriptyline (ELAVIL) 50 MG tablet Take 50 mg by mouth at bedtime.   Yes [provider]  amLODipine (NORVASC) 10 MG tablet Take 10 mg by mouth daily.   Yes [provider]  desvenlafaxine (PRISTIQ) 50 MG 24 hr tablet Take 50 mg by mouth daily.   Yes [provider]  glimepiride (AMARYL) 4 MG tablet Take 4 mg by mouth daily with breakfast.   Yes [provider]  levothyroxine (SYNTHROID) 50 MCG tablet Take 50 mcg by mouth daily before breakfast.   Yes [provider]  metoprolol tartrate (LOPRESSOR) 25 MG tablet Take 1 tablet (25 mg total) by mouth 2 (two) times daily. 06/15/20  Yes Laverda Page B, NP  ondansetron (ZOFRAN-ODT) 8 MG disintegrating tablet Take 8 mg by mouth every 8 (eight) hours as needed for nausea or vomiting.   Yes [provider]  oxyCODONE 30 MG 12 hr tablet Take 1 tablet by mouth every 12 (twelve) hours.   Yes [provider]  rosuvastatin (CRESTOR) 40 MG tablet Take 40 mg by mouth daily.   Yes [provider]  sitaGLIPtin (JANUVIA) 50 MG tablet Take 50 mg by mouth daily.   Yes [provider]    Current Outpatient Medications  Medication Sig Dispense Refill   amitriptyline (ELAVIL) 50 MG tablet Take 50 mg by mouth at bedtime.     amLODipine (NORVASC) 10 MG tablet  Take 10 mg by mouth daily.     desvenlafaxine (PRISTIQ) 50 MG 24 hr tablet Take 50 mg by mouth daily.     glimepiride (AMARYL) 4 MG tablet Take 4 mg by mouth daily with breakfast.     levothyroxine (SYNTHROID) 50 MCG tablet Take 50 mcg by mouth daily before breakfast.     metoprolol tartrate (LOPRESSOR) 25 MG tablet Take 1 tablet (25 mg total) by mouth 2 (two) times daily. 60 tablet 1   ondansetron (ZOFRAN-ODT) 8 MG disintegrating tablet Take 8 mg by mouth every 8 (eight) hours as needed  for nausea or vomiting.     oxyCODONE 30 MG 12 hr tablet Take 1 tablet by mouth every 12 (twelve) hours.     rosuvastatin (CRESTOR) 40 MG tablet Take 40 mg by mouth daily.     sitaGLIPtin (JANUVIA) 50 MG tablet Take 50 mg by mouth daily.     No current facility-administered medications for this visit.    Allergies  Allergen Reactions   Codeine     Unknown      Review of Systems:   General:  normal appetite, + decreased  energy, no weight gain, no weight loss, no fever  Cardiac:  + chest pain with exertion, no chest pain at rest, +SOB with mild exertion, no resting SOB, no PND, no orthopnea, no palpitations, no arrhythmia, no atrial fibrillation, + LE edema, + dizzy spells, no syncope  Respiratory:  + exertional shortness of breath, no home oxygen, no productive cough, no dry cough, no bronchitis, + wheezing, no hemoptysis, + asthma, no pain with inspiration or cough, no sleep apnea, no CPAP at night  GI:   no difficulty swallowing, no reflux, no frequent heartburn, no hiatal hernia, no abdominal pain, no constipation, no diarrhea, no hematochezia, no hematemesis, no melena  GU:   no dysuria,  + frequency, no urinary tract infection, no hematuria, no kidney stones, no kidney disease  Vascular:  no pain suggestive of claudication, no pain in feet, no leg cramps, no varicose veins, no DVT, no non-healing foot ulcer  Neuro:   + stroke, no TIA's, no seizures, no headaches, no temporary blindness one eye,  no  slurred speech, no peripheral neuropathy, no chronic pain, + instability of gait, + memory/cognitive dysfunction  Musculoskeletal: no arthritis, no joint swelling, no myalgias, + difficulty walking, + decreased mobility   Skin:   no rash, no itching, no skin infections, no pressure sores or ulcerations  Psych:   + anxiety, + depression, + nervousness, no unusual recent stress  Eyes:   no blurry vision, no floaters, no recent vision changes, no glasses or contacts  ENT:   no hearing loss, no loose or painful teeth, +upper dentures and no teeth on bottom. Does not see a dentist.  Hematologic:  no easy bruising, no abnormal bleeding, no clotting disorder, no frequent epistaxis  Endocrine:  + diabetes, does not check CBG's at home     Physical Exam:   BP (!) 157/74   Pulse (!) 102   Resp 20   Ht  (1.651 m)   Wt 172 lb (78 kg)   SpO2 91% Comment: RA  BMI 28.62 kg/m   General:  Looks older than stated age,  well-appearing  HEENT:  Unremarkable, NCAT, PERLA, EOMI  Neck:   no JVD, + bruits or transmitted murmur bilaterally, no adenopathy   Chest:   clear to auscultation, symmetrical breath sounds, no wheezes, no rhonchi   CV:   RRR, 3/6 systolic murmur RSB, no diastolic murmur  Abdomen:  soft, non-tender, no masses   Extremities:  warm, well-perfused, pulses not palpable at ankle, trace lower extremity edema  Rectal/GU  Deferred  Neuro:   Grossly non-focal and symmetrical throughout  Skin:   Clean and dry, no rashes, no breakdown  Diagnostic Tests:      ECHOCARDIOGRAM REPORT         Patient Name:   GENIECE AKERS Date of Exam: 06/01/2020  Medical Rec #:  [id number]       Height:  65.0 in  Accession #:    [id number]      Weight:       184.3 lb  Date of Birth:  1955-07-25       BSA:          1.911 m  Patient Age:    65 years        BP:           114/68 mmHg  Patient Gender: F               HR:           85 bpm.  Exam Location:  Inpatient   Procedure: 2D Echo, Cardiac  Doppler and Color Doppler   Indications:    CVA     History:        Patient has prior history of Echocardiogram examinations,  most                  recent 05/03/2020. CAD, COPD, Signs/Symptoms:Murmur; Risk                  Factors:Dyslipidemia, Diabetes and Hypertension.     Sonographer:    Neomia Dear RDCS  Referring Phys: [id number] RONDELL A SMITH      Sonographer Comments: Technically challenging study due to limited  acoustic windows and patient is morbidly obese.  IMPRESSIONS     1. Left ventricular ejection fraction, by estimation, is 60 to 65%. The  left ventricle has normal function. The left ventricle has no regional  wall motion abnormalities. There is moderate left ventricular hypertrophy.  Left ventricular diastolic  parameters are consistent with Grade I diastolic dysfunction (impaired  relaxation).   2. Right ventricular systolic function is normal. The right ventricular  size is normal. There is normal pulmonary artery systolic pressure.   3. Left atrial size was moderately dilated.   4. The mitral valve is grossly normal. Mild mitral valve regurgitation.   5. The aortic valve was not well visualized. Aortic valve regurgitation  is trivial.   6. The inferior vena cava is normal in size with greater than 50%  respiratory variability, suggesting right atrial pressure of 3 mmHg.   FINDINGS   Left Ventricle: Left ventricular ejection fraction, by estimation, is 60  to 65%. The left ventricle has normal function. The left ventricle has no  regional wall motion abnormalities. The left ventricular internal cavity  size was normal in size. There is   moderate left ventricular hypertrophy. Left ventricular diastolic  parameters are consistent with Grade I diastolic dysfunction (impaired  relaxation).   Right Ventricle: The right ventricular size is normal. No increase in  right ventricular wall thickness. Right ventricular systolic function is  normal. There is normal  pulmonary artery systolic pressure. The tricuspid  regurgitant velocity is 2.62 m/s, and   with an assumed right atrial pressure of 3 mmHg, the estimated right  ventricular systolic pressure is 30.5 mmHg.   Left Atrium: Left atrial size was moderately dilated.   Right Atrium: Right atrial size was normal in size.   Pericardium: There is no evidence of pericardial effusion.   Mitral Valve: The mitral valve is grossly normal. Mild mitral valve  regurgitation. MV peak gradient, 12.4 mmHg. The mean mitral valve gradient  is 5.0 mmHg.   Tricuspid Valve: The tricuspid valve is not well visualized. Tricuspid  valve regurgitation is not demonstrated.   Aortic Valve: The aortic valve was not well visualized. Aortic valve  regurgitation is  trivial. Aortic regurgitation PHT measures 279 msec.  Aortic valve mean gradient measures 29.3 mmHg. Aortic valve peak gradient  measures 53.8 mmHg. Aortic valve area, by   VTI measures 1.29 cm.   Pulmonic Valve: The pulmonic valve was not well visualized. Pulmonic valve  regurgitation is not visualized.   Aorta: The aortic root and ascending aorta are structurally normal, with  no evidence of dilitation.   Venous: The inferior vena cava is normal in size with greater than 50%  respiratory variability, suggesting right atrial pressure of 3 mmHg.   IAS/Shunts: The atrial septum is grossly normal.      LEFT VENTRICLE  PLAX 2D  LVIDd:         4.90 cm     Diastology  LVIDs:         3.30 cm     LV e' medial:    4.38 cm/s  LV PW:         1.70 cm     LV E/e' medial:  23.1  LV IVS:        1.40 cm     LV e' lateral:   4.38 cm/s  LVOT diam:     2.40 cm     LV E/e' lateral: 23.1  LV SV:         95  LV SV Index:   50  LVOT Area:     4.52 cm     LV Volumes (MOD)  LV vol d, MOD A2C: 46.0 ml  LV vol d, MOD A4C: 94.4 ml  LV vol s, MOD A2C: 30.9 ml  LV vol s, MOD A4C: 36.8 ml  LV SV MOD A2C:     15.1 ml  LV SV MOD A4C:     94.4 ml  LV SV MOD BP:       31.6 ml   RIGHT VENTRICLE  RV S prime:     14.70 cm/s  TAPSE (M-mode): 1.6 cm   LEFT ATRIUM             Index       RIGHT ATRIUM           Index  LA diam:        4.10 cm 2.15 cm/m  RA Area:     12.20 cm  LA Vol (A2C):   91.8 ml 48.05 ml/m RA Volume:   24.70 ml  12.93 ml/m  LA Vol (A4C):   60.7 ml 31.77 ml/m  LA Biplane Vol: 76.8 ml 40.20 ml/m   AORTIC VALVE                    PULMONIC VALVE  AV Area (Vmax):    1.41 cm     PV Vmax:       2.10 m/s  AV Area (Vmean):   1.27 cm     PV Vmean:      220.000 cm/s  AV Area (VTI):     1.29 cm     PV VTI:        0.394 m  AV Vmax:           366.67 cm/s  PV Peak grad:  17.6 mmHg  AV Vmean:          246.000 cm/s PV Mean grad:  21.0 mmHg  AV VTI:            0.739 m  AV Peak Grad:      53.8 mmHg  AV Mean Grad:  29.3 mmHg  LVOT Vmax:         114.00 cm/s  LVOT Vmean:        69.300 cm/s  LVOT VTI:          0.211 m  LVOT/AV VTI ratio: 0.29  AI PHT:            279 msec     AORTA  Ao Root diam: 2.80 cm  Ao Asc diam:  3.10 cm   MITRAL VALVE                TRICUSPID VALVE  MV Area (PHT): 4.21 cm     TR Peak grad:   27.5 mmHg  MV Area VTI:   2.90 cm     TR Vmax:        262.00 cm/s  MV Peak grad:  12.4 mmHg  MV Mean grad:  5.0 mmHg     SHUNTS  MV Vmax:       1.76 m/s     Systemic VTI:  0.21 m  MV Vmean:      99.1 cm/s    Systemic Diam: 2.40 cm  MV Decel Time: 180 msec  MR Peak grad: 93.3 mmHg  MR Mean grad: 62.0 mmHg  MR Vmax:      483.00 cm/s  MR Vmean:     376.0 cm/s  MV E velocity: 101.00 cm/s  MV A velocity: 162.00 cm/s  MV E/A ratio:  0.62   Kristeen Miss MD  Electronically signed by Kristeen Miss MD  Signature Date/Time: 06/01/2020/4:42:14 PM         Final     Physicians Panel Physicians Referring Physician Case Authorizing Physician  Kathleene Hazel, MD (Primary)    Procedures RIGHT/LEFT HEART CATH AND CORONARY ANGIOGRAPHY  Conclusion Prox RCA lesion is 30% stenosed.  Mid RCA lesion is 30% stenosed.   2nd Mrg lesion is 30% stenosed.  Ramus lesion is 30% stenosed. 1. Mild non-obstructive CAD  2. Severe aortic stenosis by echo (by cath mean gradient 27.7 mmHg, peak to peak gradient 28 mmHg)  Recommendations: Continue workup for TAVR. I would still like to delay TAVR for at least 8 weeks post CVA if possible. We can arrange her CT scans next.  Indications Chest pain of uncertain etiology [R07.9 (ICD-10-CM)]  Severe aortic stenosis [I35.0 (ICD-10-CM)]  Procedural Details Technical Details Indication: Severe aortic stenosis. Admitted with chest pain.   Procedure: The risks, benefits, complications, treatment options, and expected outcomes were discussed with the patient. The patient and/or family concurred with the proposed plan, giving informed consent. The patient was brought to the cath lab after IV hydration was given. The patient was sedated with Versed and Fentanyl. The IV catheter present in the right antecubital vein was changed for a 7 [place] sheath. Right heart catheterization performed with a balloon tipped catheter. The right groin was prepped and draped in the usual manner. Using the modified Seldinger access technique, a 5 French sheath was placed in the right femoral artery using u/s guidance. Standard diagnostic catheters were used to perform selective coronary angiography. The aortic valve was crossed with an AL-2 catheter and a straight wire.   There were no immediate complications. The patient was taken to the recovery area in stable condition.   Estimated blood loss <50 mL.   During this procedure medications were administered to achieve and maintain moderate conscious sedation while the patient's heart rate, blood pressure, and oxygen saturation were continuously monitored and I was  present face-to-face 100% of this time.  Medications (Filter: Administrations occurring from 1637 to 1749 on 06/14/20)  important Continuous medications are totaled by the amount administered  until 06/14/20 1749.  fentaNYL (SUBLIMAZE) injection (mcg) Total dose: 50 mcg  Date/Time Rate/Dose/Volume Action    06/14/20 1642 25 mcg Given   1706 25 mcg Given   midazolam (VERSED) injection (mg) Total dose: 2 mg  Date/Time Rate/Dose/Volume Action    06/14/20 1642 1 mg Given   1645 1 mg Given   lidocaine (PF) (XYLOCAINE) 1 % injection (mL) Total volume: 4 mL  Date/Time Rate/Dose/Volume Action    06/14/20 1653 2 mL Given   1655 2 mL Given   Heparin (Porcine) in NaCl 1000-0.9 UT/500ML-% SOLN (mL) Total volume: 1,000 mL  Date/Time Rate/Dose/Volume Action    06/14/20 1653 500 mL Given   1653 500 mL Given   iohexol (OMNIPAQUE) 350 MG/ML injection (mL) Total volume: 50 mL  Date/Time Rate/Dose/Volume Action    06/14/20 1741 50 mL Given   fentaNYL (SUBLIMAZE) injection (mcg) Total dose: 25 mcg  Date/Time Rate/Dose/Volume Action    06/14/20 1730 25 mcg Given   insulin aspart (novoLOG) injection 0-15 Units (Units) Total dose: Cannot be calculated* Dosing weight: 79.3  *Administration dose not documented  Date/Time Rate/Dose/Volume Action    06/14/20 1700 *Not included in total Automatically Held   Sedation Time Sedation Time Physician-1: 58 minutes 35 seconds  Contrast Medication Name Total Dose  iohexol (OMNIPAQUE) 350 MG/ML injection 50 mL  Radiation/Fluoro Fluoro time: 11.9 (min)  DAP: 44183 (mGycm2)  Cumulative Air Kerma: 788 (mGy)  Complications Complications documented before study signed (06/14/2020 5:55 PM)  RIGHT/LEFT HEART CATH AND CORONARY ANGIOGRAPHY  None Documented by Kathleene Hazel, MD 06/14/2020 5:53 PM  Date Found: 06/14/2020  Time Range: Intraprocedure    Coronary Findings Diagnostic Dominance: Right  Left Anterior Descending  Vessel is large.   Ramus Intermedius  Ramus lesion is 30% stenosed.   Left Circumflex   Second Obtuse Marginal Branch  2nd Mrg lesion is 30% stenosed.   Right Coronary Artery  Vessel is large.  Prox RCA  lesion is 30% stenosed.  Mid RCA lesion is 30% stenosed.  Intervention No interventions have been documented.  Coronary Diagrams Diagnostic Dominance: Right  &&&&&&  Intervention Implants  Vascular Products  Closure Mynx Control 30f - [id number] - Implanted  Inventory item: CLOSURE [facility] CONTROL 71F Model/Cat number: [id number]  Manufacturer: [employer] Lot number: [id number]  Device identifier: [id number] Device identifier type: GS1  GUDID Information   Request status Successful     Brand name: MYNX CONTROL Version/Model: [id number]   Company name: [employer] MRI safety info as of 06/14/20: MR Safe   Contains dry or latex rubber: No     GMDN P.T. name: Wound hydrogel dressing, non-antimicrobial     As of 06/14/2020    Status: Implanted       Syngo Images Show images for CARDIAC CATHETERIZATION  Images on Long Term Storage Show images for Ranasia, Massarelli to Procedure Log   Procedure Log  Hemo Data Flowsheet Row Most Recent Value  Fick Cardiac Output 7.06 L/min  Fick Cardiac Output Index 3.75 (L/min)/BSA  Aortic Mean Gradient 27.7 mmHg  Aortic Peak Gradient 28 mmHg  Aortic Valve Area 1.31  Aortic Value Area Index 0.69 cm2/BSA  RA A Wave 5 mmHg  RA V Wave 3 mmHg  RA Mean 2 mmHg  RV Systolic Pressure 33 mmHg  RV Diastolic Pressure 2 mmHg  RV EDP 4 mmHg  PA Systolic Pressure 33 mmHg  PA Diastolic Pressure 13 mmHg  PA Mean 23 mmHg  PW A Wave 12 mmHg  PW V Wave 14 mmHg  PW Mean 11 mmHg  AO Systolic Pressure 149 mmHg  AO Diastolic Pressure 65 mmHg  AO Mean 97 mmHg  LV Systolic Pressure 175 mmHg  LV Diastolic Pressure 6 mmHg  LV EDP 8 mmHg  AOp Systolic Pressure 149 mmHg  AOp Diastolic Pressure 73 mmHg  AOp Mean Pressure 104 mmHg  LVp Systolic Pressure 177 mmHg  LVp Diastolic Pressure 7 mmHg  LVp EDP Pressure 10 mmHg  QP/QS 1  TPVR Index 6.14 HRUI  TSVR Index 25.9 HRUI  PVR SVR Ratio 0.13  TPVR/TSVR Ratio 0.24    ADDENDUM REPORT: 10/21/2020  08:17   CLINICAL DATA:  Pre-op transcatheter aortic valve replacement (TAVR)   EXAM: Cardiac TAVR CT   TECHNIQUE: The patient was scanned on a Siemens Force 192 slice scanner. A 120 kV retrospective scan was triggered in the descending thoracic aorta at 111 HU's. Gantry rotation speed was 270 msecs and collimation was .9 mm. The 3D data set was reconstructed in 5% intervals of the R-R cycle. Systolic and diastolic phases were analyzed on a dedicated work station using MPR, MIP and VRT modes. The patient received OMNIPAQUE IOHEXOL 350 MG/ML SOLN of contrast.   FINDINGS: Aortic Valve: Tricuspid aortic valve. Severely reduced cusp separation. Severely thickened, severely calcified aortic valve cusps.   AV calcium score: 2088   Virtual Basal Annulus Measurements:   Maximum/Minimum Diameter: 28.4 x 20.8 mm   Perimeter:  77 mm   Area:  446 mm2   No significant LVOT calcifications.   Based on these measurements, the annulus would be suitable for a 26 mm Sapien 3 valve.   Sinus of Valsalva Measurements:   Non-coronary:  30 mm   Right - coronary:  28 mm   Left - coronary:  30 mm   Sinus of Valsalva Height:   Left: 18.8 mm   Right: 19.5 mm   Aorta: Severe aortic atherosclerosis. Conventional 3 vessel branch pattern of aortic arch.   Sinotubular Junction:  25 mm   Ascending Thoracic Aorta:  30 mm   Aortic Arch:  26 mm   Descending Thoracic Aorta:  27 mm   Coronary Artery Height above Annulus:   Left Main: 13.7 mm   Right Coronary: 15.0 mm   Coronary Arteries: Normal coronary origin. Right dominance. The study was performed without use of NTG and insufficient for plaque evaluation.   Optimum Fluoroscopic Angle for Delivery: LAO 2, CAU 3   Mild mitral annular calcification.   No left atrial appendage thrombus.   IMPRESSION: 1. Tricuspid aortic valve. Severely reduced cusp separation. Severely thickened, severely calcified aortic valve cusps.    2.  AV calcium score: 2088   3. Annulus area: 446 mm2, no significant LVOT calcifications. The annulus would be suitable for a 26 mm Sapien 3 valve.   4.  Sufficient coronary artery heights.   5.  Severe aortic atherosclerosis in the descending thoracic aorta.   6. Optimum Fluoroscopic Angle for Delivery: LAO 2, CAU 3     Electronically Signed   By: Weston Brass M.D.   On: 10/21/2020 08:17    Addended by Parke Poisson, MD on 10/21/2020  8:20 AM   Study Result  Narrative & Impression  EXAM: OVER-READ INTERPRETATION  CT CHEST  The following report is an over-read performed by radiologist Dr. Trudie Reed of [facility] on 10/18/2020. This over-read does not include interpretation of cardiac or coronary anatomy or pathology. The coronary calcium score/coronary CTA interpretation by the cardiologist is attached.   COMPARISON:  Chest CT 11/05/2018.   FINDINGS: Extracardiac findings will be described separately under dictation for contemporaneously obtained CTA chest, abdomen and pelvis.   IMPRESSION: Please see separate dictation for contemporaneously obtained CTA chest, abdomen and pelvis dated 10/18/2020 for full description of relevant extracardiac findings.   Electronically Signed: By: Trudie Reed M.D. On: 10/18/2020 12:07    CLINICAL DATA: 65 year old female with history of severe aortic  stenosis. Preprocedural study prior to potential transcatheter  aortic valve replacement (TAVR) procedure.  EXAM:  CT ANGIOGRAPHY CHEST, ABDOMEN AND PELVIS  TECHNIQUE:  Multidetector CT imaging through the chest, abdomen and pelvis was  performed using the standard protocol during bolus administration of  intravenous contrast. Multiplanar reconstructed images and MIPs were  obtained and reviewed to evaluate the vascular anatomy.  CONTRAST: OMNIPAQUE IOHEXOL 350 MG/ML SOLN  COMPARISON: Chest CT 11/05/2018. No prior CT of the abdomen and   pelvis.  FINDINGS:  CTA CHEST FINDINGS  Cardiovascular: Heart size is enlarged with concentric left  ventricular hypertrophy. There is no significant pericardial fluid,  thickening or pericardial calcification. There is aortic  atherosclerosis, as well as atherosclerosis of the great vessels of  the mediastinum and the coronary arteries, including calcified  atherosclerotic plaque in the left main, left anterior descending,  left circumflex and right coronary arteries. Severe thickening  calcification of the aortic valve.  Mediastinum/Lymph Nodes: No pathologically enlarged mediastinal or  hilar lymph nodes. Esophagus is unremarkable in appearance. No  axillary lymphadenopathy.  Lungs/Pleura: No suspicious appearing pulmonary nodules or masses  are noted. No acute consolidative airspace disease. No pleural  effusions.  Musculoskeletal/Soft Tissues: There are no aggressive appearing  lytic or blastic lesions noted in the visualized portions of the  skeleton.  CTA ABDOMEN AND PELVIS FINDINGS  Hepatobiliary: No suspicious cystic or solid hepatic lesions. No  intra or extrahepatic biliary ductal dilatation. Status post  cholecystectomy.  Pancreas: No pancreatic mass. No pancreatic ductal dilatation. No  pancreatic or peripancreatic fluid collections or inflammatory  changes.  Spleen: Unremarkable.  Adrenals/Urinary Tract: Mild multifocal cortical thinning in the  kidneys bilaterally. Exophytic 1.6 cm low-attenuation lesion in the  lateral aspect of the interpolar region of the left kidney,  compatible with a simple cyst. Several subcentimeter low-attenuation  lesions in both kidneys, too small to characterize, but  statistically likely to represent tiny cysts. No  hydroureteronephrosis. Urinary bladder is normal in appearance.  Bilateral adrenal glands are normal in appearance.  Stomach/Bowel: The appearance of the stomach is normal. There is no  pathologic dilatation of small  bowel or colon. Normal appendix.  Vascular/Lymphatic: Severe atherosclerosis in the abdominal aorta  and pelvic vasculature, with vascular findings and measurements  pertinent to potential TAVR procedure, as detailed below. No  aneurysm or dissection noted in the abdominal or pelvic vasculature.  No lymphadenopathy noted in the abdomen or pelvis.  Reproductive: Uterus and ovaries are unremarkable in appearance.  Other: No significant volume of ascites. No pneumoperitoneum.  Musculoskeletal: There are no aggressive appearing lytic or blastic  lesions noted in the visualized portions of the skeleton.  VASCULAR MEASUREMENTS PERTINENT TO TAVR:  AORTA:  Minimal Aortic Diameter-6 x 5 mm (axial image 158 of series 16)  Severity of Aortic  Calcification-severe  RIGHT PELVIS:  Right Common Iliac Artery -  Minimal Diameter-5.9 x 3.6 mm  Tortuosity-mild  Calcification-severe  Right External Iliac Artery -  Minimal Diameter-5.7 x 2.6 mm  Tortuosity - mild  Calcification-mild  Right Common Femoral Artery -  Minimal Diameter-5.0 x 4.5 mm  Tortuosity - mild  Calcification-mild  LEFT PELVIS:  Left Common Iliac Artery -  Minimal Diameter-6.1 x 3.3 mm  Tortuosity - mild  Calcification-severe  Left External Iliac Artery -  Minimal Diameter-5.3 x 4.2 mm  Tortuosity - mild  Calcification-mild  Left Common Femoral Artery -  Minimal Diameter-5.4 x 4.3 mm  Tortuosity - mild  Calcification-mild  Review of the MIP images confirms the above findings.  IMPRESSION:  1. Vascular findings and measurements pertinent to potential TAVR  procedure, as detailed above.  2. Severe thickening calcification of the aortic valve, compatible  with reported clinical history of severe aortic stenosis.  3. Aortic atherosclerosis, in addition to left main and 3 vessel  coronary artery disease.  4. Cardiomegaly with concentric left ventricular hypertrophy.  5. Additional incidental findings, as above.   Electronically Signed  By: Trudie Reed M.D.  On: 10/18/2020 12:32   STS Risk Score: (updated 10/02/20) AVR alone Risk of Mortality: 1.971% Renal Failure: 0.550% Permanent Stroke: 1.646% Prolonged Ventilation: 7.756% DSW Infection: 0.076% Reoperation: 2.530% Morbidity or Mortality: 11.985% Short Length of Stay: 41.135% Long Length of Stay: 4.234%   Impression:  This 65 year old woman has stage D, severe, symptomatic aortic stenosis with New York Heart Association class III symptoms of exertional fatigue and shortness of breath as well as substernal chest pressure consistent with chronic diastolic congestive heart failure.  She has also been having episodes of dizziness as well as some lower extremity edema.  I have personally reviewed her 2D echocardiograms, cardiac catheterization, and CTA studies.  Her echo in April 2022 showed a severely calcified aortic valve with a mean gradient of 40 mmHg consistent with severe aortic stenosis.  A repeat echo in May 2022 showed a lower mean gradient of 29 mmHg.  Her aortic valve is severely calcified with restricted mobility and certainly looks like a severely stenotic valve.  Cardiac catheterization showed mild nonobstructive coronary disease.  The mean gradient across aortic valve was measured at 28 mmHg with a peak to peak gradient of 28 mmHg.  I agree that aortic valve replacement is indicated in this patient with a severely calcified aortic valve with restricted mobility and progressive symptoms consistent with severe aortic stenosis.  I think she would be a high risk candidate for open surgical aortic valve replacement due to her multiple comorbid risk factors as well as previous strokes and ongoing 1 pack/day smoking for the past 45 years with probable significant COPD.  I think transcatheter aortic valve replacement would be a reasonable alternative for treating her.  Her gated cardiac CTA shows anatomy suitable for transcatheter aortic  valve replacement using a 23 mm SAPIEN 3 valve.  Her abdominal and pelvic CTA shows severe aortoiliac arterial disease that would preclude transfemoral insertion.  I think her left subclavian/axillary arteries are large enough caliber for insertion with minimal disease.   The patient was counseled at length regarding treatment alternatives for management of severe symptomatic aortic stenosis. The risks and benefits of surgical intervention has been discussed in detail. Long-term prognosis with medical therapy was discussed. Alternative approaches such as conventional surgical aortic valve replacement, transcatheter aortic valve replacement, and palliative medical therapy were compared and contrasted at length.  This discussion was placed in the context of the patient's own specific clinical presentation and past medical history. All of their questions have been addressed.   Following the decision to proceed with transcatheter aortic valve replacement, a discussion was held regarding what types of management strategies would be attempted intraoperatively in the event of life-threatening complications, including whether or not the patient would be considered a candidate for the use of cardiopulmonary bypass and/or conversion to open sternotomy for attempted surgical intervention.  I think she would be a candidate for emergent sternotomy to manage any intraoperative complications although she would certainly be at high risk.  The patient is aware of the fact that transient use of cardiopulmonary bypass may be necessary. The patient has been advised of a variety of complications that might develop including but not limited to risks of death, stroke, paravalvular leak, aortic dissection or other major vascular complications, aortic annulus rupture, device embolization, cardiac rupture or perforation, mitral regurgitation, acute myocardial infarction, arrhythmia, heart block or bradycardia requiring permanent pacemaker  placement, congestive heart failure, respiratory failure, renal failure, pneumonia, infection, other late complications related to structural valve deterioration or migration, or other complications that might ultimately cause a temporary or permanent loss of functional independence or other long term morbidity. The patient provides full informed consent for the procedure as described and all questions were answered.      Plan:   She will be scheduled for left subclavian insertion of a SAPIEN 3 transcatheter aortic valve on 11/28/2020.  I spent 60 minutes performing this consultation and > 50% of this time was spent face to face counseling and coordinating the care of this patient's severe symptomatic aortic stenosis.   Alleen Borne, MD 11/15/2020 11:53 AM

## 2020-11-16 ENCOUNTER — Other Ambulatory Visit: Payer: Self-pay | Admitting: Cardiovascular Disease

## 2020-11-21 ENCOUNTER — Other Ambulatory Visit: Payer: Self-pay

## 2020-11-21 DIAGNOSIS — I350 Nonrheumatic aortic (valve) stenosis: Secondary | ICD-10-CM

## 2020-11-23 NOTE — Progress Notes (Addendum)
Surgical Instructions    Your procedure is scheduled on Tuesday, November 8th.  Report to [facility] Main Entrance "A" at 7:15 A.M., then check in with the Admitting office.  Call this number if you have problems the morning of surgery:  [phone]   If you have any questions prior to your surgery date call [phone]: Open Monday-Friday 8am-4pm    Remember:  Do not eat or drink after midnight the night before your surgery     Take these medicines the morning of surgery with A SIP OF WATER NONE    As of today, STOP taking any Aspirin (unless otherwise instructed by your surgeon) Aleve, Naproxen, Ibuprofen, Motrin, Advil, Goody's, BC's, all herbal medications, fish oil, and all vitamins.  WHAT DO I DO ABOUT MY DIABETES MEDICATION?   Do not take oral diabetes medicines (pills) the morning of surgery. - Glimepiride (Amaryl), Pioglitzaone (Actos)  The day of surgery, take 50% of dose (5 units) of Insulin Glargine (Basaglar)  The day of surgery, do not take other diabetes injectables, including Ozempic.   HOW TO MANAGE YOUR DIABETES BEFORE AND AFTER SURGERY  Why is it important to control my blood sugar before and after surgery? Improving blood sugar levels before and after surgery helps healing and can limit problems. A way of improving blood sugar control is eating a healthy diet by:  Eating less sugar and carbohydrates  Increasing activity/exercise  Talking with your doctor about reaching your blood sugar goals High blood sugars (greater than 180 mg/dL) can raise your risk of infections and slow your recovery, so you will need to focus on controlling your diabetes during the weeks before surgery. Make sure that the doctor who takes care of your diabetes knows about your planned surgery including the date and location.  How do I manage my blood sugar before surgery? Check your blood sugar at least 4 times a day, starting 2 days before surgery, to make sure that the level  is not too high or low.  Check your blood sugar the morning of your surgery when you wake up and every 2 hours until you get to the Short Stay unit.  If your blood sugar is less than 70 mg/dL, you will need to treat for low blood sugar: Do not take insulin. Treat a low blood sugar (less than 70 mg/dL) with  cup of clear juice (cranberry or apple), 4 glucose tablets, OR glucose gel. Recheck blood sugar in 15 minutes after treatment (to make sure it is greater than 70 mg/dL). If your blood sugar is not greater than 70 mg/dL on recheck, call [phone] for further instructions. Report your blood sugar to the short stay nurse when you get to Short Stay.  If you are admitted to the hospital after surgery: Your blood sugar will be checked by the staff and you will probably be given insulin after surgery (instead of oral diabetes medicines) to make sure you have good blood sugar levels. The goal for blood sugar control after surgery is 80-180 mg/dL.   DAY OF SURGERY         Do not wear jewelry, makeup, or nail polish Do not wear lotions, powders, colognes, or deodorant. Do not shave 48 hours prior to surgery.   Do not bring valuables to the hospital.             [facility] is not responsible for any belongings or valuables.  Do NOT Smoke (Tobacco/Vaping)  24 hours prior to your procedure  If you use a CPAP at night, you may bring your mask for your overnight stay.   Contacts, glasses, hearing aids, dentures or partials may not be worn into surgery, please bring cases for these belongings   For patients admitted to the hospital, discharge time will be determined by your treatment team.   Patients discharged the day of surgery will not be allowed to drive home, and someone needs to stay with them for 24 hours.  NO VISITORS WILL BE ALLOWED IN PRE-OP WHERE PATIENTS ARE PREPPED FOR SURGERY.  ONLY 1 SUPPORT PERSON MAY BE PRESENT IN THE WAITING ROOM WHILE YOU ARE IN SURGERY.  IF YOU ARE TO BE  ADMITTED, ONCE YOU ARE IN YOUR ROOM YOU WILL BE ALLOWED TWO (2) VISITORS. 1 (ONE) VISITOR MAY STAY OVERNIGHT BUT MUST ARRIVE TO THE ROOM BY 8pm.  Minor children may have two parents present. Special consideration for safety and communication needs will be reviewed on a case by case basis.  Special instructions:    Oral Hygiene is also important to reduce your risk of infection.  Remember - BRUSH YOUR TEETH THE MORNING OF SURGERY WITH YOUR REGULAR TOOTHPASTE   [place]- Preparing For Surgery  Before surgery, you can play an important role. Because skin is not sterile, your skin needs to be as free of germs as possible. You can reduce the number of germs on your skin by washing with CHG (chlorahexidine gluconate) Soap before surgery.  CHG is an antiseptic cleaner which kills germs and bonds with the skin to continue killing germs even after washing.     Please do not use if you have an allergy to CHG or antibacterial soaps. If your skin becomes reddened/irritated stop using the CHG.  Do not shave (including legs and underarms) for at least 48 hours prior to first CHG shower. It is OK to shave your face.  Please follow these instructions carefully.     Shower the NIGHT BEFORE SURGERY and the MORNING OF SURGERY with CHG Soap.   If you chose to wash your hair, wash your hair first as usual with your normal shampoo. After you shampoo, rinse your hair and body thoroughly to remove the shampoo.  Then [employer] and genitals (private parts) with your normal soap and rinse thoroughly to remove soap.  After that Use CHG Soap as you would any other liquid soap. You can apply CHG directly to the skin and wash gently with a scrungie or a clean washcloth.   Apply the CHG Soap to your body ONLY FROM THE NECK DOWN.  Do not use on open wounds or open sores. Avoid contact with your eyes, ears, mouth and genitals (private parts). Wash Face and genitals (private parts)  with your normal soap.   Wash  thoroughly, paying special attention to the area where your surgery will be performed.  Thoroughly rinse your body with warm water from the neck down.  DO NOT shower/wash with your normal soap after using and rinsing off the CHG Soap.  Pat yourself dry with a CLEAN TOWEL.  Wear CLEAN PAJAMAS to bed the night before surgery  Place CLEAN SHEETS on your bed the night before your surgery  DO NOT SLEEP WITH PETS.   Day of Surgery:  Take a shower with CHG soap. Wear Clean/Comfortable clothing the morning of surgery Do not apply any deodorants/lotions.   Remember to brush your teeth WITH YOUR REGULAR TOOTHPASTE.   Please read over the following fact sheets that  you were given.

## 2020-11-24 ENCOUNTER — Encounter (HOSPITAL_COMMUNITY): Payer: Self-pay

## 2020-11-24 ENCOUNTER — Ambulatory Visit (HOSPITAL_COMMUNITY)
Admission: RE | Admit: 2020-11-24 | Discharge: 2020-11-24 | Disposition: A | Discharge status: Home / Self Care | Payer: Medicare Other | Source: Ambulatory Visit | Attending: Cardiovascular Disease | Admitting: Cardiovascular Disease

## 2020-11-24 ENCOUNTER — Encounter (HOSPITAL_COMMUNITY)
Admission: RE | Admit: 2020-11-24 | Discharge: 2020-11-24 | Disposition: A | Discharge status: Home / Self Care | Payer: Medicare Other | Source: Ambulatory Visit | Attending: Cardiovascular Disease | Admitting: Cardiovascular Disease

## 2020-11-24 ENCOUNTER — Ambulatory Visit: Payer: Medicare Other | Attending: Cardiovascular Disease | Admitting: Physical Therapy

## 2020-11-24 ENCOUNTER — Encounter: Payer: Self-pay | Admitting: Physical Therapy

## 2020-11-24 ENCOUNTER — Other Ambulatory Visit: Payer: Self-pay

## 2020-11-24 VITALS — BP 168/71 | HR 90 | Temp 98.1°F | Resp 19 | Ht 65.0 in | Wt 169.8 lb

## 2020-11-24 DIAGNOSIS — I350 Nonrheumatic aortic (valve) stenosis: Secondary | ICD-10-CM

## 2020-11-24 DIAGNOSIS — Z794 Long term (current) use of insulin: Secondary | ICD-10-CM | POA: Diagnosis not present

## 2020-11-24 DIAGNOSIS — E785 Hyperlipidemia, unspecified: Secondary | ICD-10-CM

## 2020-11-24 DIAGNOSIS — I2510 Atherosclerotic heart disease of native coronary artery without angina pectoris: Secondary | ICD-10-CM | POA: Insufficient documentation

## 2020-11-24 DIAGNOSIS — Z01818 Encounter for other preprocedural examination: Secondary | ICD-10-CM | POA: Insufficient documentation

## 2020-11-24 DIAGNOSIS — E1169 Type 2 diabetes mellitus with other specified complication: Secondary | ICD-10-CM

## 2020-11-24 DIAGNOSIS — Z20822 Contact with and (suspected) exposure to covid-19: Secondary | ICD-10-CM | POA: Diagnosis not present

## 2020-11-24 DIAGNOSIS — I10 Essential (primary) hypertension: Secondary | ICD-10-CM | POA: Diagnosis not present

## 2020-11-24 DIAGNOSIS — M797 Fibromyalgia: Secondary | ICD-10-CM | POA: Diagnosis not present

## 2020-11-24 DIAGNOSIS — E119 Type 2 diabetes mellitus without complications: Secondary | ICD-10-CM | POA: Insufficient documentation

## 2020-11-24 DIAGNOSIS — Z79899 Other long term (current) drug therapy: Secondary | ICD-10-CM | POA: Insufficient documentation

## 2020-11-24 DIAGNOSIS — R2689 Other abnormalities of gait and mobility: Secondary | ICD-10-CM | POA: Insufficient documentation

## 2020-11-24 DIAGNOSIS — I352 Nonrheumatic aortic (valve) stenosis with insufficiency: Secondary | ICD-10-CM | POA: Diagnosis not present

## 2020-11-24 DIAGNOSIS — G4733 Obstructive sleep apnea (adult) (pediatric): Secondary | ICD-10-CM | POA: Insufficient documentation

## 2020-11-24 HISTORY — DX: Hypothyroidism, unspecified: E03.9

## 2020-11-24 LAB — COMPREHENSIVE METABOLIC PANEL
ALT: 17 U/L (ref 0–44)
AST: 25 U/L (ref 15–41)
Albumin: 3.5 g/dL (ref 3.5–5.0)
Alkaline Phosphatase: 87 U/L (ref 38–126)
Anion gap: 10 (ref 5–15)
BUN: 22 mg/dL (ref 8–23)
CO2: 22 mmol/L (ref 22–32)
Calcium: 9.2 mg/dL (ref 8.9–10.3)
Chloride: 97 mmol/L — ABNORMAL LOW (ref 98–111)
Creatinine, Ser: 0.8 mg/dL (ref 0.44–1.00)
GFR, Estimated: >60 mL/min (ref >60)
Glucose, Bld: 268 mg/dL — ABNORMAL HIGH (ref 70–99)
Potassium: 4.9 mmol/L (ref 3.5–5.1)
Sodium: 129 mmol/L — ABNORMAL LOW (ref 135–145)
Total Bilirubin: 0.2 mg/dL — ABNORMAL LOW (ref 0.3–1.2)
Total Protein: 7.1 g/dL (ref 6.5–8.1)

## 2020-11-24 LAB — URINALYSIS, ROUTINE W REFLEX MICROSCOPIC
Bacteria, UA: NONE SEEN
Bilirubin Urine: NEGATIVE
Glucose, UA: >=500 mg/dL — AB
Hgb urine dipstick: NEGATIVE
Ketones, ur: NEGATIVE mg/dL
Leukocytes,Ua: NEGATIVE
Nitrite: NEGATIVE
Protein, ur: 100 mg/dL — AB
Specific Gravity, Urine: 1.013 (ref 1.005–1.030)
pH: 5 (ref 5.0–8.0)

## 2020-11-24 LAB — PROTIME-INR
INR: 0.9 (ref 0.8–1.2)
Prothrombin Time: 12.5 seconds (ref 11.4–15.2)

## 2020-11-24 LAB — TYPE AND SCREEN
ABO/RH(D): A POS
Antibody Screen: NEGATIVE

## 2020-11-24 LAB — CBC
HCT: 48.7 % — ABNORMAL HIGH (ref 36.0–46.0)
Hemoglobin: 15.7 g/dL — ABNORMAL HIGH (ref 12.0–15.0)
MCH: 29 pg (ref 26.0–34.0)
MCHC: 32.2 g/dL (ref 30.0–36.0)
MCV: 89.9 fL (ref 80.0–100.0)
Platelets: 362 10*3/uL (ref 150–400)
RBC: 5.42 MIL/uL — ABNORMAL HIGH (ref 3.87–5.11)
RDW: 14.1 % (ref 11.5–15.5)
WBC: 10.5 10*3/uL (ref 4.0–10.5)
nRBC: 0 % (ref 0.0–0.2)

## 2020-11-24 LAB — BLOOD GAS, ARTERIAL
Acid-base deficit: 0.7 mmol/L (ref 0.0–2.0)
Bicarbonate: 24.2 mmol/L (ref 20.0–28.0)
FIO2: 21
O2 Saturation: 93.2 %
Patient temperature: 37
pCO2 arterial: 45.7 mmHg (ref 32.0–48.0)
pH, Arterial: 7.344 — ABNORMAL LOW (ref 7.350–7.450)
pO2, Arterial: 67.1 mmHg — ABNORMAL LOW (ref 83.0–108.0)

## 2020-11-24 LAB — HEMOGLOBIN A1C
Hgb A1c MFr Bld: 12.5 % — ABNORMAL HIGH (ref 4.8–5.6)
Mean Plasma Glucose: 312.05 mg/dL

## 2020-11-24 LAB — SARS CORONAVIRUS 2 (TAT 6-24 HRS): SARS Coronavirus 2: NEGATIVE

## 2020-11-24 LAB — GLUCOSE, CAPILLARY: Glucose-Capillary: 294 mg/dL — ABNORMAL HIGH (ref 70–99)

## 2020-11-24 LAB — SURGICAL PCR SCREEN
MRSA, PCR: NEGATIVE
Staphylococcus aureus: NEGATIVE

## 2020-11-24 NOTE — Therapy (Signed)
[facility] [address] [place], [place], [address] Phone: [phone]   Fax:  [phone]  Physical Therapy Pre-TAVR Evaluation  Patient Details  Name: Kristina Montgomery MRN: [id number] Date of Birth: 1955-01-30 Referring Provider (PT): Kathleene Hazel, MD   Encounter Date: 11/24/2020   PT End of Session - 11/24/20 1012     Visit Number 1    Number of Visits 1    Date for PT Re-Evaluation 11/24/20    Authorization Type UHC MCR    PT Start Time 1000    PT Stop Time 1035    PT Time Calculation (min) 35 min    Equipment Utilized During Treatment Gait belt    Activity Tolerance Patient limited by fatigue    Behavior During Therapy WFL for tasks assessed/performed             Past Medical History:  Diagnosis Date   Anxiety    Aortic stenosis    Asthma    Chest pain    COPD (chronic obstructive pulmonary disease) (HCC)    Coronary artery disease    CVA (cerebral vascular accident) (HCC)    Depression    Diabetes mellitus without complication (HCC)    Dizziness    Emphysema of lung (HCC)    Fatigue    Fever    Fibromyalgia    Heart murmur    Hypertension    Mixed hyperlipidemia    Muscle pain    Rash    foot   Swelling     Past Surgical History:  Procedure Laterality Date   CHOLECYSTECTOMY     RIGHT/LEFT HEART CATH AND CORONARY ANGIOGRAPHY N/A 06/14/2020   Procedure: RIGHT/LEFT HEART CATH AND CORONARY ANGIOGRAPHY;  Surgeon: Kathleene Hazel, MD;  [facility];  Service: Cardiovascular;  Laterality: N/A;    There were no vitals filed for this visit.    Subjective Assessment - 11/24/20 1003     Subjective Patient reports she feels run down, no energy. States she gets up to do something and she feels really weak. She states she has trouble walking without assistance, reports he right leg is worse than her left.    Pertinent History CVA in 05/2020    Limitations Walking;House  hold activities    How long can you walk comfortably? "not too far"    Patient Stated Goals Get heart better    Currently in Pain? No/denies                [facility] PT Assessment - 11/24/20 0001       Assessment   Medical Diagnosis Severe Aortic Stenosis    Referring Provider (PT) Kathleene Hazel, MD    Onset Date/Surgical Date --   May 2022   Hand Dominance Right    Next MD Visit 11/24/2020    Prior Therapy No      Precautions   Precautions Fall      Restrictions   Weight Bearing Restrictions No      Balance Screen   Has the patient fallen in the past 6 months Yes    How many times? 3 - patient states right leg will give out on her or she will catch right foot while pivoting    Has the patient had a decrease in activity level because of a fear of falling?  Yes    Is the patient reluctant to leave their home because of a fear of falling?  Yes  Home Environment   Living Environment Private residence    Living Arrangements Non-relatives/Friends    Type of Home House    Home Access Stairs to enter    Entrance Stairs-Number of Steps 3    Entrance Stairs-Rails Right;Left    Home Layout One level    Home Equipment Walker - 2 wheels;Cane - single point      Prior Function   Level of Independence Independent with basic ADLs;Needs assistance with homemaking      Cognition   Overall Cognitive Status Within Functional Limits for tasks assessed      Observation/Other Assessments   Observations Patient appears in no apparent distress, accompanied by her son who assists with gait    Focus on Therapeutic Outcomes (FOTO)  NA      Posture/Postural Control   Posture Comments Rounded shoulder posture      ROM / Strength   AROM / PROM / Strength AROM;Strength      AROM   Overall AROM Comments BUE and BLE AROM grossly WFL except unable to actively dorsiflex right ankle      Strength   Overall Strength Comments BUE and LLE grossly 5/5 MMT, RLE grossly 4/5 MMT and  right ankle dorsiflexion grossly 0/5    Strength Assessment Site Hand    Right/Left hand Right;Left    Right Hand Grip (lbs) 40, 40, 41    Left Hand Grip (lbs) 32, 36, 32      Palpation   Palpation comment None      Transfers   Transfers Independent with all Transfers    Comments Patient does require assist for steadying once she stands      Ambulation/Gait   Ambulation/Gait Yes    Ambulation/Gait Assistance 4: Min guard    Assistive device Straight cane    Gait Comments Patient demonstrates a steppage gait on the right due to right foot drop, using SPC and occasionally requiring hand hold assist for steadying              [facility] Pre-Surgical Assessment - 11/24/20 0001     5 Meter Walk Test- trial 1 5 sec    5 Meter Walk Test- trial 2 6 sec.     5 Meter Walk Test- trial 3 6 sec.    5 meter walk test average 5.67 sec    4 Stage Balance Test tolerated for:  0 sec.    4 Stage Balance Test Position 1    comment unable to perform without support    Sit To Stand Test- trial 1 28 sec.    ADL/IADL Independent with: Bathing;Dressing;Meal prep;Finances    ADL/IADL Needs Assistance with: Pincus Badder work    ADL/IADL Fraility Index Vulnerable    6 Minute Walk- Baseline yes    BP (mmHg) 173/83    HR (bpm) 81    02 Sat (%RA) 93 %    Modified Borg Scale for Dyspnea 0- Nothing at all    Perceived Rate of Exertion (Borg) 6-    6 Minute Walk Post Test yes    BP (mmHg) 162/82    HR (bpm) 83    02 Sat (%RA) 93 %    Modified Borg Scale for Dyspnea 2- Mild shortness of breath    Perceived Rate of Exertion (Borg) 13- Somewhat hard    Aerobic Endurance Distance Walked 470   patient used SPC on right and hand hold assist on left by son for steadying   Endurance additional comments age matched  norm 1765                      Objective measurements completed on examination: See above findings.                PT Education - 11/24/20 1011     Education Details Exam  findings    Person(s) Educated Patient    Methods Explanation    Comprehension Verbalized understanding                         Plan - 11/24/20 1044     Clinical Impression Statement See below:    Personal Factors and Comorbidities Fitness;Past/Current Experience;Time since onset of injury/illness/exacerbation    Examination-Activity Limitations Locomotion Level;Stand;Stairs    Examination-Participation Restrictions Meal Prep;Cleaning;Community Activity;Laundry;Yard Work    Stability/Clinical Decision Making Stable/Uncomplicated    Clinical Decision Making Low    Rehab Potential Good    PT Frequency One time visit    PT Treatment/Interventions ADLs/Self Care Home Management;Aquatic Therapy;Cryotherapy;Electrical Stimulation;Neuromuscular re-education;Balance training;Therapeutic exercise;Therapeutic activities;Functional mobility training;Stair training;Gait training;Patient/family education;Dry needling;Passive range of motion;Taping;Energy conservation    PT Next Visit Plan NA    PT Home Exercise Plan NA    Recommended Other Services Neurology / Neuro PT for post-CVA and right foot drop    Consulted and Agree with Plan of Care Patient             Clinical Impression Statement: Pt is a 65 yo female presenting to OP PT for evaluation prior to possible TAVR surgery due to severe aortic stenosis. Pt reports onset of fatigue and exhaustion approximately 6 months ago. Symptoms are limiting walking and performing household tasks. Pt presents with good ROM and strength except RLE, poor balance and is at high fall risk 4 stage balance test, moderate walking speed and poor aerobic endurance per 6 minute walk test. Pt ambulated a total of 470 feet in 6 minute walk. RPE increased significantly with 6 minute walk test. Based on the Short Physical Performance Battery, patient has a frailty rating of 5/12 with </= 5/12 considered frail.    Patient demonstrates the following deficits  and impairments:  Abnormal gait, Decreased range of motion, Difficulty walking, Decreased strength, Postural dysfunction, Decreased balance, Decreased activity tolerance, Cardiopulmonary status limiting activity, Decreased endurance  Visit Diagnosis: Other abnormalities of gait and mobility     Problem List Patient Active Problem List   Diagnosis Date Noted   Abdominal aortic ectasia (HCC) 11/15/2020   Anxiety disorder 11/15/2020   Aortic valve disorder 11/15/2020   Atherosclerotic heart disease of native coronary artery without angina pectoris 11/15/2020   Atopic dermatitis 11/15/2020   Bipolar affective disorder, currently depressed, moderate (HCC) 11/15/2020   Cerebral infarction (HCC) 11/15/2020   Chronic kidney disease, stage 3 unspecified (HCC) 11/15/2020   Chronic obstructive pulmonary disease (HCC) 11/15/2020   Chronic pain syndrome 11/15/2020   Degeneration of lumbar intervertebral disc 11/15/2020   Encounter for therapeutic drug level monitoring 11/15/2020   Enthesopathy of knee 11/15/2020   Essential hypertension 11/15/2020   Gastro-esophageal reflux disease without esophagitis 11/15/2020   Hyperglycemia due to type 2 diabetes mellitus (HCC) 11/15/2020   Hypertensive heart disease without congestive heart failure 11/15/2020   Insomnia 11/15/2020   Major depression, single episode 11/15/2020   Mild recurrent major depression (HCC) 11/15/2020   Mixed hyperlipidemia 11/15/2020   Non-toxic multinodular goiter 11/15/2020   Obstructive sleep apnea syndrome 11/15/2020   Other vitamin B12  deficiency anemias 11/15/2020   Postherpetic neuralgia 11/15/2020   Primary generalized (osteo)arthritis 11/15/2020   Recurrent falls 11/15/2020   Restless legs syndrome 11/15/2020   Spasm of back muscles 11/15/2020   Thyrotoxicosis with diffuse goiter without thyrotoxic crisis or storm 11/15/2020   Vitamin D deficiency 11/15/2020   Chest pain    Severe aortic stenosis    Unstable  angina (HCC) 06/13/2020   LOC (loss of consciousness) (HCC) 06/01/2020   CVA (cerebral vascular accident) (HCC) 06/01/2020   Aortic stenosis 06/01/2020   History of COPD 06/01/2020   Type 2 diabetes mellitus with hyperlipidemia (HCC) 06/01/2020   Tobacco abuse 06/01/2020   Non-toxic goiter 03/21/2015    Rosana Hoes, PT, DPT, LAT, ATC 11/24/20  10:57 AM Phone: [phone] Fax: [phone]   [facility] [address] [place], [place], [address] Phone: [phone]   Fax:  [phone]  Name: Kristina Montgomery MRN: [id number] Date of Birth: December 11, 1955

## 2020-11-24 NOTE — Progress Notes (Signed)
PCP - Dr. Angelina Pih Cardiologist - Dr. Weston Brass  PPM/ICD - denies   Chest x-ray - 11/24/20 EKG - 11/24/20 Stress Test - pt says she had one but is unsure when ECHO - 06/01/20 Cardiac Cath - 06/14/20  Sleep Study - pt states it has been about 10 years ago, she was diagnosed with OSA and wore a CPAP for a while but now she can't find her machine so she hasn't used it in "a long time"   DM- Type 2 Pt states she checks her CBG about 3 times per week and is unsure what her fasting CBG is.  Blood Thinner Instructions: n/a Aspirin Instructions: pt instructed to stop taking ASA unless otherwise advised  ERAS Protcol - no, NPO   COVID TEST- 11/24/20 at PAT   Anesthesia review: Yes, cardiac hx  Patient denies shortness of breath, fever, cough and chest pain at PAT appointment   All instructions explained to the patient, with a verbal understanding of the material. Patient agrees to go over the instructions while at home for a better understanding. Patient also instructed to wear a mask in public after being tested for COVID-19. The opportunity to ask questions was provided.

## 2020-11-27 ENCOUNTER — Encounter (HOSPITAL_COMMUNITY): Payer: Self-pay

## 2020-11-27 MED ORDER — DEXMEDETOMIDINE HCL IN NACL 400 MCG/100ML IV SOLN
0.1000 ug/kg/h | INTRAVENOUS | Status: DC
  Filled 2020-11-27 (×2): qty 100

## 2020-11-27 MED ORDER — CEFAZOLIN SODIUM-DEXTROSE 2-4 GM/100ML-% IV SOLN
2.0000 g | INTRAVENOUS | Status: AC
  Administered 2020-11-28: 2 g via INTRAVENOUS
  Filled 2020-11-27 (×2): qty 100

## 2020-11-27 MED ORDER — MAGNESIUM SULFATE 50 % IJ SOLN
40.0000 meq | INTRAMUSCULAR | Status: DC
  Filled 2020-11-27: qty 9.85

## 2020-11-27 MED ORDER — NOREPINEPHRINE 4 MG/250ML-% IV SOLN
0.0000 ug/min | INTRAVENOUS | Status: AC
  Administered 2020-11-28: 1 ug/min via INTRAVENOUS
  Filled 2020-11-27: qty 250

## 2020-11-27 MED ORDER — POTASSIUM CHLORIDE 2 MEQ/ML IV SOLN
80.0000 meq | INTRAVENOUS | Status: DC
  Filled 2020-11-27: qty 40

## 2020-11-27 MED ORDER — HEPARIN 30,000 UNITS/1000 ML (OHS) CELLSAVER SOLUTION
Status: DC
  Filled 2020-11-27: qty 1000

## 2020-11-27 NOTE — Progress Notes (Signed)
Anesthesia Chart Review:  Case: [id number] Date/Time: 11/28/20 0900   Procedures:      TRANSCATHETER AORTIC VALVE REPLACEMENT,SUBCLAVIAN (Left)     TRANSESOPHAGEAL ECHOCARDIOGRAM (TEE)   Anesthesia type: General   Pre-op diagnosis: Severe Aortic Stenosis   [facility] / [facility]   Providers: Kathleene Hazel, MD     CT Surgeon: Evelene Croon, MD  DISCUSSION: Patient is a 65 year old female scheduled for the above procedure. She presented for cath on 06/01/20 as part of TAVR work-up, but had lethargy and altered mental status. O2 sat in the upper 80-low 90's and placed on O2. Partial improvement after Narcan. Cath cancelled. Head CT showed likely acute left occipital lobe infarct, and admitted overnight for further work-up and started on ASA and Plavix.  Admitted 06/13/20-06/15/20 for chest pain with cath showing mild non-obstructive CAD, severe AS. TAVR planned but wanted to delay at least 8 weeks post CVA.   History includes smoking, DM2, HTN, asthma, COPD/emphysema, severe AS (with chest pain), mild CAD (09/2020), fibromyalgia, OSA (does not use CPAP), CVA (06/01/20), hyperthyroidism (2018; on levothyroxine for hypothyroidism).   Na 129, corrected Sodium for glucose is 132 mEq/L. A1c 12.5% (previously 11.1% 5 months ago). Labs are marked as reviewed by Dr. Clifton James.  She does not know how her fasting CBGs run.  11/24/2020 presurgical COVID-19 test negative.  Anesthesia team to evaluate on the day of surgery.  She will get a CBG on arrival.   VS: BP (!) 168/71   Pulse 90   Temp 36.7 C (Oral)   Resp 19   Ht 5\' 5"  (1.651 m)   Wt 77 kg   SpO2 97%   BMI 28.26 kg/m    PROVIDERS: [place], , MD his PCP Myrene Galas, MD is primary cardiologist Weston Brass, MD is structural heart cardiologist   LABS: Preoperative labs noted.  See DISCUSSION. (all labs ordered are listed, but only abnormal results are displayed)  Labs Reviewed  GLUCOSE,  CAPILLARY - Abnormal; Notable for the following components:      Result Value   Glucose-Capillary 294 (*)    All other components within normal limits  CBC - Abnormal; Notable for the following components:   RBC 5.42 (*)    Hemoglobin 15.7 (*)    HCT 48.7 (*)    All other components within normal limits  COMPREHENSIVE METABOLIC PANEL - Abnormal; Notable for the following components:   Sodium 129 (*)    Chloride 97 (*)    Glucose, Bld 268 (*)    Total Bilirubin 0.2 (*)    All other components within normal limits  URINALYSIS, ROUTINE W REFLEX MICROSCOPIC - Abnormal; Notable for the following components:   Glucose, UA >=500 (*)    Protein, ur 100 (*)    All other components within normal limits  BLOOD GAS, ARTERIAL - Abnormal; Notable for the following components:   pH, Arterial 7.344 (*)    pO2, Arterial 67.1 (*)    Allens test (pass/fail) BRACHIAL ARTERY (*)    All other components within normal limits  HEMOGLOBIN A1C - Abnormal; Notable for the following components:   Hgb A1c MFr Bld 12.5 (*)    All other components within normal limits  SARS CORONAVIRUS 2 (TAT 6-24 HRS)  SURGICAL PCR SCREEN  PROTIME-INR  TYPE AND SCREEN     IMAGES: CXR 11/24/20: FINDINGS: The heart size and mediastinal contours are within normal limits. Both lungs are clear. The visualized skeletal structures  are unremarkable. IMPRESSION: No active cardiopulmonary disease.  MRI/MRA Head 06/02/20: IMPRESSION: MRI brain: 1. 3.0 x 2.6 x 3.3 cm cortical/subcortical left parietal lobe infarct, as described and likely subacute. Petechial hemorrhage is present at this site. No significant mass effect. 2. Two acute/early subacute infarcts measuring up to 12 mm within the left frontoparietal subcortical white matter. 3. Background moderate cerebral white matter chronic small vessel ischemic disease.   MRA head: 1. No intracranial large vessel occlusion. 2. Severe stenosis within a proximal to mid left  M2 MCA vessel. 3. Mild atherosclerotic irregularity of the intracranial internal carotid arteries bilaterally. 4. 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 5. 1-2 mm inferiorly projecting vascular protrusion arising from the supraclinoid right ICA, which may reflect an aneurysm or infundibulum. 6. 2 mm vascular protrusion projecting anteriorly from the cavernous left ICA, suspicious for aneurysm. 7. 1 mm vascular protrusion projecting medially from the cavernous left ICA, which could reflect an additional small aneurysm.     EKG: 11/24/20: Normal sinus rhythm Possible Left atrial enlargement Right bundle branch block Abnormal ECG No significant change since last tracing Confirmed by Nicki Guadalajara ([id number]) on 11/24/2020 5:39:32 PM   CV: CT Coronary 10/18/20: IMPRESSION: 1. Tricuspid aortic valve. Severely reduced cusp separation. Severely thickened, severely calcified aortic valve cusps. 2.  AV calcium score: 2088 3. Annulus area: 446 mm2, no significant LVOT calcifications. The annulus would be suitable for a 26 mm Sapien 3 valve. 4.  Sufficient coronary artery heights. 5.  Severe aortic atherosclerosis in the descending thoracic aorta. 6. Optimum Fluoroscopic Angle for Delivery: LAO 2, CAU 3    Cardiac cath 06/14/20: Prox RCA lesion is 30% stenosed. Mid RCA lesion is 30% stenosed. 2nd Mrg lesion is 30% stenosed. Ramus lesion is 30% stenosed.   1. Mild non-obstructive CAD 2. Severe aortic stenosis by echo (by cath mean gradient 27.7 mmHg, peak to peak gradient 28 mmHg)   Recommendations: Continue workup for TAVR. I would still like to delay TAVR for at least 8 weeks post CVA if possible. We can arrange her CT scans next.     US Carotid 06/01/20: Summary:  - Right Carotid: Velocities in the right ICA are consistent with a 1-39%  stenosis.                 The ECA appears >50% stenosed.  -  Left Carotid: Velocities in the left ICA are consistent with a 1-39%  stenosis.  - Vertebrals:  Bilateral vertebral arteries demonstrate antegrade flow.  - Subclavians: Normal flow hemodynamics were seen in bilateral subclavian               arteries.    Echo 06/01/20: IMPRESSIONS   1. Left ventricular ejection fraction, by estimation, is 60 to 65%. The  left ventricle has normal function. The left ventricle has no regional  wall motion abnormalities. There is moderate left ventricular hypertrophy.  Left ventricular diastolic  parameters are consistent with Grade I diastolic dysfunction (impaired  relaxation).   2. Right ventricular systolic function is normal. The right ventricular  size is normal. There is normal pulmonary artery systolic pressure.   3. Left atrial size was moderately dilated.   4. The mitral valve is grossly normal. Mild mitral valve regurgitation.   5. The aortic valve was not well visualized. Aortic valve regurgitation  is trivial.   6. The inferior vena cava is normal in size with greater than 50%  respiratory variability, suggesting right atrial pressure of 3 mmHg.  - Comparison Echo 04/03/20: LVEF 60-65%, no regional wall motion abnormality, grade 1 DD, moderate AI, severe AS, AV peak velocity 4.06 m/s, mean gradient 33.37 mmHg, peak gradient 65.91 mmHg, AVA 0.9 cm2   Past Medical History:  Diagnosis Date   Anxiety    Aortic stenosis    Asthma    Chest pain    COPD (chronic obstructive pulmonary disease) (HCC)    Coronary artery disease    CVA (cerebral vascular accident) (HCC)    Depression    Diabetes mellitus without complication (HCC)    Dizziness    Emphysema of lung (HCC)    Fatigue    Fever    Fibromyalgia    Heart murmur    pt states she has had this all of her life, she says no one has mentioned it in a long time   Hypertension    Hyperthyroidism 2018   Mixed hyperlipidemia    Muscle pain    Rash    foot   Sleep apnea 2012   Swelling      Past Surgical History:  Procedure Laterality Date   CHOLECYSTECTOMY     RIGHT/LEFT HEART CATH AND CORONARY ANGIOGRAPHY N/A 06/14/2020   Procedure: RIGHT/LEFT HEART CATH AND CORONARY ANGIOGRAPHY;  Surgeon: Kathleene Hazel, MD;  [facility];  Service: Cardiovascular;  Laterality: N/A;    MEDICATIONS:  albuterol (VENTOLIN HFA) 108 (90 Base) MCG/ACT inhaler   amitriptyline (ELAVIL) 50 MG tablet   amLODipine (NORVASC) 10 MG tablet   ARIPiprazole (ABILIFY) 10 MG tablet   aspirin EC 81 MG tablet   baclofen (LIORESAL) 20 MG tablet   Budeson-Glycopyrrol-Formoterol (BREZTRI AEROSPHERE) 160-9-4.8 MCG/ACT AERO   desvenlafaxine (PRISTIQ) 50 MG 24 hr tablet   doxepin (SINEQUAN) 75 MG capsule   glimepiride (AMARYL) 4 MG tablet   Insulin Glargine (BASAGLAR KWIKPEN) 100 UNIT/ML   levothyroxine (SYNTHROID) 100 MCG tablet   meclizine (ANTIVERT) 25 MG tablet   metoprolol tartrate (LOPRESSOR) 25 MG tablet   nitroGLYCERIN (NITROSTAT) 0.4 MG SL tablet   ondansetron (ZOFRAN-ODT) 8 MG disintegrating tablet   oxyCODONE 30 MG 12 hr tablet   OZEMPIC, 0.25 OR 0.5 MG/DOSE, 2 MG/1.5ML SOPN   pioglitazone (ACTOS) 45 MG tablet   pramipexole (MIRAPEX) 0.25 MG tablet   rosuvastatin (CRESTOR) 40 MG tablet   SYNTHROID 25 MCG tablet   No current facility-administered medications for this encounter.    [START ON 11/28/2020] ceFAZolin (ANCEF) IVPB 2g/100 mL premix   [START ON 11/28/2020] dexmedetomidine (PRECEDEX) 400 MCG/100ML (4 mcg/mL) infusion   [START ON 11/28/2020] heparin 30,000 units/NS 1000 mL solution for CELLSAVER   [START ON 11/28/2020] magnesium sulfate (IV Push/IM) injection 40 mEq   [START ON 11/28/2020] norepinephrine (LEVOPHED) 4mg  in premix infusion   [START ON 11/28/2020] potassium chloride injection 80 mEq    13/08/2020, PA-C Surgical Short Stay/Anesthesiology [facility] Phone [phone] [facility] Phone [phone] 11/27/2020 11:14 AM

## 2020-11-27 NOTE — H&P (Signed)
[address]       [place] [address]             [phone]      Cardiothoracic Surgery Admission History and Physical  PCP is Hague, Rosalyn Charters, MD Referring Provider is Lauree Chandler, MD Primary Cardiologist is Elouise Munroe, MD   Reason for admission:  Severe aortic stenosis   HPI:   The patient is a 65 year old woman with a history of type 2 diabetes, hypertension, hyperlipidemia, ongoing 1 pack/day smoking with asthma/COPD, fibromyalgia, anxiety/depression, recent stroke, mild coronary artery disease, and severe aortic stenosis who was referred for consideration of TAVR.  She presented with progressive exertional fatigue and shortness of breath as well as chest pressure and dizziness.  She was seen by Dr. Margaretann Loveless in April 2022 and echocardiogram at that time showed severe aortic stenosis with a mean gradient of 40 mmHg and a peak gradient of 64 mmHg.  Aortic valve area was 0.9 cm with a dimensionless index of 0.31.  Left ventricular ejection fraction was 60 to 65%.  She was referred to Dr. Angelena Form and was set up for cardiac catheterization but when she came in for cath she was found to have altered mental status and was diagnosed with an acute stroke.  This was a cortical/subcortical infarct involving the left parietal lobe measuring 3 x 3 cm.  There was some developing encephalomalacia at the site and this infarct was felt to be subacute.  There were 2 acute/early subacute infarcts within the posterior left frontoparietal subcortical white matter.  Carotid Dopplers showed mild nonobstructive disease bilaterally.  She was discharged on aspirin and Plavix but did not start taking the Plavix and reported missing doses of her aspirin.  She was seen back in the cardiology office having chest discomfort and was admitted.  She underwent cardiac catheterization on 06/14/2020 showing mild nonobstructive coronary disease.  The mean gradient across the aortic valve  was 28 mmHg.  There was some difficulty contacting her to complete her TAVR work-up but this was eventually completed in September.   She continues to have shortness of breath and fatigue with exertion as well as some dizziness and lower extremity edema.  She continues to smoke about 1 pack of cigarettes per day.  She lives alone in [place] and has been on disability for 20 years due to depression.  She has some residual right leg weakness from her stroke and some difficulty finding the right words at times.       Past Medical History:  Diagnosis Date   Anxiety     Aortic stenosis     Asthma     Chest pain     COPD (chronic obstructive pulmonary disease) (HCC)     Coronary artery disease     CVA (cerebral vascular accident) ([facility])     Depression     Diabetes mellitus without complication (HCC)     Dizziness     Emphysema of lung (HCC)     Fatigue     Fever     Fibromyalgia     Heart murmur     Hypertension     Mixed hyperlipidemia     Muscle pain     Rash      foot   Swelling             Past Surgical History:  Procedure Laterality Date   CHOLECYSTECTOMY       RIGHT/LEFT  HEART CATH AND CORONARY ANGIOGRAPHY N/A 06/14/2020    Procedure: RIGHT/LEFT HEART CATH AND CORONARY ANGIOGRAPHY;  Surgeon: Burnell Blanks, MD;  Location: [facility];  Service: Cardiovascular;  Laterality: N/A;           Family History  Problem Relation Age of Onset   Heart disease Mother     Cancer Father        Social History         Socioeconomic History   Marital status: Widowed      Spouse name: Not on file   Number of children: 4   Years of education: Not on file   Highest education level: Not on file  Occupational History   Occupation: On disability for 20 years  Tobacco Use   Smoking status: Every Day      Packs/day: 1.00      Years: 35.00      Pack years: 35.00      Types: Cigarettes   Smokeless tobacco: Never  Vaping Use   Vaping Use: Never used   Substance and Sexual Activity   Alcohol use: No   Drug use: No   Sexual activity: Not on file  Other Topics Concern   Not on file  Social History Narrative   Not on file    Social Determinants of Health    Financial Resource Strain: Not on file  Food Insecurity: Not on file  Transportation Needs: Not on file  Physical Activity: Not on file  Stress: Not on file  Social Connections: Not on file  Intimate Partner Violence: Not on file             Prior to Admission medications   Medication Sig Start Date End Date Taking? Authorizing Provider  amitriptyline (ELAVIL) 50 MG tablet Take 50 mg by mouth at bedtime.     Yes [provider]  amLODipine (NORVASC) 10 MG tablet Take 10 mg by mouth daily.     Yes [provider]  desvenlafaxine (PRISTIQ) 50 MG 24 hr tablet Take 50 mg by mouth daily.     Yes [provider]  glimepiride (AMARYL) 4 MG tablet Take 4 mg by mouth daily with breakfast.     Yes [provider]  levothyroxine (SYNTHROID) 50 MCG tablet Take 50 mcg by mouth daily before breakfast.     Yes [provider]  metoprolol tartrate (LOPRESSOR) 25 MG tablet Take 1 tablet (25 mg total) by mouth 2 (two) times daily. 06/15/20   Yes Reino Bellis B, NP  ondansetron (ZOFRAN-ODT) 8 MG disintegrating tablet Take 8 mg by mouth every 8 (eight) hours as needed for nausea or vomiting.     Yes [provider]  oxyCODONE 30 MG 12 hr tablet Take 1 tablet by mouth every 12 (twelve) hours.     Yes [provider]  rosuvastatin (CRESTOR) 40 MG tablet Take 40 mg by mouth daily.     Yes [provider]  sitaGLIPtin (JANUVIA) 50 MG tablet Take 50 mg by mouth daily.     Yes [provider]            Current Outpatient Medications  Medication Sig Dispense Refill   amitriptyline (ELAVIL) 50 MG tablet Take 50 mg by mouth at bedtime.       amLODipine (NORVASC) 10 MG tablet Take 10 mg by mouth daily.        desvenlafaxine (PRISTIQ) 50 MG 24 hr tablet Take 50 mg by mouth  daily.       glimepiride (AMARYL) 4 MG tablet Take 4 mg by mouth daily with breakfast.       levothyroxine (SYNTHROID) 50 MCG tablet Take 50 mcg by mouth daily before breakfast.       metoprolol tartrate (LOPRESSOR) 25 MG tablet Take 1 tablet (25 mg total) by mouth 2 (two) times daily. 60 tablet 1   ondansetron (ZOFRAN-ODT) 8 MG disintegrating tablet Take 8 mg by mouth every 8 (eight) hours as needed for nausea or vomiting.       oxyCODONE 30 MG 12 hr tablet Take 1 tablet by mouth every 12 (twelve) hours.       rosuvastatin (CRESTOR) 40 MG tablet Take 40 mg by mouth daily.       sitaGLIPtin (JANUVIA) 50 MG tablet Take 50 mg by mouth daily.        No current facility-administered medications for this visit.           Allergies  Allergen Reactions   Codeine        Unknown          Review of Systems:               General:                      normal appetite, + decreased  energy, no weight gain, no weight loss, no fever             Cardiac:                       + chest pain with exertion, no chest pain at rest, +SOB with mild exertion, no resting SOB, no PND, no orthopnea, no palpitations, no arrhythmia, no atrial fibrillation, + LE edema, + dizzy spells, no syncope             Respiratory:                 + exertional shortness of breath, no home oxygen, no productive cough, no dry cough, no bronchitis, + wheezing, no hemoptysis, + asthma, no pain with inspiration or cough, no sleep apnea, no CPAP at night             GI:                               no difficulty swallowing, no reflux, no frequent heartburn, no hiatal hernia, no abdominal pain, no constipation, no diarrhea, no hematochezia, no hematemesis, no melena             GU:                              no dysuria,  + frequency, no urinary tract infection, no hematuria, no kidney stones, no kidney disease             Vascular:                     no pain suggestive  of claudication, no pain in feet, no leg cramps, no varicose veins, no DVT, no non-healing foot ulcer             Neuro:                         + stroke,  no TIA's, no seizures, no headaches, no temporary blindness one eye,  no slurred speech, no peripheral neuropathy, no chronic pain, + instability of gait, + memory/cognitive dysfunction             Musculoskeletal:         no arthritis, no joint swelling, no myalgias, + difficulty walking, + decreased mobility              Skin:                            no rash, no itching, no skin infections, no pressure sores or ulcerations             Psych:                         + anxiety, + depression, + nervousness, no unusual recent stress             Eyes:                           no blurry vision, no floaters, no recent vision changes, no glasses or contacts             ENT:                            no hearing loss, no loose or painful teeth, +upper dentures and no teeth on bottom. Does not see a dentist.             Hematologic:               no easy bruising, no abnormal bleeding, no clotting disorder, no frequent epistaxis             Endocrine:                   + diabetes, does not check CBG's at home                            Physical Exam:               BP (!) 157/74   Pulse (!) 102   Resp 20   Ht 5\' 5"  (1.651 m)   Wt 172 lb (78 kg)   SpO2 91% Comment: RA  BMI 28.62 kg/m              General:                      Looks older than stated age,  well-appearing             HEENT:                       Unremarkable, NCAT, PERLA, EOMI             Neck:                           no JVD, + bruits or transmitted murmur bilaterally, no adenopathy              Chest:  clear to auscultation, symmetrical breath sounds, no wheezes, no rhonchi              CV:                              RRR, 3/6 systolic murmur RSB, no diastolic murmur             Abdomen:                    soft, non-tender, no masses               Extremities:                 warm, well-perfused, pulses not palpable at ankle, trace lower extremity edema             Rectal/GU                   Deferred             Neuro:                         Grossly non-focal and symmetrical throughout             Skin:                            Clean and dry, no rashes, no breakdown   Diagnostic Tests:       ECHOCARDIOGRAM REPORT         Patient Name:   RUSTIE GILL Date of Exam: 06/01/2020  Medical Rec #:  [id number]       Height:       65.0 in  Accession #:    [id number]      Weight:       184.3 lb  Date of Birth:  09/27/1955       BSA:          1.911 m  Patient Age:    10 years        BP:           114/68 mmHg  Patient Gender: F               HR:           85 bpm.  Exam Location:  Inpatient   Procedure: 2D Echo, Cardiac Doppler and Color Doppler   Indications:    CVA     History:        Patient has prior history of Echocardiogram examinations,  most                  recent 05/03/2020. CAD, COPD, Signs/Symptoms:Murmur; Risk                  Factors:Dyslipidemia, Diabetes and Hypertension.     Sonographer:    Luisa Hart RDCS  Referring Phys: [id number] RONDELL A SMITH      Sonographer Comments: Technically challenging study due to limited  acoustic windows and patient is morbidly obese.  IMPRESSIONS     1. Left ventricular ejection fraction, by estimation, is 60 to 65%. The  left ventricle has normal function. The left ventricle has no regional  wall motion abnormalities. There is moderate left ventricular hypertrophy.  Left ventricular diastolic  parameters are consistent with Grade I diastolic dysfunction (impaired  relaxation).  2. Right ventricular systolic function is normal. The right ventricular  size is normal. There is normal pulmonary artery systolic pressure.   3. Left atrial size was moderately dilated.   4. The mitral valve is grossly normal. Mild mitral valve regurgitation.   5. The aortic valve was not well  visualized. Aortic valve regurgitation  is trivial.   6. The inferior vena cava is normal in size with greater than 50%  respiratory variability, suggesting right atrial pressure of 3 mmHg.   FINDINGS   Left Ventricle: Left ventricular ejection fraction, by estimation, is 60  to 65%. The left ventricle has normal function. The left ventricle has no  regional wall motion abnormalities. The left ventricular internal cavity  size was normal in size. There is   moderate left ventricular hypertrophy. Left ventricular diastolic  parameters are consistent with Grade I diastolic dysfunction (impaired  relaxation).   Right Ventricle: The right ventricular size is normal. No increase in  right ventricular wall thickness. Right ventricular systolic function is  normal. There is normal pulmonary artery systolic pressure. The tricuspid  regurgitant velocity is 2.62 m/s, and   with an assumed right atrial pressure of 3 mmHg, the estimated right  ventricular systolic pressure is 30.5 mmHg.   Left Atrium: Left atrial size was moderately dilated.   Right Atrium: Right atrial size was normal in size.   Pericardium: There is no evidence of pericardial effusion.   Mitral Valve: The mitral valve is grossly normal. Mild mitral valve  regurgitation. MV peak gradient, 12.4 mmHg. The mean mitral valve gradient  is 5.0 mmHg.   Tricuspid Valve: The tricuspid valve is not well visualized. Tricuspid  valve regurgitation is not demonstrated.   Aortic Valve: The aortic valve was not well visualized. Aortic valve  regurgitation is trivial. Aortic regurgitation PHT measures 279 msec.  Aortic valve mean gradient measures 29.3 mmHg. Aortic valve peak gradient  measures 53.8 mmHg. Aortic valve area, by   VTI measures 1.29 cm.   Pulmonic Valve: The pulmonic valve was not well visualized. Pulmonic valve  regurgitation is not visualized.   Aorta: The aortic root and ascending aorta are structurally normal, with   no evidence of dilitation.   Venous: The inferior vena cava is normal in size with greater than 50%  respiratory variability, suggesting right atrial pressure of 3 mmHg.   IAS/Shunts: The atrial septum is grossly normal.      LEFT VENTRICLE  PLAX 2D  LVIDd:         4.90 cm     Diastology  LVIDs:         3.30 cm     LV e' medial:    4.38 cm/s  LV PW:         1.70 cm     LV E/e' medial:  23.1  LV IVS:        1.40 cm     LV e' lateral:   4.38 cm/s  LVOT diam:     2.40 cm     LV E/e' lateral: 23.1  LV SV:         95  LV SV Index:   50  LVOT Area:     4.52 cm     LV Volumes (MOD)  LV vol d, MOD A2C: 46.0 ml  LV vol d, MOD A4C: 94.4 ml  LV vol s, MOD A2C: 30.9 ml  LV vol s, MOD A4C: 36.8 ml  LV SV MOD A2C:  15.1 ml  LV SV MOD A4C:     94.4 ml  LV SV MOD BP:      31.6 ml   RIGHT VENTRICLE  RV S prime:     14.70 cm/s  TAPSE (M-mode): 1.6 cm   LEFT ATRIUM             Index       RIGHT ATRIUM           Index  LA diam:        4.10 cm 2.15 cm/m  RA Area:     12.20 cm  LA Vol (A2C):   91.8 ml 48.05 ml/m RA Volume:   24.70 ml  12.93 ml/m  LA Vol (A4C):   60.7 ml 31.77 ml/m  LA Biplane Vol: 76.8 ml 40.20 ml/m   AORTIC VALVE                    PULMONIC VALVE  AV Area (Vmax):    1.41 cm     PV Vmax:       2.10 m/s  AV Area (Vmean):   1.27 cm     PV Vmean:      220.000 cm/s  AV Area (VTI):     1.29 cm     PV VTI:        0.394 m  AV Vmax:           366.67 cm/s  PV Peak grad:  17.6 mmHg  AV Vmean:          246.000 cm/s PV Mean grad:  21.0 mmHg  AV VTI:            0.739 m  AV Peak Grad:      53.8 mmHg  AV Mean Grad:      29.3 mmHg  LVOT Vmax:         114.00 cm/s  LVOT Vmean:        69.300 cm/s  LVOT VTI:          0.211 m  LVOT/AV VTI ratio: 0.29  AI PHT:            279 msec     AORTA  Ao Root diam: 2.80 cm  Ao Asc diam:  3.10 cm   MITRAL VALVE                TRICUSPID VALVE  MV Area (PHT): 4.21 cm     TR Peak grad:   27.5 mmHg  MV Area VTI:   2.90 cm     TR Vmax:         262.00 cm/s  MV Peak grad:  12.4 mmHg  MV Mean grad:  5.0 mmHg     SHUNTS  MV Vmax:       1.76 m/s     Systemic VTI:  0.21 m  MV Vmean:      99.1 cm/s    Systemic Diam: 2.40 cm  MV Decel Time: 180 msec  MR Peak grad: 93.3 mmHg  MR Mean grad: 62.0 mmHg  MR Vmax:      483.00 cm/s  MR Vmean:     376.0 cm/s  MV E velocity: 101.00 cm/s  MV A velocity: 162.00 cm/s  MV E/A ratio:  0.62   Kristeen MissPhilip Nahser MD  Electronically signed by Kristeen MissPhilip Nahser MD  Signature Date/Time: 06/01/2020/4:42:14 PM         Final       Physicians Panel Physicians  Referring Physician Case Authorizing Physician  Burnell Blanks, MD (Primary)      Procedures RIGHT/LEFT HEART CATH AND CORONARY ANGIOGRAPHY  Conclusion Prox RCA lesion is 30% stenosed.  Mid RCA lesion is 30% stenosed.  2nd Mrg lesion is 30% stenosed.  Ramus lesion is 30% stenosed. 1. Mild non-obstructive CAD  2. Severe aortic stenosis by echo (by cath mean gradient 27.7 mmHg, peak to peak gradient 28 mmHg)  Recommendations: Continue workup for TAVR. I would still like to delay TAVR for at least 8 weeks post CVA if possible. We can arrange her CT scans next.  Indications Chest pain of uncertain etiology A999333 (ICD-10-CM)]  Severe aortic stenosis [I35.0 (ICD-10-CM)]  Procedural Details Technical Details Indication: Severe aortic stenosis. Admitted with chest pain.   Procedure: The risks, benefits, complications, treatment options, and expected outcomes were discussed with the patient. The patient and/or family concurred with the proposed plan, giving informed consent. The patient was brought to the cath lab after IV hydration was given. The patient was sedated with Versed and Fentanyl. The IV catheter present in the right antecubital vein was changed for a 7 [place] sheath. Right heart catheterization performed with a balloon tipped catheter. The right groin was prepped and draped in the usual manner. Using the modified Seldinger access  technique, a 5 French sheath was placed in the right femoral artery using u/s guidance. Standard diagnostic catheters were used to perform selective coronary angiography. The aortic valve was crossed with an AL-2 catheter and a straight wire.   There were no immediate complications. The patient was taken to the recovery area in stable condition.   Estimated blood loss <50 mL.   During this procedure medications were administered to achieve and maintain moderate conscious sedation while the patient's heart rate, blood pressure, and oxygen saturation were continuously monitored and I was present face-to-face 100% of this time.  Medications (Filter: Administrations occurring from 1637 to 1749 on 06/14/20)  important Continuous medications are totaled by the amount administered until 06/14/20 1749.  fentaNYL (SUBLIMAZE) injection (mcg) Total dose: 50 mcg  Date/Time Rate/Dose/Volume Action     06/14/20 1642 25 mcg Given    1706 25 mcg Given    midazolam (VERSED) injection (mg) Total dose: 2 mg  Date/Time Rate/Dose/Volume Action     06/14/20 1642 1 mg Given    1645 1 mg Given    lidocaine (PF) (XYLOCAINE) 1 % injection (mL) Total volume: 4 mL  Date/Time Rate/Dose/Volume Action     06/14/20 1653 2 mL Given    1655 2 mL Given    Heparin (Porcine) in NaCl 1000-0.9 UT/500ML-% SOLN (mL) Total volume: 1,000 mL  Date/Time Rate/Dose/Volume Action     06/14/20 1653 500 mL Given    1653 500 mL Given    iohexol (OMNIPAQUE) 350 MG/ML injection (mL) Total volume: 50 mL  Date/Time Rate/Dose/Volume Action     06/14/20 1741 50 mL Given    fentaNYL (SUBLIMAZE) injection (mcg) Total dose: 25 mcg  Date/Time Rate/Dose/Volume Action     06/14/20 1730 25 mcg Given    insulin aspart (novoLOG) injection 0-15 Units (Units) Total dose: Cannot be calculated* Dosing weight: 79.3  *Administration dose not documented  Date/Time Rate/Dose/Volume Action     06/14/20 1700 *Not included in total  Automatically Held    Sedation Time Sedation Time Physician-1: 58 minutes 35 seconds  Contrast Medication Name Total Dose  iohexol (OMNIPAQUE) 350 MG/ML injection 50 mL  Radiation/Fluoro Fluoro time: 11.9 (min)  DAP:  [id number] (mGycm2)  Cumulative Air Kerma: 99991111 (mGy)  Complications    Complications documented before study signed (06/14/2020 5:55 PM)  RIGHT/LEFT HEART CATH AND CORONARY ANGIOGRAPHY  None Documented by Burnell Blanks, MD 06/14/2020 5:53 PM  Date Found: 06/14/2020  Time Range: Intraprocedure     Coronary Findings Diagnostic Dominance: Right  Left Anterior Descending  Vessel is large.    Ramus Intermedius  Ramus lesion is 30% stenosed.    Left Circumflex    Second Obtuse Marginal Branch  2nd Mrg lesion is 30% stenosed.    Right Coronary Artery  Vessel is large.  Prox RCA lesion is 30% stenosed.  Mid RCA lesion is 30% stenosed.  Intervention No interventions have been documented.  Coronary Diagrams Diagnostic Dominance: Right  &&&&&&  Intervention Implants                  Vascular Products  Closure Mynx Control 31f [id number] - Implanted  Inventory item: CLOSURE [facility] CONTROL 12F Model/Cat number: [id number]  Manufacturer: [employer] Lot number: [id number]  Device identifier: [id number] Device identifier type: GS1  GUDID Information    Request status Successful        Brand name: MYNX CONTROL Version/Model: [id number]    Company name: [employer] safety info as of 06/14/20: MR Safe   Contains dry or latex rubber: No       GMDN P.T. name: Wound hydrogel dressing, non-antimicrobial       As of 06/14/2020     Status: Implanted           [employer]ng[employer] Images Show images for CARDIAC CATHETERIZATION  Images on Long Term Storage Show images for Margaree, Trinka to Procedure Log   Procedure Log  Hemo Data Flowsheet Row Most Recent Value  Fick Cardiac Output 7.06 L/min  Fick Cardiac Output Index 3.75 (L/min)/BSA  Aortic Mean  Gradient 27.7 mmHg  Aortic Peak Gradient 28 mmHg  Aortic Valve Area 1.31  Aortic Value Area Index 0.69 cm2/BSA  RA A Wave 5 mmHg  RA V Wave 3 mmHg  RA Mean 2 mmHg  RV Systolic Pressure 33 mmHg  RV Diastolic Pressure 2 mmHg  RV EDP 4 mmHg  PA Systolic Pressure 33 mmHg  PA Diastolic Pressure 13 mmHg  PA Mean 23 mmHg  PW A Wave 12 mmHg  PW V Wave 14 mmHg  PW Mean 11 mmHg  AO Systolic Pressure 123456 mmHg  AO Diastolic Pressure 65 mmHg  AO Mean 97 mmHg  LV Systolic Pressure 0000000 mmHg  LV Diastolic Pressure 6 mmHg  LV EDP 8 mmHg  AOp Systolic Pressure 123456 mmHg  AOp Diastolic Pressure 73 mmHg  AOp Mean Pressure 123456 mmHg  LVp Systolic Pressure 123XX123 mmHg  LVp Diastolic Pressure 7 mmHg  LVp EDP Pressure 10 mmHg  QP/QS 1  TPVR Index 6.14 HRUI  TSVR Index 25.9 HRUI  PVR SVR Ratio 0.13  TPVR/TSVR Ratio 0.24      ADDENDUM REPORT: 10/21/2020 08:17   CLINICAL DATA:  Pre-op transcatheter aortic valve replacement (TAVR)   EXAM: Cardiac TAVR CT   TECHNIQUE: The patient was scanned on a Siemens Force [id number] slice scanner. A 120 kV retrospective scan was triggered in the descending thoracic aorta at 111 HU's. Gantry rotation speed was 270 msecs and collimation was .9 mm. The 3D data set was reconstructed in 5% intervals of the R-R cycle. Systolic and diastolic phases were analyzed on a dedicated work station using  MPR, MIP and VRT modes. The patient received 164mL OMNIPAQUE IOHEXOL 350 MG/ML SOLN of contrast.   FINDINGS: Aortic Valve: Tricuspid aortic valve. Severely reduced cusp separation. Severely thickened, severely calcified aortic valve cusps.   AV calcium score: 2088   Virtual Basal Annulus Measurements:   Maximum/Minimum Diameter: 28.4 x 20.8 mm   Perimeter:  77 mm   Area:  446 mm2   No significant LVOT calcifications.   Based on these measurements, the annulus would be suitable for a 26 mm Sapien 3 valve.   Sinus of Valsalva Measurements:   Non-coronary:  30 mm    Right - coronary:  28 mm   Left - coronary:  30 mm   Sinus of Valsalva Height:   Left: 18.8 mm   Right: 19.5 mm   Aorta: Severe aortic atherosclerosis. Conventional 3 vessel branch pattern of aortic arch.   Sinotubular Junction:  25 mm   Ascending Thoracic Aorta:  30 mm   Aortic Arch:  26 mm   Descending Thoracic Aorta:  27 mm   Coronary Artery Height above Annulus:   Left Main: 13.7 mm   Right Coronary: 15.0 mm   Coronary Arteries: Normal coronary origin. Right dominance. The study was performed without use of NTG and insufficient for plaque evaluation.   Optimum Fluoroscopic Angle for Delivery: LAO 2, CAU 3   Mild mitral annular calcification.   No left atrial appendage thrombus.   IMPRESSION: 1. Tricuspid aortic valve. Severely reduced cusp separation. Severely thickened, severely calcified aortic valve cusps.   2.  AV calcium score: 2088   3. Annulus area: 446 mm2, no significant LVOT calcifications. The annulus would be suitable for a 26 mm Sapien 3 valve.   4.  Sufficient coronary artery heights.   5.  Severe aortic atherosclerosis in the descending thoracic aorta.   6. Optimum Fluoroscopic Angle for Delivery: LAO 2, CAU 3     Electronically Signed   By: Cherlynn Kaiser M.D.   On: 10/21/2020 08:17    Addended by Elouise Munroe, MD on 10/21/2020  8:20 AM    Study Result   Narrative & Impression  EXAM: OVER-READ INTERPRETATION  CT CHEST   The following report is an over-read performed by radiologist Dr. Vinnie Langton of [facility] [place] on 10/18/2020. This over-read does not include interpretation of cardiac or coronary anatomy or pathology. The coronary calcium score/coronary CTA interpretation by the cardiologist is attached.   COMPARISON:  Chest CT 11/05/2018.   FINDINGS: Extracardiac findings will be described separately under dictation for contemporaneously obtained CTA chest, abdomen and pelvis.    IMPRESSION: Please see separate dictation for contemporaneously obtained CTA chest, abdomen and pelvis dated 10/18/2020 for full description of relevant extracardiac findings.   Electronically Signed: By: Vinnie Langton M.D. On: 10/18/2020 12:07      CLINICAL DATA: 65 year old female with history of severe aortic  stenosis. Preprocedural study prior to potential transcatheter  aortic valve replacement (TAVR) procedure.  EXAM:  CT ANGIOGRAPHY CHEST, ABDOMEN AND PELVIS  TECHNIQUE:  Multidetector CT imaging through the chest, abdomen and pelvis was  performed using the standard protocol during bolus administration of  intravenous contrast. Multiplanar reconstructed images and MIPs were  obtained and reviewed to evaluate the vascular anatomy.  CONTRAST: 149mL OMNIPAQUE IOHEXOL 350 MG/ML SOLN  COMPARISON: Chest CT 11/05/2018. No prior CT of the abdomen and  pelvis.  FINDINGS:  CTA CHEST FINDINGS  Cardiovascular: Heart size is enlarged with concentric left  ventricular hypertrophy.  There is no significant pericardial fluid,  thickening or pericardial calcification. There is aortic  atherosclerosis, as well as atherosclerosis of the great vessels of  the mediastinum and the coronary arteries, including calcified  atherosclerotic plaque in the left main, left anterior descending,  left circumflex and right coronary arteries. Severe thickening  calcification of the aortic valve.  Mediastinum/Lymph Nodes: No pathologically enlarged mediastinal or  hilar lymph nodes. Esophagus is unremarkable in appearance. No  axillary lymphadenopathy.  Lungs/Pleura: No suspicious appearing pulmonary nodules or masses  are noted. No acute consolidative airspace disease. No pleural  effusions.  Musculoskeletal/Soft Tissues: There are no aggressive appearing  lytic or blastic lesions noted in the visualized portions of the  skeleton.  CTA ABDOMEN AND PELVIS FINDINGS  Hepatobiliary: No suspicious  cystic or solid hepatic lesions. No  intra or extrahepatic biliary ductal dilatation. Status post  cholecystectomy.  Pancreas: No pancreatic mass. No pancreatic ductal dilatation. No  pancreatic or peripancreatic fluid collections or inflammatory  changes.  Spleen: Unremarkable.  Adrenals/Urinary Tract: Mild multifocal cortical thinning in the  kidneys bilaterally. Exophytic 1.6 cm low-attenuation lesion in the  lateral aspect of the interpolar region of the left kidney,  compatible with a simple cyst. Several subcentimeter low-attenuation  lesions in both kidneys, too small to characterize, but  statistically likely to represent tiny cysts. No  hydroureteronephrosis. Urinary bladder is normal in appearance.  Bilateral adrenal glands are normal in appearance.  Stomach/Bowel: The appearance of the stomach is normal. There is no  pathologic dilatation of small bowel or colon. Normal appendix.  Vascular/Lymphatic: Severe atherosclerosis in the abdominal aorta  and pelvic vasculature, with vascular findings and measurements  pertinent to potential TAVR procedure, as detailed below. No  aneurysm or dissection noted in the abdominal or pelvic vasculature.  No lymphadenopathy noted in the abdomen or pelvis.  Reproductive: Uterus and ovaries are unremarkable in appearance.  Other: No significant volume of ascites. No pneumoperitoneum.  Musculoskeletal: There are no aggressive appearing lytic or blastic  lesions noted in the visualized portions of the skeleton.  VASCULAR MEASUREMENTS PERTINENT TO TAVR:  AORTA:  Minimal Aortic Diameter-6 x 5 mm (axial image 158 of series 16)  Severity of Aortic Calcification-severe  RIGHT PELVIS:  Right Common Iliac Artery -  Minimal Diameter-5.9 x 3.6 mm  Tortuosity-mild  Calcification-severe  Right External Iliac Artery -  Minimal Diameter-5.7 x 2.6 mm  Tortuosity - mild  Calcification-mild  Right Common Femoral Artery -  Minimal Diameter-5.0 x 4.5 mm   Tortuosity - mild  Calcification-mild  LEFT PELVIS:  Left Common Iliac Artery -  Minimal Diameter-6.1 x 3.3 mm  Tortuosity - mild  Calcification-severe  Left External Iliac Artery -  Minimal Diameter-5.3 x 4.2 mm  Tortuosity - mild  Calcification-mild  Left Common Femoral Artery -  Minimal Diameter-5.4 x 4.3 mm  Tortuosity - mild  Calcification-mild  Review of the MIP images confirms the above findings.  IMPRESSION:  1. Vascular findings and measurements pertinent to potential TAVR  procedure, as detailed above.  2. Severe thickening calcification of the aortic valve, compatible  with reported clinical history of severe aortic stenosis.  3. Aortic atherosclerosis, in addition to left main and 3 vessel  coronary artery disease.  4. Cardiomegaly with concentric left ventricular hypertrophy.  5. Additional incidental findings, as above.  Electronically Signed  By: Vinnie Langton M.D.  On: 10/18/2020 12:32    STS Risk Score: (updated 10/02/20) AVR alone Risk of Mortality: 1.971% Renal Failure: 0.550% Permanent  Stroke: 1.646% Prolonged Ventilation: 7.756% DSW Infection: 0.076% Reoperation: 2.530% Morbidity or Mortality: 11.985% Short Length of Stay: 41.135% Long Length of Stay: 4.234%   Impression:   This 65 year old woman has stage D, severe, symptomatic aortic stenosis with New York Heart Association class III symptoms of exertional fatigue and shortness of breath as well as substernal chest pressure consistent with chronic diastolic congestive heart failure.  She has also been having episodes of dizziness as well as some lower extremity edema.  I have personally reviewed her 2D echocardiograms, cardiac catheterization, and CTA studies.  Her echo in April 2022 showed a severely calcified aortic valve with a mean gradient of 40 mmHg consistent with severe aortic stenosis.  A repeat echo in May 2022 showed a lower mean gradient of 29 mmHg.  Her aortic valve is  severely calcified with restricted mobility and certainly looks like a severely stenotic valve.  Cardiac catheterization showed mild nonobstructive coronary disease.  The mean gradient across aortic valve was measured at 28 mmHg with a peak to peak gradient of 28 mmHg.  I agree that aortic valve replacement is indicated in this patient with a severely calcified aortic valve with restricted mobility and progressive symptoms consistent with severe aortic stenosis.  I think she would be a high risk candidate for open surgical aortic valve replacement due to her multiple comorbid risk factors as well as previous strokes and ongoing 1 pack/day smoking for the past 45 years with probable significant COPD.  I think transcatheter aortic valve replacement would be a reasonable alternative for treating her.  Her gated cardiac CTA shows anatomy suitable for transcatheter aortic valve replacement using a 23 mm SAPIEN 3 valve.  Her abdominal and pelvic CTA shows severe aortoiliac arterial disease that would preclude transfemoral insertion.  I think her left subclavian/axillary arteries are large enough caliber for insertion with minimal disease.    The patient was counseled at length regarding treatment alternatives for management of severe symptomatic aortic stenosis. The risks and benefits of surgical intervention has been discussed in detail. Long-term prognosis with medical therapy was discussed. Alternative approaches such as conventional surgical aortic valve replacement, transcatheter aortic valve replacement, and palliative medical therapy were compared and contrasted at length. This discussion was placed in the context of the patient's own specific clinical presentation and past medical history. All of their questions have been addressed.    Following the decision to proceed with transcatheter aortic valve replacement, a discussion was held regarding what types of management strategies would be attempted  intraoperatively in the event of life-threatening complications, including whether or not the patient would be considered a candidate for the use of cardiopulmonary bypass and/or conversion to open sternotomy for attempted surgical intervention.  I think she would be a candidate for emergent sternotomy to manage any intraoperative complications although she would certainly be at high risk.  The patient is aware of the fact that transient use of cardiopulmonary bypass may be necessary. The patient has been advised of a variety of complications that might develop including but not limited to risks of death, stroke, paravalvular leak, aortic dissection or other major vascular complications, aortic annulus rupture, device embolization, cardiac rupture or perforation, mitral regurgitation, acute myocardial infarction, arrhythmia, heart block or bradycardia requiring permanent pacemaker placement, congestive heart failure, respiratory failure, renal failure, pneumonia, infection, other late complications related to structural valve deterioration or migration, or other complications that might ultimately cause a temporary or permanent loss of functional independence or other long term  morbidity. The patient provides full informed consent for the procedure as described and all questions were answered.       Plan:     Left subclavian insertion of a SAPIEN 3 transcatheter aortic valve.       Gaye Pollack, MD

## 2020-11-27 NOTE — Anesthesia Preprocedure Evaluation (Addendum)
Anesthesia Evaluation  Patient identified by MRN, date of birth, ID band Patient awake    Reviewed: Allergy & Precautions, NPO status , Patient's Chart, lab work & pertinent test results  Airway Mallampati: II  TM Distance: >3 FB Neck ROM: Full    Dental  (+) Edentulous Upper, Edentulous Lower   Pulmonary asthma , sleep apnea , COPD,  COPD inhaler, Current Smoker and Patient abstained from smoking.,     + decreased breath sounds      Cardiovascular hypertension, Pt. on medications and Pt. on home beta blockers + CAD  + Valvular Problems/Murmurs AS  Rhythm:Regular Rate:Normal + Systolic murmurs    Neuro/Psych Anxiety Depression Bipolar Disorder CVA (05/22)    GI/Hepatic Neg liver ROS, GERD  ,  Endo/Other  diabetes, Type 2, Insulin Dependent, Oral Hypoglycemic AgentsHypothyroidism Hyperthyroidism   Renal/GU negative Renal ROS  negative genitourinary   Musculoskeletal  (+) Arthritis , Fibromyalgia -, narcotic dependent  Abdominal Normal abdominal exam  (+)   Peds  Hematology  (+) anemia ,   Anesthesia Other Findings   Reproductive/Obstetrics                           Anesthesia Physical Anesthesia Plan  ASA: 4  Anesthesia Plan: General   Post-op Pain Management:    Induction: Intravenous  PONV Risk Score and Plan: 2 and Ondansetron, Dexamethasone, Treatment may vary due to age or medical condition and Midazolam  Airway Management Planned: Mask and Oral ETT  Additional Equipment: Arterial line  Intra-op Plan:   Post-operative Plan: Extubation in OR  Informed Consent: I have reviewed the patients History and Physical, chart, labs and discussed the procedure including the risks, benefits and alternatives for the proposed anesthesia with the patient or authorized representative who has indicated his/her understanding and acceptance.     Dental advisory given  Plan Discussed with:  CRNA  Anesthesia Plan Comments: (PAT note written 11/27/2020 by Myra Gianotti, PA-C. Lab Results      Component                Value               Date                      WBC                      10.5                11/24/2020                HGB                      15.7 (H)            11/24/2020                HCT                      48.7 (H)            11/24/2020                MCV                      89.9                11/24/2020  PLT                      362                 11/24/2020           Lab Results      Component                Value               Date                      NA                       129 (L)             11/24/2020                K                        4.9                 11/24/2020                CO2                      22                  11/24/2020                GLUCOSE                  268 (H)             11/24/2020                BUN                      22                  11/24/2020                CREATININE               0.80                11/24/2020                CALCIUM                  9.2                 11/24/2020                EGFR                     61                  10/09/2020                GFRNONAA                 >60                 11/24/2020            CT Coronary 10/18/20: IMPRESSION: 1. Tricuspid aortic valve. Severely reduced cusp separation. Severely thickened, severely calcified aortic valve cusps. 2. AV  calcium score: 2088 3. Annulus area: 446 mm2, no significant LVOT calcifications. The annulus would be suitable for a 26 mm Sapien 3 valve. 4. Sufficient coronary artery heights. 5. Severe aortic atherosclerosis in the descending thoracic aorta. 6. Optimum Fluoroscopic Angle for Delivery: LAO 2, CAU 3  Cardiac cath 06/14/20: ? Prox RCA lesion is 30% stenosed. ? Mid RCA lesion is 30% stenosed. ? 2nd Mrg lesion is 30% stenosed. ? Ramus lesion is 30% stenosed.  1. Mild non-obstructive CAD 2.  Severe aortic stenosis by echo (by cath mean gradient 27.7 mmHg, peak to peak gradient 28 mmHg)  Echo 06/01/20: IMPRESSIONS  1. Left ventricular ejection fraction, by estimation, is 60 to 65%. The  left ventricle has normal function. The left ventricle has no regional  wall motion abnormalities. There is moderate left ventricular hypertrophy.  Left ventricular diastolic  parameters are consistent with Grade I diastolic dysfunction (impaired  relaxation).  2. Right ventricular systolic function is normal. The right ventricular  size is normal. There is normal pulmonary artery systolic pressure.  3. Left atrial size was moderately dilated.  4. The mitral valve is grossly normal. Mild mitral valve regurgitation.  5. The aortic valve was not well visualized. Aortic valve regurgitation  is trivial.  6. The inferior vena cava is normal in size with greater than 50%  respiratory variability, suggesting right atrial pressure of 3 mmHg.  - Comparison Echo 04/03/20: LVEF 60-65%, no regional wall motion abnormality, grade 1 DD, moderate AI, severe AS, AV peak velocity 4.06 m/s, mean gradient 33.37 mmHg, peak gradient 65.91 mmHg, AVA 0.9 cm2)      Anesthesia Quick Evaluation

## 2020-11-28 ENCOUNTER — Other Ambulatory Visit: Payer: Self-pay

## 2020-11-28 ENCOUNTER — Inpatient Hospital Stay (HOSPITAL_COMMUNITY)
Admission: RE | Admit: 2020-11-28 | Discharge: 2020-11-28 | Disposition: A | Discharge status: Home / Self Care | Payer: Medicare Other | Source: Ambulatory Visit | Attending: Cardiovascular Disease | Admitting: Cardiovascular Disease

## 2020-11-28 ENCOUNTER — Inpatient Hospital Stay (HOSPITAL_COMMUNITY): Payer: Medicare Other | Admitting: Certified Registered"

## 2020-11-28 ENCOUNTER — Inpatient Hospital Stay (HOSPITAL_COMMUNITY): Payer: Medicare Other | Admitting: Vascular Surgery

## 2020-11-28 ENCOUNTER — Inpatient Hospital Stay (HOSPITAL_COMMUNITY)
Admission: RE | Admit: 2020-11-28 | Discharge: 2020-11-29 | DRG: 267 | Disposition: A | Discharge status: Home / Self Care | Payer: Medicare Other | Attending: Surgery | Admitting: Surgery

## 2020-11-28 ENCOUNTER — Encounter (HOSPITAL_COMMUNITY): Payer: Self-pay | Admitting: Cardiovascular Disease

## 2020-11-28 ENCOUNTER — Other Ambulatory Visit: Payer: Self-pay | Admitting: Cardiology

## 2020-11-28 ENCOUNTER — Encounter (HOSPITAL_COMMUNITY)
Admission: RE | Disposition: A | Discharge status: Home / Self Care | Payer: Self-pay | Source: Home / Self Care | Attending: Surgery

## 2020-11-28 DIAGNOSIS — Z720 Tobacco use: Secondary | ICD-10-CM | POA: Diagnosis present

## 2020-11-28 DIAGNOSIS — I69341 Monoplegia of lower limb following cerebral infarction affecting right dominant side: Secondary | ICD-10-CM

## 2020-11-28 DIAGNOSIS — G4733 Obstructive sleep apnea (adult) (pediatric): Secondary | ICD-10-CM | POA: Diagnosis present

## 2020-11-28 DIAGNOSIS — M150 Primary generalized (osteo)arthritis: Secondary | ICD-10-CM

## 2020-11-28 DIAGNOSIS — J449 Chronic obstructive pulmonary disease, unspecified: Secondary | ICD-10-CM | POA: Diagnosis present

## 2020-11-28 DIAGNOSIS — G9389 Other specified disorders of brain: Secondary | ICD-10-CM | POA: Diagnosis present

## 2020-11-28 DIAGNOSIS — I2510 Atherosclerotic heart disease of native coronary artery without angina pectoris: Secondary | ICD-10-CM | POA: Diagnosis present

## 2020-11-28 DIAGNOSIS — I5032 Chronic diastolic (congestive) heart failure: Secondary | ICD-10-CM | POA: Diagnosis present

## 2020-11-28 DIAGNOSIS — Z952 Presence of prosthetic heart valve: Secondary | ICD-10-CM | POA: Diagnosis not present

## 2020-11-28 DIAGNOSIS — Z7984 Long term (current) use of oral hypoglycemic drugs: Secondary | ICD-10-CM

## 2020-11-28 DIAGNOSIS — F17210 Nicotine dependence, cigarettes, uncomplicated: Secondary | ICD-10-CM | POA: Diagnosis present

## 2020-11-28 DIAGNOSIS — Z8249 Family history of ischemic heart disease and other diseases of the circulatory system: Secondary | ICD-10-CM

## 2020-11-28 DIAGNOSIS — Z006 Encounter for examination for normal comparison and control in clinical research program: Secondary | ICD-10-CM

## 2020-11-28 DIAGNOSIS — G894 Chronic pain syndrome: Secondary | ICD-10-CM | POA: Diagnosis present

## 2020-11-28 DIAGNOSIS — N183 Chronic kidney disease, stage 3 unspecified: Secondary | ICD-10-CM | POA: Diagnosis present

## 2020-11-28 DIAGNOSIS — I4510 Unspecified right bundle-branch block: Secondary | ICD-10-CM

## 2020-11-28 DIAGNOSIS — Z79899 Other long term (current) drug therapy: Secondary | ICD-10-CM | POA: Diagnosis not present

## 2020-11-28 DIAGNOSIS — E785 Hyperlipidemia, unspecified: Secondary | ICD-10-CM | POA: Diagnosis present

## 2020-11-28 DIAGNOSIS — F3132 Bipolar disorder, current episode depressed, moderate: Secondary | ICD-10-CM | POA: Diagnosis present

## 2020-11-28 DIAGNOSIS — I10 Essential (primary) hypertension: Secondary | ICD-10-CM | POA: Diagnosis present

## 2020-11-28 DIAGNOSIS — F419 Anxiety disorder, unspecified: Secondary | ICD-10-CM | POA: Diagnosis present

## 2020-11-28 DIAGNOSIS — I350 Nonrheumatic aortic (valve) stenosis: Secondary | ICD-10-CM

## 2020-11-28 DIAGNOSIS — Z8673 Personal history of transient ischemic attack (TIA), and cerebral infarction without residual deficits: Secondary | ICD-10-CM

## 2020-11-28 DIAGNOSIS — M797 Fibromyalgia: Secondary | ICD-10-CM | POA: Diagnosis present

## 2020-11-28 DIAGNOSIS — Z79890 Hormone replacement therapy (postmenopausal): Secondary | ICD-10-CM | POA: Diagnosis not present

## 2020-11-28 DIAGNOSIS — E1169 Type 2 diabetes mellitus with other specified complication: Secondary | ICD-10-CM | POA: Diagnosis present

## 2020-11-28 DIAGNOSIS — E782 Mixed hyperlipidemia: Secondary | ICD-10-CM | POA: Diagnosis present

## 2020-11-28 DIAGNOSIS — I110 Hypertensive heart disease with heart failure: Secondary | ICD-10-CM | POA: Diagnosis present

## 2020-11-28 DIAGNOSIS — J439 Emphysema, unspecified: Secondary | ICD-10-CM | POA: Diagnosis present

## 2020-11-28 DIAGNOSIS — Z9049 Acquired absence of other specified parts of digestive tract: Secondary | ICD-10-CM | POA: Diagnosis not present

## 2020-11-28 DIAGNOSIS — Z954 Presence of other heart-valve replacement: Secondary | ICD-10-CM | POA: Diagnosis not present

## 2020-11-28 DIAGNOSIS — Z79891 Long term (current) use of opiate analgesic: Secondary | ICD-10-CM | POA: Diagnosis not present

## 2020-11-28 HISTORY — DX: Presence of prosthetic heart valve: Z95.2

## 2020-11-28 HISTORY — PX: TEE WITHOUT CARDIOVERSION: SHX5443

## 2020-11-28 LAB — GLUCOSE, CAPILLARY
Glucose-Capillary: 390 mg/dL — ABNORMAL HIGH (ref 70–99)
Glucose-Capillary: 301 mg/dL — ABNORMAL HIGH (ref 70–99)
Glucose-Capillary: 208 mg/dL — ABNORMAL HIGH (ref 70–99)
Glucose-Capillary: 191 mg/dL — ABNORMAL HIGH (ref 70–99)

## 2020-11-28 LAB — POCT I-STAT, CHEM 8
BUN: 22 mg/dL (ref 8–23)
BUN: 21 mg/dL (ref 8–23)
BUN: 21 mg/dL (ref 8–23)
Calcium, Ion: 1.22 mmol/L (ref 1.15–1.40)
Calcium, Ion: 1.24 mmol/L (ref 1.15–1.40)
Calcium, Ion: 1.19 mmol/L (ref 1.15–1.40)
Chloride: 99 mmol/L (ref 98–111)
Chloride: 99 mmol/L (ref 98–111)
Chloride: 101 mmol/L (ref 98–111)
Creatinine, Ser: 0.3 mg/dL — ABNORMAL LOW (ref 0.44–1.00)
Creatinine, Ser: 0.5 mg/dL (ref 0.44–1.00)
Creatinine, Ser: 0.6 mg/dL (ref 0.44–1.00)
Glucose, Bld: 220 mg/dL — ABNORMAL HIGH (ref 70–99)
Glucose, Bld: 174 mg/dL — ABNORMAL HIGH (ref 70–99)
Glucose, Bld: 161 mg/dL — ABNORMAL HIGH (ref 70–99)
HCT: 47 % — ABNORMAL HIGH (ref 36.0–46.0)
HCT: 43 % (ref 36.0–46.0)
HCT: 46 % (ref 36.0–46.0)
Hemoglobin: 16 g/dL — ABNORMAL HIGH (ref 12.0–15.0)
Hemoglobin: 14.6 g/dL (ref 12.0–15.0)
Hemoglobin: 15.6 g/dL — ABNORMAL HIGH (ref 12.0–15.0)
Potassium: 3.6 mmol/L (ref 3.5–5.1)
Potassium: 3.9 mmol/L (ref 3.5–5.1)
Potassium: 3.8 mmol/L (ref 3.5–5.1)
Sodium: 137 mmol/L (ref 135–145)
Sodium: 136 mmol/L (ref 135–145)
Sodium: 137 mmol/L (ref 135–145)
TCO2: 27 mmol/L (ref 22–32)
TCO2: 27 mmol/L (ref 22–32)
TCO2: 26 mmol/L (ref 22–32)

## 2020-11-28 LAB — POCT I-STAT 7, (LYTES, BLD GAS, ICA,H+H)
Acid-Base Excess: 4 mmol/L — ABNORMAL HIGH (ref 0.0–2.0)
Bicarbonate: 30.3 mmol/L — ABNORMAL HIGH (ref 20.0–28.0)
Calcium, Ion: 1.22 mmol/L (ref 1.15–1.40)
HCT: 44 % (ref 36.0–46.0)
Hemoglobin: 15 g/dL (ref 12.0–15.0)
O2 Saturation: 100 %
Potassium: 3.6 mmol/L (ref 3.5–5.1)
Sodium: 136 mmol/L (ref 135–145)
TCO2: 32 mmol/L (ref 22–32)
pCO2 arterial: 51.5 mmHg — ABNORMAL HIGH (ref 32.0–48.0)
pH, Arterial: 7.378 (ref 7.350–7.450)
pO2, Arterial: 468 mmHg — ABNORMAL HIGH (ref 83.0–108.0)

## 2020-11-28 LAB — ECHO TEE
AR max vel: 2.09 cm2
AV Area VTI: 2.59 cm2
AV Area mean vel: 2.3 cm2
AV Mean grad: 10 mmHg
AV Peak grad: 20.3 mmHg
Ao pk vel: 2.25 m/s
P 1/2 time: 334 msec

## 2020-11-28 LAB — POCT ACTIVATED CLOTTING TIME
Activated Clotting Time: 116 seconds
Activated Clotting Time: 283 seconds
Activated Clotting Time: 127 seconds

## 2020-11-28 LAB — ABO/RH: ABO/RH(D): A POS

## 2020-11-28 SURGERY — IMPLANTATION, AORTIC VALVE, TRANSCATHETER, SUBCLAVIAN ARTERY APPROACH
Anesthesia: General | Laterality: Left

## 2020-11-28 MED ORDER — MECLIZINE HCL 25 MG PO TABS
25.0000 mg | ORAL_TABLET | Freq: Two times a day (BID) | ORAL | Status: DC
  Administered 2020-11-28 – 2020-11-29 (×2): 25 mg via ORAL
  Filled 2020-11-28 (×4): qty 1

## 2020-11-28 MED ORDER — HYDRALAZINE HCL 20 MG/ML IJ SOLN
INTRAMUSCULAR | Status: AC
  Filled 2020-11-28: qty 1

## 2020-11-28 MED ORDER — VENLAFAXINE HCL ER 75 MG PO CP24
75.0000 mg | ORAL_CAPSULE | Freq: Every day | ORAL | Status: DC
  Administered 2020-11-29: 75 mg via ORAL
  Filled 2020-11-28: qty 1

## 2020-11-28 MED ORDER — ONDANSETRON HCL 4 MG/2ML IJ SOLN
4.0000 mg | Freq: Four times a day (QID) | INTRAMUSCULAR | Status: DC | PRN
  Administered 2020-11-28 – 2020-11-29 (×2): 4 mg via INTRAVENOUS
  Filled 2020-11-28 (×2): qty 2

## 2020-11-28 MED ORDER — SUGAMMADEX SODIUM 200 MG/2ML IV SOLN
INTRAVENOUS | Status: DC | PRN
  Administered 2020-11-28: 200 mg via INTRAVENOUS

## 2020-11-28 MED ORDER — LEVOTHYROXINE SODIUM 100 MCG PO TABS
100.0000 ug | ORAL_TABLET | Freq: Every day | ORAL | Status: DC
  Administered 2020-11-29: 100 ug via ORAL
  Filled 2020-11-28: qty 1

## 2020-11-28 MED ORDER — LIDOCAINE 2% (20 MG/ML) 5 ML SYRINGE
INTRAMUSCULAR | Status: DC | PRN
  Administered 2020-11-28: 60 mg via INTRAVENOUS

## 2020-11-28 MED ORDER — PNEUMOCOCCAL VAC POLYVALENT 25 MCG/0.5ML IJ INJ
0.5000 mL | INJECTION | INTRAMUSCULAR | Status: DC | PRN
  Filled 2020-11-28: qty 0.5

## 2020-11-28 MED ORDER — LACTATED RINGERS IV SOLN: INTRAVENOUS | Status: DC

## 2020-11-28 MED ORDER — DEXAMETHASONE SODIUM PHOSPHATE 10 MG/ML IJ SOLN: INTRAMUSCULAR | Status: DC | PRN

## 2020-11-28 MED ORDER — HYDRALAZINE HCL 20 MG/ML IJ SOLN
10.0000 mg | Freq: Once | INTRAMUSCULAR | Status: AC
  Administered 2020-11-28: 10 mg via INTRAVENOUS

## 2020-11-28 MED ORDER — ORAL CARE MOUTH RINSE: 15.0000 mL | Freq: Once | OROMUCOSAL | Status: DC

## 2020-11-28 MED ORDER — INSULIN ASPART 100 UNIT/ML IJ SOLN
0.0000 [IU] | Freq: Three times a day (TID) | INTRAMUSCULAR | Status: DC
  Administered 2020-11-28: 4 [IU] via SUBCUTANEOUS
  Administered 2020-11-28: 8 [IU] via SUBCUTANEOUS
  Administered 2020-11-29: 16 [IU] via SUBCUTANEOUS
  Administered 2020-11-29: 2 [IU] via SUBCUTANEOUS

## 2020-11-28 MED ORDER — OXYCODONE HCL ER 15 MG PO T12A
30.0000 mg | EXTENDED_RELEASE_TABLET | Freq: Two times a day (BID) | ORAL | Status: DC
  Administered 2020-11-28 – 2020-11-29 (×2): 30 mg via ORAL
  Filled 2020-11-28 (×2): qty 2

## 2020-11-28 MED ORDER — SODIUM CHLORIDE 0.9% FLUSH
3.0000 mL | Freq: Two times a day (BID) | INTRAVENOUS | Status: DC
  Administered 2020-11-28 – 2020-11-29 (×2): 3 mL via INTRAVENOUS

## 2020-11-28 MED ORDER — CLEVIDIPINE BUTYRATE 0.5 MG/ML IV EMUL
INTRAVENOUS | Status: AC
  Filled 2020-11-28: qty 50

## 2020-11-28 MED ORDER — AMLODIPINE BESYLATE 10 MG PO TABS
10.0000 mg | ORAL_TABLET | Freq: Every day | ORAL | Status: DC
  Administered 2020-11-29: 10 mg via ORAL
  Filled 2020-11-28: qty 1

## 2020-11-28 MED ORDER — INSULIN ASPART 100 UNIT/ML IJ SOLN
8.0000 [IU] | Freq: Once | INTRAMUSCULAR | Status: AC
  Administered 2020-11-28: 8 [IU] via SUBCUTANEOUS
  Filled 2020-11-28: qty 0.08

## 2020-11-28 MED ORDER — CHLORHEXIDINE GLUCONATE 0.12 % MT SOLN
OROMUCOSAL | Status: AC
  Administered 2020-11-28: 15 mL via OROMUCOSAL
  Filled 2020-11-28: qty 15

## 2020-11-28 MED ORDER — INSULIN ASPART 100 UNIT/ML IJ SOLN
INTRAMUSCULAR | Status: AC
  Filled 2020-11-28: qty 1

## 2020-11-28 MED ORDER — ONDANSETRON HCL 4 MG/2ML IJ SOLN
INTRAMUSCULAR | Status: DC | PRN
  Administered 2020-11-28: 4 mg via INTRAVENOUS

## 2020-11-28 MED ORDER — HEPARIN SODIUM (PORCINE) 1000 UNIT/ML IJ SOLN
INTRAMUSCULAR | Status: DC | PRN
  Administered 2020-11-28: 11000 [IU] via INTRAVENOUS

## 2020-11-28 MED ORDER — ACETAMINOPHEN 650 MG RE SUPP: 650.0000 mg | Freq: Four times a day (QID) | RECTAL | Status: DC | PRN

## 2020-11-28 MED ORDER — PROPOFOL 10 MG/ML IV BOLUS
INTRAVENOUS | Status: DC | PRN
  Administered 2020-11-28: 140 mg via INTRAVENOUS
  Administered 2020-11-28: 50 mg via INTRAVENOUS

## 2020-11-28 MED ORDER — HEPARIN (PORCINE) IN NACL 1000-0.9 UT/500ML-% IV SOLN
INTRAVENOUS | Status: DC | PRN
  Administered 2020-11-28 (×3): 500 mL

## 2020-11-28 MED ORDER — LEVOTHYROXINE SODIUM 25 MCG PO TABS
25.0000 ug | ORAL_TABLET | Freq: Every day | ORAL | Status: DC
  Administered 2020-11-29: 25 ug via ORAL
  Filled 2020-11-28: qty 1

## 2020-11-28 MED ORDER — SODIUM CHLORIDE 0.9 % IV SOLN: 250.0000 mL | INTRAVENOUS | Status: DC | PRN

## 2020-11-28 MED ORDER — SODIUM CHLORIDE 0.9% FLUSH: 3.0000 mL | INTRAVENOUS | Status: DC | PRN

## 2020-11-28 MED ORDER — CLEVIDIPINE BUTYRATE 0.5 MG/ML IV EMUL
INTRAVENOUS | Status: DC | PRN
  Administered 2020-11-28: 2 mg/h via INTRAVENOUS
  Administered 2020-11-28: 9 mg/h via INTRAVENOUS

## 2020-11-28 MED ORDER — DOXEPIN HCL 50 MG PO CAPS
300.0000 mg | ORAL_CAPSULE | Freq: Every day | ORAL | Status: DC
  Administered 2020-11-28 – 2020-11-29 (×2): 300 mg via ORAL
  Filled 2020-11-28 (×2): qty 6
  Filled 2020-11-28: qty 3
  Filled 2020-11-28: qty 6

## 2020-11-28 MED ORDER — ACETAMINOPHEN 325 MG PO TABS
650.0000 mg | ORAL_TABLET | Freq: Four times a day (QID) | ORAL | Status: DC | PRN
  Administered 2020-11-29 (×2): 650 mg via ORAL
  Filled 2020-11-28 (×2): qty 2

## 2020-11-28 MED ORDER — CHLORHEXIDINE GLUCONATE 0.12 % MT SOLN: 15.0000 mL | Freq: Once | OROMUCOSAL | Status: AC

## 2020-11-28 MED ORDER — AMITRIPTYLINE HCL 50 MG PO TABS
50.0000 mg | ORAL_TABLET | Freq: Every day | ORAL | Status: DC
  Administered 2020-11-28: 50 mg via ORAL
  Filled 2020-11-28: qty 1

## 2020-11-28 MED ORDER — IOHEXOL 350 MG/ML SOLN
INTRAVENOUS | Status: DC | PRN
  Administered 2020-11-28: 80 mL

## 2020-11-28 MED ORDER — CEFAZOLIN SODIUM-DEXTROSE 2-4 GM/100ML-% IV SOLN
2.0000 g | Freq: Three times a day (TID) | INTRAVENOUS | Status: AC
  Administered 2020-11-28 – 2020-11-29 (×2): 2 g via INTRAVENOUS
  Filled 2020-11-28 (×2): qty 100

## 2020-11-28 MED ORDER — FENTANYL CITRATE (PF) 100 MCG/2ML IJ SOLN: INTRAMUSCULAR | Status: DC | PRN

## 2020-11-28 MED ORDER — INSULIN ASPART 100 UNIT/ML IJ SOLN
INTRAMUSCULAR | Status: AC
  Administered 2020-11-28: 10 [IU] via SUBCUTANEOUS
  Filled 2020-11-28: qty 1

## 2020-11-28 MED ORDER — ARIPIPRAZOLE 2 MG PO TABS
10.0000 mg | ORAL_TABLET | Freq: Every day | ORAL | Status: DC
  Administered 2020-11-28 – 2020-11-29 (×2): 10 mg via ORAL
  Filled 2020-11-28 (×2): qty 5

## 2020-11-28 MED ORDER — HYDRALAZINE HCL 50 MG PO TABS
50.0000 mg | ORAL_TABLET | Freq: Four times a day (QID) | ORAL | Status: DC
  Administered 2020-11-28 – 2020-11-29 (×4): 50 mg via ORAL
  Filled 2020-11-28 (×5): qty 1

## 2020-11-28 MED ORDER — INSULIN ASPART 100 UNIT/ML IJ SOLN: 10.0000 [IU] | Freq: Once | INTRAMUSCULAR | Status: AC

## 2020-11-28 MED ORDER — SODIUM CHLORIDE 0.9 % IV SOLN: INTRAVENOUS | Status: AC

## 2020-11-28 MED ORDER — ASPIRIN EC 81 MG PO TBEC
81.0000 mg | DELAYED_RELEASE_TABLET | Freq: Every day | ORAL | Status: DC
  Administered 2020-11-28 – 2020-11-29 (×2): 81 mg via ORAL
  Filled 2020-11-28 (×2): qty 1

## 2020-11-28 MED ORDER — CHLORHEXIDINE GLUCONATE 0.12 % MT SOLN: 15.0000 mL | Freq: Once | OROMUCOSAL | Status: DC

## 2020-11-28 MED ORDER — PRAMIPEXOLE DIHYDROCHLORIDE 0.25 MG PO TABS
0.5000 mg | ORAL_TABLET | Freq: Every day | ORAL | Status: DC
  Administered 2020-11-28: 0.5 mg via ORAL
  Filled 2020-11-28 (×3): qty 2

## 2020-11-28 MED ORDER — ROCURONIUM BROMIDE 10 MG/ML (PF) SYRINGE
PREFILLED_SYRINGE | INTRAVENOUS | Status: DC | PRN
  Administered 2020-11-28: 10 mg via INTRAVENOUS
  Administered 2020-11-28: 20 mg via INTRAVENOUS
  Administered 2020-11-28: 50 mg via INTRAVENOUS

## 2020-11-28 MED ORDER — CHLORHEXIDINE GLUCONATE 4 % EX LIQD: 30.0000 mL | CUTANEOUS | Status: DC

## 2020-11-28 MED ORDER — METOPROLOL TARTRATE 25 MG PO TABS
25.0000 mg | ORAL_TABLET | Freq: Two times a day (BID) | ORAL | Status: DC
  Administered 2020-11-28 – 2020-11-29 (×3): 25 mg via ORAL
  Filled 2020-11-28 (×3): qty 1

## 2020-11-28 MED ORDER — SODIUM CHLORIDE 0.9 % IV SOLN: INTRAVENOUS | Status: DC

## 2020-11-28 MED ORDER — ROSUVASTATIN CALCIUM 20 MG PO TABS
40.0000 mg | ORAL_TABLET | Freq: Every day | ORAL | Status: DC
  Administered 2020-11-28 – 2020-11-29 (×2): 40 mg via ORAL
  Filled 2020-11-28 (×2): qty 2

## 2020-11-28 MED ORDER — CHLORHEXIDINE GLUCONATE 4 % EX LIQD: 60.0000 mL | Freq: Once | CUTANEOUS | Status: DC

## 2020-11-28 MED ORDER — PHENYLEPHRINE 40 MCG/ML (10ML) SYRINGE FOR IV PUSH (FOR BLOOD PRESSURE SUPPORT)
PREFILLED_SYRINGE | INTRAVENOUS | Status: DC | PRN
  Administered 2020-11-28 (×2): 80 ug via INTRAVENOUS

## 2020-11-28 MED ORDER — PROTAMINE SULFATE 10 MG/ML IV SOLN
INTRAVENOUS | Status: DC | PRN
  Administered 2020-11-28 (×2): 30 mg via INTRAVENOUS
  Administered 2020-11-28: 20 mg via INTRAVENOUS
  Administered 2020-11-28: 30 mg via INTRAVENOUS

## 2020-11-28 MED ORDER — FENTANYL CITRATE (PF) 100 MCG/2ML IJ SOLN
INTRAMUSCULAR | Status: DC | PRN
  Administered 2020-11-28 (×4): 50 ug via INTRAVENOUS

## 2020-11-28 MED ORDER — MIDAZOLAM HCL 2 MG/2ML IJ SOLN
INTRAMUSCULAR | Status: DC | PRN
  Administered 2020-11-28: 2 mg via INTRAVENOUS

## 2020-11-28 MED ORDER — AMLODIPINE BESYLATE 10 MG PO TABS
10.0000 mg | ORAL_TABLET | Freq: Once | ORAL | Status: AC
  Administered 2020-11-28: 10 mg via ORAL
  Filled 2020-11-28: qty 1

## 2020-11-28 MED ORDER — HEPARIN (PORCINE) IN NACL 1000-0.9 UT/500ML-% IV SOLN
INTRAVENOUS | Status: AC
  Filled 2020-11-28: qty 1500

## 2020-11-28 MED ORDER — AMLODIPINE BESYLATE 5 MG PO TABS
ORAL_TABLET | ORAL | Status: AC
  Filled 2020-11-28: qty 2

## 2020-11-28 MED ORDER — NITROGLYCERIN IN D5W 200-5 MCG/ML-% IV SOLN: 0.0000 ug/min | INTRAVENOUS | Status: DC

## 2020-11-28 MED ORDER — SODIUM CHLORIDE 0.9 % IR SOLN
Status: DC | PRN
  Administered 2020-11-28: 1000 mL

## 2020-11-28 SURGICAL SUPPLY — 26 items
CABLE ADAPT PACING TEMP 12FT (ADAPTER) ×2 IMPLANT
CATH 23 ULTRA DELIVERY (CATHETERS) ×3 IMPLANT
CATH DIAG 6FR PIGTAIL ANGLED (CATHETERS) ×4 IMPLANT
CATH INFINITI 6F AL1 (CATHETERS) ×2 IMPLANT
CATH S G BIP PACING (CATHETERS) ×3 IMPLANT
CLOSURE MYNX CONTROL 6F/7F (Vascular Products) ×2 IMPLANT
CRIMPER (MISCELLANEOUS) ×3 IMPLANT
DEVICE INFLATION ATRION QL2530 (MISCELLANEOUS) ×2 IMPLANT
ELECT DEFIB PAD ADLT CADENCE (PAD) ×3 IMPLANT
GUIDEWIRE SAFE TJ AMPLATZ EXST (WIRE) ×2 IMPLANT
KIT HEART LEFT (KITS) ×2 IMPLANT
KIT MICROPUNCTURE NIT STIFF (SHEATH) ×2 IMPLANT
KIT SAPIAN 3 ULTRA RESILIA 23 (Valve) ×2 IMPLANT
PACK CARDIAC CATHETERIZATION (CUSTOM PROCEDURE TRAY) ×2 IMPLANT
SHEATH BRITE TIP 7FR 35CM (SHEATH) ×3 IMPLANT
SHEATH INTRODUCER SET 20-26 (SHEATH) ×2 IMPLANT
SHEATH PINNACLE 6F 10CM (SHEATH) ×6 IMPLANT
STOPCOCK MORSE 400PSI 3WAY (MISCELLANEOUS) ×6 IMPLANT
SYR MEDRAD MARK V 150ML (SYRINGE) ×2 IMPLANT
TRANSDUCER W/STOPCOCK (MISCELLANEOUS) ×4 IMPLANT
TUBING ART PRESS 72  MALE/FEM (TUBING) ×4
TUBING ART PRESS 72 MALE/FEM (TUBING) IMPLANT
TUBING CONTRAST HIGH PRESS 48 (TUBING) ×3 IMPLANT
WIRE EMERALD 3MM-J .035X150CM (WIRE) ×2 IMPLANT
WIRE EMERALD 3MM-J .035X260CM (WIRE) ×4 IMPLANT
WIRE EMERALD ST .035X260CM (WIRE) ×2 IMPLANT

## 2020-11-28 NOTE — Op Note (Signed)
[facility]   MULTIDISCIPLINARY HEART VALVE TEAM   TAVR OPERATIVE NOTE   Date of Procedure:  11/28/2020  Preoperative Diagnosis: Severe Aortic Stenosis   Postoperative Diagnosis: Same   Procedure:   Transcatheter Aortic Valve Replacement - Left subclavian artery approach  Edwards Sapien 3 Ultra RSL THV (size 23 mm, model # 9755RSL, serial # [id number])   Co-Surgeons:  Alleen Borne, MD and [name], MD   Anesthesiologist:  G. Stoltzfus, DO  Echocardiographer:  M. Croitoru, MD  Pre-operative Echo Findings: Severe aortic stenosis Normal left ventricular systolic function  Post-operative Echo Findings: No paravalvular leak Normal left ventricular systolic function   BRIEF CLINICAL NOTE AND INDICATIONS FOR SURGERY  This 65 year old woman has stage D, severe, symptomatic aortic stenosis with New York Heart Association class III symptoms of exertional fatigue and shortness of breath as well as substernal chest pressure consistent with chronic diastolic congestive heart failure.  She has also been having episodes of dizziness as well as some lower extremity edema.  I have personally reviewed her 2D echocardiograms, cardiac catheterization, and CTA studies.  Her echo in April 2022 showed a severely calcified aortic valve with a mean gradient of 40 mmHg consistent with severe aortic stenosis.  A repeat echo in May 2022 showed a lower mean gradient of 29 mmHg.  Her aortic valve is severely calcified with restricted mobility and certainly looks like a severely stenotic valve.  Cardiac catheterization showed mild nonobstructive coronary disease.  The mean gradient across aortic valve was measured at 28 mmHg with a peak to peak gradient of 28 mmHg.  I agree that aortic valve replacement is indicated in this patient with a severely calcified aortic valve with restricted mobility and progressive symptoms consistent with severe aortic stenosis.  I think she would be a  high risk candidate for open surgical aortic valve replacement due to her multiple comorbid risk factors as well as previous strokes and ongoing 1 pack/day smoking for the past 45 years with probable significant COPD.  I think transcatheter aortic valve replacement would be a reasonable alternative for treating her.  Her gated cardiac CTA shows anatomy suitable for transcatheter aortic valve replacement using a 23 mm SAPIEN 3 valve.  Her abdominal and pelvic CTA shows severe aortoiliac arterial disease that would preclude transfemoral insertion.  I think her left subclavian/axillary arteries are large enough caliber for insertion with minimal disease.    The patient was counseled at length regarding treatment alternatives for management of severe symptomatic aortic stenosis. The risks and benefits of surgical intervention has been discussed in detail. Long-term prognosis with medical therapy was discussed. Alternative approaches such as conventional surgical aortic valve replacement, transcatheter aortic valve replacement, and palliative medical therapy were compared and contrasted at length. This discussion was placed in the context of the patient's own specific clinical presentation and past medical history. All of their questions have been addressed.    Following the decision to proceed with transcatheter aortic valve replacement, a discussion was held regarding what types of management strategies would be attempted intraoperatively in the event of life-threatening complications, including whether or not the patient would be considered a candidate for the use of cardiopulmonary bypass and/or conversion to open sternotomy for attempted surgical intervention.  I think she would be a candidate for emergent sternotomy to manage any intraoperative complications although she would certainly be at high risk.  The patient is aware of the fact that transient use of cardiopulmonary bypass may be necessary.  The patient  has been advised of a variety of complications that might develop including but not limited to risks of death, stroke, paravalvular leak, aortic dissection or other major vascular complications, aortic annulus rupture, device embolization, cardiac rupture or perforation, mitral regurgitation, acute myocardial infarction, arrhythmia, heart block or bradycardia requiring permanent pacemaker placement, congestive heart failure, respiratory failure, renal failure, pneumonia, infection, other late complications related to structural valve deterioration or migration, or other complications that might ultimately cause a temporary or permanent loss of functional independence or other long term morbidity. The patient provides full informed consent for the procedure as described and all questions were answered.     DETAILS OF THE OPERATIVE PROCEDURE  PREPARATION:    The patient was brought to the operating room on the above mentioned date and appropriate monitoring was established by the anesthesia team. The patient was placed in the supine position on the operating table.  Intravenous antibiotics were administered.  General endotracheal anesthesia was induced uneventfully.    Baseline transesophageal echocardiogram was performed. The patient's chest, abdomen and both groins were prepped and draped in a sterile manner. A time out procedure was performed.   PERIPHERAL ACCESS:    Using the modified Seldinger technique, femoral arterial and venous access was obtained with placement of 6 Fr sheaths on the left side.  A pigtail diagnostic catheter was passed through the left arterial sheath under fluoroscopic guidance into the aortic root.  A temporary transvenous pacemaker catheter was passed through the left femoral venous sheath under fluoroscopic guidance into the right ventricle.  The pacemaker was tested to ensure stable lead placement and pacemaker capture. Aortic root angiography was performed in order to  determine the optimal angiographic angle for valve deployment.  LEFT SUBCLAVIAN ACCESS:   A transverse incision was made below the left clavicle and carried down through the subcutaneous tissue using electrocautery. The pectoralis major muscle was split along its fibers and the pectoralis minor muscle retracted laterally. The left axillary artery was identified and encircled with a vessel loop. The patient was heparinized systemically and ACT verified > 250 seconds.  A double concentric purse string suture of CV-4 gortex was placed in the anterior wall of the artery. The artery was cannulated with a needle and a J- wire advanced into the ascending aorta. An 8 F sheath was inserted over the wire. The aortic valve was crossed with a JR4 catheter and a straight wire. This was exchanged for a pigtail catheter and position was confirmed in the LV apex. Simultaneous LV and Ao pressures were recorded.  The pigtail catheter was exchanged for an Amplatz Extra-stiff wire in the LV apex.  Then a 85F E-sheath was inserted into the axillary artery and the tip advanced into the aortic arch.  BALLOON AORTIC VALVULOPLASTY:   Not performed.  TRANSCATHETER HEART VALVE DEPLOYMENT:   An Edwards Sapien 3 Ultra transcatheter heart valve (size 23 mm) was prepared and crimped per manufacturer's guidelines, and the proper orientation of the valve is confirmed on the [employer] delivery system. The valve was advanced through the introducer sheath using normal technique until in an appropriate position in the ascending aorta beyond the sheath tip. The balloon was then retracted and using the fine-tuning wheel was centered on the valve. The valve was then advanced across the aortic arch using appropriate flexion of the catheter. The valve was carefully positioned across the aortic valve annulus. The Commander catheter was retracted using normal technique. Once final position of the valve  has been confirmed by angiographic  assessment, the valve is deployed while temporarily holding ventilation and during rapid ventricular pacing to maintain systolic blood pressure < 50 mmHg and pulse pressure < 10 mmHg. The balloon inflation is held for >3 seconds after reaching full deployment volume. Once the balloon has fully deflated the balloon is retracted into the ascending aorta and valve function is assessed using echocardiography. There is felt to be trivial no paravalvular leak and no central aortic insufficiency. Post-procedural gradients were acceptable. The patient's hemodynamic recovery following valve deployment is good.  The deployment balloon and guidewire are both removed.     PROCEDURE COMPLETION:   The sheath was removed and subclavian artery closure performed using the 2 purse string sutures.   Protamine was administered once subclavianl arterial repair was complete. The temporary pacemaker, pigtail catheters and femoral sheaths were removed with manual pressure used for hemostasis.  A Mynx femoral closure device was utilized following removal of the diagnostic sheath in the left femoral artery.  The patient tolerated the procedure well and is transported to the cath lab recovery area in stable condition. There were no immediate intraoperative complications. All sponge instrument and needle counts are verified correct at completion of the operation.   No blood products were administered during the operation.  The patient received a total of 80 mL of intravenous contrast during the procedure.   Alleen Borne, MD 11/28/2020

## 2020-11-28 NOTE — Anesthesia Postprocedure Evaluation (Signed)
Anesthesia Post Note  Patient: [name]  Procedure(s) Performed: TRANSCATHETER AORTIC VALVE REPLACEMENT,SUBCLAVIAN (Left) TRANSESOPHAGEAL ECHOCARDIOGRAM (TEE)     Patient location during evaluation: PACU Anesthesia Type: General Level of consciousness: awake and alert Pain management: pain level controlled Vital Signs Assessment: post-procedure vital signs reviewed and stable Respiratory status: spontaneous breathing, nonlabored ventilation, respiratory function stable and patient connected to nasal cannula oxygen Cardiovascular status: blood pressure returned to baseline and stable Postop Assessment: no apparent nausea or vomiting Anesthetic complications: no   No notable events documented.  Last Vitals:  Vitals:   11/28/20 1540 11/28/20 1603  BP: (!) 170/73 (!) 175/83  Pulse: 88 94  Resp: (!) 22 20  Temp:  37.2 C  SpO2: 96% 93%    Last Pain:  Vitals:   11/28/20 1603  TempSrc: Oral  PainSc:                  Kristina Montgomery

## 2020-11-28 NOTE — Interval H&P Note (Signed)
History and Physical Interval Note:  11/28/2020 8:17 AM  Kristina Montgomery  has presented today for surgery, with the diagnosis of Severe Aortic Stenosis.  The various methods of treatment have been discussed with the patient and family. After consideration of risks, benefits and other options for treatment, the patient has consented to  Procedure(s): TRANSCATHETER AORTIC VALVE REPLACEMENT,SUBCLAVIAN (Left) TRANSESOPHAGEAL ECHOCARDIOGRAM (TEE) (N/A) as a surgical intervention.  The patient's history has been reviewed, patient examined, no change in status, stable for surgery.  I have reviewed the patient's chart and labs.  Questions were answered to the patient's satisfaction.     Alleen Borne

## 2020-11-28 NOTE — Anesthesia Procedure Notes (Signed)
Procedure Name: Intubation Date/Time: 11/28/2020 9:50 AM Performed by: Barrington Ellison, CRNA Pre-anesthesia Checklist: Patient identified, Emergency Drugs available, Suction available and Patient being monitored Patient Re-evaluated:Patient Re-evaluated prior to induction Oxygen Delivery Method: Circle System Utilized Preoxygenation: Pre-oxygenation with 100% oxygen Induction Type: IV induction Ventilation: Mask ventilation without difficulty Laryngoscope Size: Mac and 3 Grade View: Grade I Tube type: Oral Tube size: 7.5 mm Number of attempts: 1 Airway Equipment and Method: Stylet and Oral airway Placement Confirmation: ETT inserted through vocal cords under direct vision, positive ETCO2 and breath sounds checked- equal and bilateral Secured at: 21 cm Tube secured with: Tape Dental Injury: Teeth and Oropharynx as per pre-operative assessment

## 2020-11-28 NOTE — Discharge Instructions (Signed)

## 2020-11-28 NOTE — Progress Notes (Signed)
  [facility]   MULTIDISCIPLINARY HEART VALVE TEAM  Patient doing well s/p TAVR. She is hemodynamically stable but hypertensive despite cleviprex drip. Given home Norvasc. Also sinus tach with underlying RBBB. Will cautiously resume home Lopressor 25mg  BID. Give one dose IV hydralazine now to try to wean cleviprex. Groin sites stable. Once normotensive, plan to DC arterial line and transfer to 4E.  Plan for early ambulation after bedrest completed and hopeful discharge over the next 24-48 hours.   PA-C  [facility]  Pager [phone]

## 2020-11-28 NOTE — Progress Notes (Signed)
Mobility Specialist: Progress Note   11/28/20 1722  Mobility  Activity Ambulated in hall  Level of Assistance Modified independent, requires aide device or extra time  Assistive Device Front wheel walker  Distance Ambulated (ft) 310 ft  Mobility Ambulated with assistance in hallway  Mobility Response Tolerated well  Mobility performed by Mobility specialist  $Mobility charge 1 Mobility   Pre-Mobility: 92 HR, 97% SpO2 Post-Mobility: 97 HR, 180/84 BP, 98% SpO2  Pt mod I to sit EOB as well as to stand. Pt to BR and then agreeable to ambulation. Pt had no c/o during ambulation. Pt back to bed after walk with call bell and phone at her side. Family member is present in the room. Minimal bleeding noted at L subclavius site, RN notified.   [facility] Kristina Montgomery Mobility Specialist Mobility Specialist Phone: [phone]

## 2020-11-28 NOTE — Progress Notes (Signed)
Echocardiogram 2D Echocardiogram has been performed.  Warren Lacy Jemarcus Dougal RDCS 11/28/2020, 12:19 PM

## 2020-11-28 NOTE — CV Procedure (Signed)
[facility]  TAVR OPERATIVE NOTE   Date of Procedure:  11/28/2020  Preoperative Diagnosis: Severe Aortic Stenosis   Postoperative Diagnosis: Same   Procedure:   Transcatheter Aortic Valve Replacement - TransSubclavian Artery  Approach  Edwards Sapien 3 THV (size 23 mm, model #[id number] , serial #[id number] )   Co-Surgeons:  [name], MD and Alleen Borne, MD   Anesthesiologist:  Stoltzfus  Echocardiographer:  Croitoru  Pre-operative Echo Findings: Severe aortic stenosis Normal left ventricular systolic function  Post-operative Echo Findings: No  paravalvular leak Normal left ventricular systolic function  BRIEF CLINICAL NOTE AND INDICATIONS FOR SURGERY  65 yo female with history of asthma/COPD, current everyday smoker, diabetes mellitus type 2, hyperlipidemia, HTN, anxiety/depression, fibromyalgia, recent CVA, mild CAD and severe aortic stenosis. She is an active smoker, smoking 1 ppd for 45 years. She has COPD. She has diabetes and is on oral therapy. She has had progressive fatigue, dyspnea, dizziness and chest pressure. She was seen by Dr. Jacques Navy 05/15/20 for the first time. Echo 05/03/20 with LVEF=60-65%, mild LVH. There was severe aortic stenosis. Mean gradient 40 mmHg, peak gradient 64.3 mmHg, AVA 0.98 cm2, dimensionless index 0.31, SVI 44. Dr. Jacques Navy felt that her aortic stenosis as severe. At her first visit with me in May 2022, she described progressive dyspnea, chest pressure, fatigue and dizziness. No near syncope or syncope. She came in for her cardiac cath on 06/01/20 and was found to have altered mental status upon arrival to the hospital. She was found to have an acute stroke. (Cortical/subcortical infarct within the left parietal lobe measuring 3.0 x 2.6 x 3.3 cm. Diffusion-weighted hyperintensity is present along the periphery of the infarct. However, there is predominantly corresponding intermediate signal on the ADC map. Additionally, there  is developing encephalomalacia at this site and this infarct is likely subacute. Petechial hemorrhage is also present at this site. No significant mass effect. There are two acute/early subacute infarcts within the posterior left frontoparietal subcortical white matter). Carotid dopplers with mild nonobstructive disease bilaterally. She was discharged on ASA and Plavix but she did not start Plavix as advised and reported missing doses of ASA. She came in for follow up in our cardiology office to see Bettina Gavia, PA-C. She was having chest pain in the office and was admitted. Cardiac cath 06/14/20 with mild non-obstructive CAD. Mean gradient 27.7 mmHg by cath.     She continues to have dyspnea on exertion, fatigue and some dizziness  During the course of the patient's preoperative work up they have been evaluated comprehensively by a multidisciplinary team of specialists coordinated through the Multidisciplinary Heart Valve Clinic in the [facility].  They have been demonstrated to suffer from symptomatic severe aortic stenosis as noted above. The patient has been counseled extensively as to the relative risks and benefits of all options for the treatment of severe aortic stenosis including long term medical therapy, conventional surgery for aortic valve replacement, and transcatheter aortic valve replacement.  The patient has been independently evaluated by Dr. Laneta Simmers with CT surgery and they are felt to be at high risk for conventional surgical aortic valve replacement. The surgeon indicated the patient would be a poor candidate for conventional surgery. Based upon review of all of the patient's preoperative diagnostic tests they are felt to be candidate for transcatheter aortic valve replacement using the transfemoral approach as an alternative to high risk conventional surgery.    Following the decision to proceed with transcatheter  aortic valve replacement, a discussion has been  held regarding what types of management strategies would be attempted intraoperatively in the event of life-threatening complications, including whether or not the patient would be considered a candidate for the use of cardiopulmonary bypass and/or conversion to open sternotomy for attempted surgical intervention.  The patient has been advised of a variety of complications that might develop peculiar to this approach including but not limited to risks of death, stroke, paravalvular leak, aortic dissection or other major vascular complications, aortic annulus rupture, device embolization, cardiac rupture or perforation, acute myocardial infarction, arrhythmia, heart block or bradycardia requiring permanent pacemaker placement, congestive heart failure, respiratory failure, renal failure, pneumonia, infection, other late complications related to structural valve deterioration or migration, or other complications that might ultimately cause a temporary or permanent loss of functional independence or other long term morbidity.  The patient provides full informed consent for the procedure as described and all questions were answered preoperatively.    DETAILS OF THE OPERATIVE PROCEDURE  PREPARATION:   The patient is brought to the operating room on the above mentioned date and central monitoring was established by the anesthesia team including placement of a radial arterial line. The patient is placed in the supine position on the operating table.  Intravenous antibiotics are administered. General anesthesia is used.   Baseline transesophageal echocardiogram was performed. The patient's chest, abdomen, both groins, and both lower extremities are prepared and draped in a sterile manner. A time out procedure is performed.   PERIPHERAL ACCESS:   Using the modified Seldinger technique, femoral arterial and venous access were obtained with placement of a 6 Fr sheath in the artery and a 7 Fr sheath in the vein on  the left side using u/s guidance.  A pigtail diagnostic catheter was passed through the femoral arterial sheath under fluoroscopic guidance into the aortic root.  A temporary transvenous pacemaker catheter was passed through the femoral venous sheath under fluoroscopic guidance into the right ventricle.  The pacemaker was tested to ensure stable lead placement and pacemaker capture. Aortic root angiography was performed in order to determine the optimal angiographic angle for valve deployment.  TRANS-SUBCLAVIAN Artery Access:  Please see Dr. Sharee Pimple operative note for details of the cutdown to the left subclavian artery.   The patient was heparinized systemically and ACT verified > 250 seconds.  A needle was used to access the artery. A J wire was advanced into the ascending aorta. A 6 French sheath was placed over the wire. An AL-1 catheter was used to cross the aortic valve with a straight wire. The AL-1 was advanced into the LV and a long J wire was then used to exchange this catheter for a pigtail catheter. An Amplatz Extra stiff wire was then placed into the pigtail catheter and position confirmed in the LV apex. A 14 Fr E-sheath was introduced into the left subclavian artery.    TRANSCATHETER HEART VALVE DEPLOYMENT:  An Edwards Sapien 3 THV (size 23 mm) was prepared and crimped per manufacturer's guidelines, and the proper orientation of the valve is confirmed on the [employer] delivery system. The valve was advanced through the introducer sheath using normal technique until in an appropriate position in the ascending aorta. The balloon was then retracted and using the fine-tuning wheel was centered on the valve. The valve was then advanced across the aortic valve annulus. The Commander catheter was retracted using normal technique. Once final position of the valve has been confirmed by angiographic  assessment, the valve is deployed while temporarily holding ventilation and during rapid  ventricular pacing to maintain systolic blood pressure < 50 mmHg and pulse pressure < 10 mmHg. The balloon inflation is held for >3 seconds after reaching full deployment volume. Once the balloon has fully deflated the balloon is retracted into the ascending aorta and valve function is assessed using TEE. There is felt to be no paravalvular leak and no central aortic insufficiency.  The patient's hemodynamic recovery following valve deployment is good.  The deployment balloon and guidewire are both removed. Echo demostrated acceptable post-procedural gradients, stable mitral valve function, and no AI.   PROCEDURE COMPLETION:  The sheath was then removed. Please see Dr. Sharee Pimple note with details of closure. Protamine was administered once femoral arterial repair was complete. The temporary pacemaker, pigtail catheters and femoral sheaths were removed with a Mynx closure device placed in the artery and manual pressure used for venous hemostasis.    The patient tolerated the procedure well and is transported to the surgical intensive care in stable condition. There were no immediate intraoperative complications. All sponge instrument and needle counts are verified correct at completion of the operation.   No blood products were administered during the operation.  The patient received a total of 80 mL of intravenous contrast during the procedure.  [name] MD 11/28/2020 12:04 PM

## 2020-11-28 NOTE — Transfer of Care (Signed)
Immediate Anesthesia Transfer of Care Note  Patient: Kristina Montgomery  Procedure(s) Performed: TRANSCATHETER AORTIC VALVE REPLACEMENT,SUBCLAVIAN (Left) TRANSESOPHAGEAL ECHOCARDIOGRAM (TEE)  Patient Location: Cath Lab  Anesthesia Type:General  Level of Consciousness: drowsy and patient cooperative  Airway & Oxygen Therapy: Patient Spontanous Breathing  Post-op Assessment: Report given to RN and Patient moving all extremities X 4  Post vital signs: Reviewed and stable  Last Vitals:  Vitals Value Taken Time  BP 170/64 11/28/20 1224  Temp    Pulse 107 11/28/20 1228  Resp 18 11/28/20 1228  SpO2 97 % 11/28/20 1228  Vitals shown include unvalidated device data.  Last Pain:  Vitals:   11/28/20 0805  TempSrc:   PainSc: 7       Patients Stated Pain Goal: 2 (11/28/20 0805)  Complications: No notable events documented.

## 2020-11-28 NOTE — Anesthesia Procedure Notes (Signed)
Arterial Line Insertion Start/End11/08/2020 9:30 AM, 11/28/2020 9:33 AM Performed by: Atilano Median, DO, anesthesiologist  Patient location: Pre-op. Preanesthetic checklist: patient identified, IV checked, site marked, risks and benefits discussed, surgical consent, monitors and equipment checked, pre-op evaluation, timeout performed and anesthesia consent Lidocaine 1% used for infiltration Right, brachial was placed Catheter size: 20 G Hand hygiene performed , maximum sterile barriers used  and Seldinger technique used  Attempts: 1 Procedure performed using ultrasound guided technique. Ultrasound Notes:anatomy identified, needle tip was noted to be adjacent to the nerve/plexus identified and no ultrasound evidence of intravascular and/or intraneural injection Following insertion, dressing applied and Biopatch. Post procedure assessment: normal and unchanged  Post procedure complications: second provider assisted. Patient tolerated the procedure well with no immediate complications. Additional procedure comments: Initial attempts radially with and without [place] unsuccessful, single attempt brachial under US guidance. Marland Kitchen

## 2020-11-29 ENCOUNTER — Inpatient Hospital Stay (HOSPITAL_COMMUNITY)
Admission: RE | Admit: 2020-11-29 | Discharge: 2020-11-29 | Disposition: A | Discharge status: Home / Self Care | Payer: Medicare Other | Source: Home / Self Care | Attending: Physician Assistant | Admitting: Physician Assistant

## 2020-11-29 ENCOUNTER — Inpatient Hospital Stay (HOSPITAL_COMMUNITY): Payer: Medicare Other

## 2020-11-29 DIAGNOSIS — Z952 Presence of prosthetic heart valve: Secondary | ICD-10-CM | POA: Diagnosis not present

## 2020-11-29 DIAGNOSIS — Z954 Presence of other heart-valve replacement: Secondary | ICD-10-CM

## 2020-11-29 DIAGNOSIS — Z8673 Personal history of transient ischemic attack (TIA), and cerebral infarction without residual deficits: Secondary | ICD-10-CM

## 2020-11-29 DIAGNOSIS — I350 Nonrheumatic aortic (valve) stenosis: Secondary | ICD-10-CM

## 2020-11-29 DIAGNOSIS — I4510 Unspecified right bundle-branch block: Secondary | ICD-10-CM

## 2020-11-29 LAB — CBC
HCT: 44.3 % (ref 36.0–46.0)
Hemoglobin: 14.7 g/dL (ref 12.0–15.0)
MCH: 29.4 pg (ref 26.0–34.0)
MCHC: 33.2 g/dL (ref 30.0–36.0)
MCV: 88.6 fL (ref 80.0–100.0)
Platelets: 244 10*3/uL (ref 150–400)
RBC: 5 MIL/uL (ref 3.87–5.11)
RDW: 14.1 % (ref 11.5–15.5)
WBC: 16.8 10*3/uL — ABNORMAL HIGH (ref 4.0–10.5)
nRBC: 0 % (ref 0.0–0.2)

## 2020-11-29 LAB — ECHOCARDIOGRAM COMPLETE
AR max vel: 1.24 cm2
AV Area VTI: 1.19 cm2
AV Area mean vel: 1.25 cm2
AV Mean grad: 10 mmHg
AV Peak grad: 18.7 mmHg
Ao pk vel: 2.16 m/s
Area-P 1/2: 2.62 cm2
Calc EF: 53.4 %
Height: 65 in
S' Lateral: 3.8 cm
Single Plane A2C EF: 53.3 %
Single Plane A4C EF: 55.2 %
Weight: 2675.2 oz

## 2020-11-29 LAB — BASIC METABOLIC PANEL
Anion gap: 11 (ref 5–15)
BUN: 13 mg/dL (ref 8–23)
CO2: 24 mmol/L (ref 22–32)
Calcium: 8.4 mg/dL — ABNORMAL LOW (ref 8.9–10.3)
Chloride: 95 mmol/L — ABNORMAL LOW (ref 98–111)
Creatinine, Ser: 0.72 mg/dL (ref 0.44–1.00)
GFR, Estimated: >60 mL/min (ref >60)
Glucose, Bld: 257 mg/dL — ABNORMAL HIGH (ref 70–99)
Potassium: 3.4 mmol/L — ABNORMAL LOW (ref 3.5–5.1)
Sodium: 130 mmol/L — ABNORMAL LOW (ref 135–145)

## 2020-11-29 LAB — GLUCOSE, CAPILLARY
Glucose-Capillary: 340 mg/dL — ABNORMAL HIGH (ref 70–99)
Glucose-Capillary: 149 mg/dL — ABNORMAL HIGH (ref 70–99)

## 2020-11-29 LAB — MAGNESIUM: Magnesium: 1.7 mg/dL (ref 1.7–2.4)

## 2020-11-29 MED ORDER — METOPROLOL TARTRATE 50 MG PO TABS: 50.0000 mg | ORAL_TABLET | Freq: Two times a day (BID) | ORAL | 6 refills | Status: DC

## 2020-11-29 NOTE — Discharge Summary (Signed)
[facility]   MULTIDISCIPLINARY HEART VALVE TEAM  Discharge Summary    Patient ID: Kristina Montgomery MRN: [id number]; DOB: 1955/10/12  Admit date: 11/28/2020 Discharge date: 11/29/2020  Primary Care Provider: Galvin Proffer, MD  Primary Cardiologist: Parke Poisson, MD / Dr. Clifton James & Dr. Laneta Simmers (TAVR)  Discharge Diagnoses    Principal Problem:   S/P TAVR (transcatheter aortic valve replacement) Active Problems:   History of CVA (cerebrovascular accident)   Type 2 diabetes mellitus with hyperlipidemia (HCC)   Tobacco abuse   Severe aortic stenosis   Atherosclerotic heart disease of native coronary artery without angina pectoris   Bipolar affective disorder, currently depressed, moderate (HCC)   Chronic kidney disease, stage 3 unspecified (HCC)   Chronic obstructive pulmonary disease (HCC)   Chronic pain syndrome   Essential hypertension   Mixed hyperlipidemia   Obstructive sleep apnea syndrome   Allergies Allergies  Allergen Reactions   Codeine Itching    Facial itching    Diagnostic Studies/Procedures    TAVR OPERATIVE NOTE     Date of Procedure:                11/28/2020   Preoperative Diagnosis:      Severe Aortic Stenosis    Postoperative Diagnosis:    Same    Procedure:        Transcatheter Aortic Valve Replacement - Left subclavian artery approach             Edwards Sapien 3 Ultra RSL THV (size 23 mm, model # 9755RSL, serial # [id number])              Co-Surgeons:                        Alleen Borne, MD and [name], MD     Anesthesiologist:                  Lavenia Atlas, DO   Echocardiographer:              Ruby Cola, MD   Pre-operative Echo Findings: Severe aortic stenosis Normal left ventricular systolic function   Post-operative Echo Findings: No paravalvular leak Normal left ventricular systolic function     _____________    Echo 11/29/20: completed but pending formal read at the time of discharge    History of Present Illness     Kristina Montgomery is a 65 y.o. female with a history of asthma/COPD, current everyday smoker, diabetes mellitus type 2, HLD, HTN, anxiety/depression, fibromyalgia, recent CVA (06/01/20), mild CAD and severe aortic stenosis who presented to [facility] on 11/28/20 for planned TAVR.   She presented with progressive exertional fatigue and shortness of breath as well as chest pressure and dizziness.  She was seen by Dr. Jacques Navy in April 2022 and echocardiogram at that time showed severe aortic stenosis with a mean gradient of 40 mmHg and a peak gradient of 64 mmHg.  Aortic valve area was 0.9 cm with a dimensionless index of 0.31.  Left ventricular ejection fraction was 60 to 65%.  She was referred to Dr. Clifton James and was set up for cardiac catheterization but when she came in for cath she was found to have altered mental status and was diagnosed with an acute stroke.  This was a cortical/subcortical infarct involving the left parietal lobe measuring 3 x 3 cm.  There was some developing encephalomalacia at the site and this infarct was felt to  be subacute.  There were 2 acute/early subacute infarcts within the posterior left frontoparietal subcortical white matter.  Carotid Dopplers showed mild nonobstructive disease bilaterally.  She was discharged on aspirin and Plavix but did not start taking the Plavix and reported missing doses of her aspirin.  She was seen back in the cardiology office having chest discomfort and was admitted.  She underwent cardiac catheterization on 06/14/2020 showing mild nonobstructive coronary disease.  The mean gradient across the aortic valve was 28 mmHg.  There was some difficulty contacting her to complete her TAVR work-up but this was eventually completed in September.  The patient has been evaluated by the multidisciplinary valve team and felt to have severe, symptomatic aortic stenosis and to be a suitable candidate for TAVR, which was set up for 11/28/20.    Hospital Course     Consultants: none   Severe AS: s/p successful TAVR with a 23 mm Edwards Sapien 3 Ultra R THV via the left subclavian approach on 11/28/20. Post operative echo completed but pending formal read. Groin /subclavian sites are stable. ECG with sinus with underlying RBBB but no high grade heart block. Continue Asprin alone. Plan for discharge home today with close follow up in the office next week.  Underlying RBBB: given known conduction disease, sinus tachy requiring increase in BB and recent CVA, will plan to discharge with a Zio AT to rule out HAVB or afib.    HTN: BP was very elevated post operatively requiring a cleviprex gtt and IV hydralazine despite resuming home Norvasc 10mg  daily and Lopressor 25mg  BID. She also was noted to have sinus tachy. Will plan to increase Lopressor to 50mg  BID and monitor in the outpatient setting.   Hx of CVA: continue aspirin and statin.  _____________  Discharge Vitals Blood pressure (!) 130/57, pulse 100, temperature 98.3 F (36.8 C), temperature source Oral, resp. rate 19, height 5\' 5"  (1.651 m), weight 75.8 kg, SpO2 98 %.  Filed Weights   11/28/20 0722 11/29/20 0326  Weight: 72.6 kg 75.8 kg    GEN: Well nourished, well developed, in no acute distress HEENT: normal Neck: no JVD or masses Cardiac: RRR; soft flow murmurs. No rubs, or gallops,no edema  Respiratory:  clear to auscultation bilaterally, normal work of breathing GI: soft, nontender, nondistended, + BS MS: no deformity or atrophy Skin: warm and dry, no rash. Groin/subclavian sites clear without hematoma or ecchymosis . Chest dressed.  Neuro:  Alert and Oriented x 3, Strength and sensation are intact Psych: euthymic mood, full affect   Labs & Radiologic Studies    CBC Recent Labs    11/28/20 1244 11/29/20 0225  WBC  --  16.8*  HGB 15.6* 14.7  HCT 46.0 44.3  MCV  --  88.6  PLT  --  244   Basic Metabolic Panel Recent Labs    13/08/22 1244 11/29/20 0225   NA 137 130*  K 3.8 3.4*  CL 101 95*  CO2  --  24  GLUCOSE 161* 257*  BUN 21 13  CREATININE 0.60 0.72  CALCIUM  --  8.4*  MG  --  1.7   Liver Function Tests No results for input(s): AST, ALT, ALKPHOS, BILITOT, PROT, ALBUMIN in the last 72 hours. No results for input(s): LIPASE, AMYLASE in the last 72 hours. Cardiac Enzymes No results for input(s): CKTOTAL, CKMB, CKMBINDEX, TROPONINI in the last 72 hours. BNP Invalid input(s): POCBNP D-Dimer No results for input(s): DDIMER in the last 72 hours. Hemoglobin A1C  No results for input(s): HGBA1C in the last 72 hours. Fasting Lipid Panel No results for input(s): CHOL, HDL, LDLCALC, TRIG, CHOLHDL, LDLDIRECT in the last 72 hours. Thyroid Function Tests No results for input(s): TSH, T4TOTAL, T3FREE, THYROIDAB in the last 72 hours.  Invalid input(s): FREET3 _____________  DG Chest 2 View  Result Date: 11/27/2020 CLINICAL DATA:  History of aortic stenosis, preoperative evaluation in a 65 year old. EXAM: CHEST - 2 VIEW COMPARISON:  Jun 13, 2020. FINDINGS: The heart size and mediastinal contours are within normal limits. Both lungs are clear. The visualized skeletal structures are unremarkable. IMPRESSION: No active cardiopulmonary disease. Electronically Signed   By: Donzetta Kohut M.D.   On: 11/27/2020 08:06   ECHO TEE  Result Date: 11/28/2020    TRANSESOPHOGEAL ECHO REPORT   Patient Name:   Kristina Montgomery Date of Exam: 11/28/2020 Medical Rec #:  [id number]       Height:       65.0 in Accession #:    [id number]      Weight:       160.0 lb Date of Birth:  09-Feb-1955       BSA:          1.799 m Patient Age:    65 years        BP:           173/61 mmHg Patient Gender: F               HR:           107 bpm. Exam Location:  Inpatient Procedure: Transesophageal Echo, Color Doppler and Cardiac Doppler Indications:     Aortic Stenosis i35.0  History:         Patient has prior history of Echocardiogram examinations, most                  recent  06/01/2020. COPD; Risk Factors:Hypertension, Diabetes,                  Dyslipidemia and Sleep Apnea.  Sonographer:     Irving Burton Senior RDCS Referring Phys:  [id number] Kathleene Hazel Diagnosing Phys: Thurmon Fair MD  Sonographer Comments: 23mm Edwards S3U TAVR Implanted PROCEDURE: After discussion of the risks and benefits of a TEE, an informed consent was obtained from the patient. The transesophogeal probe was passed without difficulty through the esophogus of the patient. Sedation performed by different physician. The patient developed no complications during the procedure. PREOPERATIVE STUDY  Normal left ventricular regional wall motion and overall systolic function. EF 65%. Trileaflet aortic valve with severe calcific changes. There is severe aortic stenosis. Peak gradient 74 mm Hg, mean gradient 47 mm Hg, dimensionless index 0.24, calculated aortic valve area 1.0 cm sq. There is moderate aortic insufficiency. There is no mitral insufficiency. There is no pericardial effusion. There is severe protruding plaque in the distal descending thoracic aorta. No mobile components are seen. POSTOPERATIVE STUDY Hyperdynamic left ventricular regional wall motion and overall systolic function. EF 75%. Well seated TAVR stent valve (23 mm [employer]). Peak gradient 20 mm Hg, mean gradient 10 mm Hg, dimensionless index 0.5, calculated aortic valve area 2.6 cm sq, acceleration time 53 ms. There is trivial perivalvular aortic insufficiency. There is mild mitral insufficiency. There is no pericardial effusion. There is unchanged protruding plaque in the distal descending thoracic aorta.  IMPRESSIONS  1. Left ventricular ejection fraction, by estimation, is 60 to 65%. The left ventricle has normal function. There is moderate concentric  left ventricular hypertrophy.  2. Right ventricular systolic function is normal. The right ventricular size is normal.  3. Left atrial size was moderately dilated. No left atrial/left atrial  appendage thrombus was detected.  4. The mitral valve is normal in structure. Mild to moderate mitral valve regurgitation.  5. The aortic valve is tricuspid. There is severe calcifcation of the aortic valve. There is severe thickening of the aortic valve. Aortic valve regurgitation is moderate to severe. Severe aortic valve stenosis.  6. There is Severe (Grade IV) protruding plaque involving the descending aorta. FINDINGS  Left Ventricle: Left ventricular ejection fraction, by estimation, is 60 to 65%. The left ventricle has normal function. The left ventricular internal cavity size was normal in size. There is moderate concentric left ventricular hypertrophy. Right Ventricle: The right ventricular size is normal. No increase in right ventricular wall thickness. Right ventricular systolic function is normal. Left Atrium: Left atrial size was moderately dilated. No left atrial/left atrial appendage thrombus was detected. Right Atrium: Right atrial size was normal in size. Pericardium: There is no evidence of pericardial effusion. Mitral Valve: The mitral valve is normal in structure. Mild to moderate mitral valve regurgitation, with eccentric posteriorly directed jet. Tricuspid Valve: The tricuspid valve is normal in structure. Tricuspid valve regurgitation is not demonstrated. Aortic Valve: The aortic valve is tricuspid. There is severe calcifcation of the aortic valve. There is severe thickening of the aortic valve. Aortic valve regurgitation is moderate to severe. Aortic regurgitation PHT measures 334 msec. Severe aortic stenosis is present. Aortic valve mean gradient measures 10.0 mmHg. Aortic valve peak gradient measures 20.2 mmHg. Aortic valve area, by VTI measures 2.59 cm. Pulmonic Valve: The pulmonic valve was normal in structure. Pulmonic valve regurgitation is not visualized. Aorta: The aortic root, ascending aorta and aortic arch are all structurally normal, with no evidence of dilitation or obstruction.  There is severe (Grade IV) protruding plaque involving the descending aorta. IAS/Shunts: The interatrial septum appears to be lipomatous. No atrial level shunt detected by color flow Doppler.  LEFT VENTRICLE PLAX 2D LVOT diam:     2.30 cm LV SV:         99 LV SV Index:   55 LVOT Area:     4.15 cm  AORTIC VALVE AV Area (Vmax):    2.09 cm AV Area (Vmean):   2.30 cm AV Area (VTI):     2.59 cm AV Vmax:           225.00 cm/s AV Vmean:          144.000 cm/s AV VTI:            0.382 m AV Peak Grad:      20.2 mmHg AV Mean Grad:      10.0 mmHg LVOT Vmax:         113.00 cm/s LVOT Vmean:        79.600 cm/s LVOT VTI:          0.238 m LVOT/AV VTI ratio: 0.62 AI PHT:            334 msec  SHUNTS Systemic VTI:  0.24 m Systemic Diam: 2.30 cm Rachelle Hora Croitoru MD Electronically signed by Thurmon Fair MD Signature Date/Time: 11/28/2020/6:30:58 PM    Final    Structural Heart Procedure  Result Date: 11/28/2020 See full operative note in the progress notes section. 23 mm Sapien 3 Ultra valve implanted from left subclavian artery approach.   Disposition   Pt is being discharged home  today in good condition.  Follow-up Plans & Appointments     Follow-up Information     Janetta Hora, PA-C. Go on 12/06/2020.   Specialties: Cardiology, Radiology Why: at 2:30pm. Please arrive at 2:15pm to your appointment Contact information: [address] [place] [address] [phone]                Discharge Instructions     Amb Referral to Cardiac Rehabilitation   Complete by: As directed    To [facility]   Diagnosis: Valve Replacement   Valve: Aortic Comment - TAVR   After initial evaluation and assessments completed: Virtual Based Care may be provided alone or in conjunction with Phase 2 Cardiac Rehab based on patient barriers.: Yes       Discharge Medications   Allergies as of 11/29/2020       Reactions   Codeine Itching   Facial itching        Medication List     TAKE  these medications    albuterol 108 (90 Base) MCG/ACT inhaler Commonly known as: VENTOLIN HFA Inhale 1-2 puffs into the lungs every 6 (six) hours as needed for wheezing or shortness of breath.   amitriptyline 50 MG tablet Commonly known as: ELAVIL Take 50 mg by mouth at bedtime.   amLODipine 10 MG tablet Commonly known as: NORVASC TAKE 1 TABLET BY MOUTH ONCE DAILY   ARIPiprazole 10 MG tablet Commonly known as: ABILIFY Take 10 mg by mouth daily.   aspirin EC 81 MG tablet Take 81 mg by mouth daily. Swallow whole.   baclofen 20 MG tablet Commonly known as: LIORESAL Take 20 mg by mouth 3 (three) times daily as needed for muscle spasms.   Basaglar KwikPen 100 UNIT/ML Inject 10 Units into the skin daily.   Breztri Aerosphere 160-9-4.8 MCG/ACT Aero Generic drug: Budeson-Glycopyrrol-Formoterol Inhale 1 puff into the lungs in the morning and at bedtime.   desvenlafaxine 50 MG 24 hr tablet Commonly known as: PRISTIQ Take 50 mg by mouth daily.   doxepin 75 MG capsule Commonly known as: SINEQUAN Take 300 mg by mouth daily.   glimepiride 4 MG tablet Commonly known as: AMARYL Take 4 mg by mouth daily with breakfast.   levothyroxine 100 MCG tablet Commonly known as: SYNTHROID Take 100 mcg by mouth daily before breakfast.   Synthroid 25 MCG tablet Generic drug: levothyroxine Take 25 mcg by mouth daily before breakfast.   meclizine 25 MG tablet Commonly known as: ANTIVERT Take 25 mg by mouth every 12 (twelve) hours.   metoprolol tartrate 50 MG tablet Commonly known as: LOPRESSOR Take 1 tablet (50 mg total) by mouth 2 (two) times daily. What changed:  medication strength how much to take   nitroGLYCERIN 0.4 MG SL tablet Commonly known as: NITROSTAT Place 0.4 mg under the tongue every 5 (five) minutes as needed for chest pain.   ondansetron 8 MG disintegrating tablet Commonly known as: ZOFRAN-ODT Take 8 mg by mouth every 8 (eight) hours as needed for nausea or  vomiting.   oxyCODONE 30 MG 12 hr tablet Take 1 tablet by mouth every 12 (twelve) hours.   Ozempic (0.25 or 0.5 MG/DOSE) 2 MG/1.5ML Sopn Generic drug: Semaglutide(0.25 or 0.5MG /DOS) Inject 0.5 mg into the skin every Monday.   pioglitazone 45 MG tablet Commonly known as: ACTOS Take 45 mg by mouth daily.   pramipexole 0.25 MG tablet Commonly known as: MIRAPEX Take 0.5 mg by mouth at bedtime.   rosuvastatin 40  MG tablet Commonly known as: CRESTOR Take 40 mg by mouth daily.         Outstanding Labs/Studies   none  Duration of Discharge Encounter   Greater than 30 minutes including physician time.  SignedCline Crock, PA-C 11/29/2020, 11:56 AM [phone]

## 2020-11-29 NOTE — Progress Notes (Signed)
Inpatient Diabetes Program Recommendations  AACE/ADA: New Consensus Statement on Inpatient Glycemic Control (2015)  Target Ranges:  Prepandial:   less than 140 mg/dL      Peak postprandial:   less than 180 mg/dL (1-2 hours)      Critically ill patients:  140 - 180 mg/dL   Lab Results  Component Value Date   GLUCAP 149 (H) 11/29/2020   HGBA1C 12.5 (H) 11/24/2020    Review of Glycemic Control Results for ROGENE, METH (MRN [id number]) as of 11/29/2020 15:15  Ref. Range 11/29/2020 07:18 11/29/2020 11:14  Glucose-Capillary Latest Ref Range: 70 - 99 mg/dL 353 (H) 614 (H)   Diabetes history: DM 2 Outpatient Diabetes medications: Basaglar 10 units daily, Amaryl 4 mg daily, Ozempic 0.25 mg weekly, Actos 45 mg daily Current orders for Inpatient glycemic control:  TCTS 0-24 units tid with meals and HS  Inpatient Diabetes Program Recommendations:    Note patient is discharging home today.  Briefly spoke with patient and her son at bedside to remind her of the importance of glycemic control post-surgery and to prevent complications of DM.  Patient answered questions but did not seem engaged.  Explained to son importance of monitoring and f/u with PCP.  Also encouraged patient to check her blood sugars regularly. Patient anxious to go home.    Thanks,  Beryl Meager, RN, BC-ADM Inpatient Diabetes Coordinator Pager [phone]  (8a-5p)

## 2020-11-29 NOTE — Progress Notes (Signed)
CARDIAC REHAB PHASE I   PRE:  Rate/Rhythm: 85 SR    BP: sitting 107/52    SaO2: 95 RA  MODE:  Ambulation: 270 ft   POST:  Rate/Rhythm: 97 SR    BP: sitting 99/71     SaO2: 96 RA   Pt ambulated with RW, noted right side weaker. Tolerated well, no LOB. To BR. No c/o.  Discussed restrictions, walking, smoking cessation, DM control, and CRPII. Sts she wants to quit smoking. Will refer to [facility] CRPII. Son present and sts someone will be with her tonight. Some memory trouble. [phone]  Harriet Masson CES, ACSM 11/29/2020 9:39 AM

## 2020-11-29 NOTE — Progress Notes (Signed)
1 Day Post-Op Procedure(s) (LRB): TRANSCATHETER AORTIC VALVE REPLACEMENT,SUBCLAVIAN (Left) TRANSESOPHAGEAL ECHOCARDIOGRAM (TEE) Subjective: No complaints. Ambulating without difficulty.   Objective: Vital signs in last 24 hours: Temp:  [98 F (36.7 C)-99 F (37.2 C)] 98.3 F (36.8 C) (11/09 0805) Pulse Rate:  [66-117] 100 (11/09 0805) Cardiac Rhythm: Normal sinus rhythm;Bundle branch block (11/09 0703) Resp:  [12-23] 19 (11/09 0805) BP: (103-181)/(53-83) 130/57 (11/09 0805) SpO2:  [93 %-100 %] 98 % (11/09 0805) Arterial Line BP: (162-185)/(59-70) 185/62 (11/08 1430) Weight:  [75.8 kg] 75.8 kg (11/09 0326)  Hemodynamic parameters for last 24 hours:    Intake/Output from previous day: 11/08 0701 - 11/09 0700 In: 1790 [P.O.:490; I.V.:1100; IV Piggyback:200] Out: 950 [Urine:850; Blood:100] Intake/Output this shift: No intake/output data recorded.  General appearance: alert and cooperative Neurologic: intact Heart: regular rate and rhythm, S1, S2 normal, no murmur Lungs: clear to auscultation bilaterally Extremities: no edema Wound: left chest incision looks good. Dressing changed. Left groin site ok  Lab Results: Recent Labs    11/28/20 1244 11/29/20 0225  WBC  --  16.8*  HGB 15.6* 14.7  HCT 46.0 44.3  PLT  --  244   BMET:  Recent Labs    11/28/20 1244 11/29/20 0225  NA 137 130*  K 3.8 3.4*  CL 101 95*  CO2  --  24  GLUCOSE 161* 257*  BUN 21 13  CREATININE 0.60 0.72  CALCIUM  --  8.4*    PT/INR: No results for input(s): LABPROT, INR in the last 72 hours. ABG    Component Value Date/Time   PHART 7.378 11/28/2020 1100   HCO3 30.3 (H) 11/28/2020 1100   TCO2 26 11/28/2020 1244   ACIDBASEDEF 0.7 11/24/2020 1433   O2SAT 100.0 11/28/2020 1100   CBG (last 3)  Recent Labs    11/28/20 1617 11/28/20 2059 11/29/20 0718  GLUCAP 208* 191* 340*    Assessment/Plan: S/P Procedure(s) (LRB): TRANSCATHETER AORTIC VALVE REPLACEMENT,SUBCLAVIAN  (Left) TRANSESOPHAGEAL ECHOCARDIOGRAM (TEE)  POD 1 Hemodynamically stable in sinus rhythm. Plan 2D echo this am and then home.   LOS: 1 day    Alleen Borne 11/29/2020

## 2020-11-29 NOTE — Plan of Care (Signed)
  Problem: Health Behavior/Discharge Planning: Goal: Ability to manage health-related needs will improve Outcome: Progressing   Problem: Activity: Goal: Risk for activity intolerance will decrease Outcome: Progressing   Problem: Elimination: Goal: Will not experience complications related to bowel motility Outcome: Progressing   Problem: Pain Managment: Goal: General experience of comfort will improve Outcome: Progressing   Problem: Safety: Goal: Ability to remain free from injury will improve Outcome: Progressing

## 2020-11-29 NOTE — Progress Notes (Signed)
Plan of care reviewed. Pt is progressing. She is hemodynamically stable, afebrile, sinus rhythm with RBBB on monitor. HR 90s. BP 103/65- 148/67 mmHg. SPO2 96% on room air.  Her left subclavian and left groin are negative for hematoma. Pain is tolerated well. No acute distress noted. We will monitor.   Filiberto Pinks, RN

## 2020-11-30 ENCOUNTER — Telehealth: Payer: Self-pay | Admitting: Physician Assistant

## 2020-11-30 ENCOUNTER — Telehealth (HOSPITAL_COMMUNITY): Payer: Self-pay

## 2020-11-30 NOTE — Telephone Encounter (Signed)
Per phase I cardiac rehab, fax cardiac rehab referral to [facility] cardiac [facility]. °

## 2020-11-30 NOTE — Telephone Encounter (Signed)
  [facility]   MULTIDISCIPLINARY HEART VALVE TEAM   Patient contacted regarding discharge from [facility] on 11/10  Patient understands to follow up with provider Carlean Jews on 11/16 at [facility].  Patient understands discharge instructions? yes Patient understands medications and regimen? yes Patient understands to bring all medications to this visit? yes  Cline Crock PA-C  [facility]

## 2020-12-05 NOTE — Progress Notes (Signed)
[facility]ART AND VASCULAR CENTER   MULTIDISCIPLINARY HEART VALVE CLINIC                                     Cardiology Office Note:    Date:  12/06/2020   ID:  Kristina Montgomery, DOB [birth year], MRN [id number]  PCP:  Galvin Proffer, MD  [facility] Cardiologist:  Parke Poisson, MD / Dr. Clifton James & Dr. Laneta Simmers (TAVR) [facility] Electrophysiologist:  None   Referring MD: Galvin Proffer, MD   [facility] s/p TAVR  History of Present Illness:    Kristina Montgomery is a 65 y.o. female with a hx of asthma/COPD, current everyday smoker, diabetes mellitus type 2, HLD, HTN, anxiety/depression, fibromyalgia, recent CVA (06/01/20), mild CAD and severe aortic stenosis s/p TAVR (11/28/20) who presents to clinic for follow up.  She presented with progressive exertional fatigue and shortness of breath as well as chest pressure and dizziness.  She was seen by Dr. Jacques Navy in April 2022 and echocardiogram at that time showed severe aortic stenosis with a mean gradient of 40 mmHg and a peak gradient of 64 mmHg.  Aortic valve area was 0.9 cm with a dimensionless index of 0.31.  Left ventricular ejection fraction was 60 to 65%.  She was referred to Dr. Clifton James and was set up for cardiac catheterization but when she came in for cath she was found to have altered mental status and was diagnosed with an acute stroke.  This was a cortical/subcortical infarct involving the left parietal lobe measuring 3 x 3 cm.  There was some developing encephalomalacia at the site and this infarct was felt to be subacute.  There were 2 acute/early subacute infarcts within the posterior left frontoparietal subcortical white matter.  Carotid Dopplers showed mild nonobstructive disease bilaterally.  She was discharged on aspirin and Plavix but did not start taking the Plavix and reported missing doses of her aspirin.  She was seen back in the cardiology office having chest discomfort and was admitted.  She underwent cardiac catheterization on  06/14/2020 showing mild nonobstructive coronary disease.  The mean gradient across the aortic valve was 28 mmHg.  There was some difficulty contacting her to complete her TAVR work-up but this was eventually completed in 11/2020.   She was evaluated by the multidisciplinary valve team and underwent successful TAVR with a 23 mm Edwards Sapien 3 Ultra R THV via the left subclavian approach on 11/28/20. Post operative echo showed EF 60%, normally functioning TAVR with a mean gradient of 10 mmHg and no PVL. She had hypertension and sinus tachycardia during admission and her Lopressor was increased from  to  BID. Given underlying RBBB and sinus tachy requiring an increase in her BB, a Zio AT was applied before discharge. She was discharged on aspirin alone.  Today the patient presents to clinic for follow up. She is her with one of her sons. She is doing well with a big improvement in her energy and breathing. She is anxious to get back to cleaning and household chores. No CP or SOB. No LE edema, orthopnea or PND. No dizziness or syncope. No blood in stool or urine. No palpitations. She also has quit smoking for over a week now.     Past Medical History:  Diagnosis Date   Anxiety    Aortic stenosis    Asthma    COPD (chronic obstructive pulmonary disease) (  HCC)    Coronary artery disease    CVA (cerebral vascular accident) (HCC)    Depression    Diabetes mellitus without complication (HCC)    Dizziness    Emphysema of lung (HCC)    Fatigue    Fever    Fibromyalgia    Hypertension    Hyperthyroidism 2018   Hypothyroidism    Mixed hyperlipidemia    Muscle pain    Rash    foot   S/P TAVR (transcatheter aortic valve replacement) 11/28/2020   s/p TAVR with a 23 mm Edwards S3UR via the subcalavian approach by Dr. Clifton James & Dr. Laneta Simmers   Sleep apnea 2012    Past Surgical History:  Procedure Laterality Date   CHOLECYSTECTOMY     RIGHT/LEFT HEART CATH AND CORONARY ANGIOGRAPHY N/A  06/14/2020   Procedure: RIGHT/LEFT HEART CATH AND CORONARY ANGIOGRAPHY;  Surgeon: Kathleene Hazel, MD;  Location: [facility] INVASIVE CV LAB;  Service: Cardiovascular;  Laterality: N/A;   TEE WITHOUT CARDIOVERSION  11/28/2020   Procedure: TRANSESOPHAGEAL ECHOCARDIOGRAM (TEE);  Surgeon: Kathleene Hazel, MD;  Location: [facility] INVASIVE CV LAB;  Service: Open Heart Surgery;;    Current Medications: Current Meds  Medication Sig   albuterol (VENTOLIN HFA) 108 (90 Base) MCG/ACT inhaler Inhale 1-2 puffs into the lungs every 6 (six) hours as needed for wheezing or shortness of breath.   amitriptyline (ELAVIL) 50 MG tablet Take 50 mg by mouth at bedtime.   amLODipine (NORVASC) 10 MG tablet TAKE 1 TABLET BY MOUTH ONCE DAILY   ARIPiprazole (ABILIFY) 10 MG tablet Take 10 mg by mouth daily.   aspirin EC 81 MG tablet Take 81 mg by mouth daily. Swallow whole.   baclofen (LIORESAL) 20 MG tablet Take 20 mg by mouth 3 (three) times daily as needed for muscle spasms.   Budeson-Glycopyrrol-Formoterol (BREZTRI AEROSPHERE) 160-9-4.8 MCG/ACT AERO Inhale 1 puff into the lungs in the morning and at bedtime.   desvenlafaxine (PRISTIQ) 50 MG 24 hr tablet Take 50 mg by mouth daily.   doxepin (SINEQUAN) 75 MG capsule Take 300 mg by mouth daily.   glimepiride (AMARYL) 4 MG tablet Take 4 mg by mouth daily with breakfast.   Insulin Glargine (BASAGLAR KWIKPEN) 100 UNIT/ML Inject 10 Units into the skin daily.   levothyroxine (SYNTHROID) 100 MCG tablet Take 100 mcg by mouth daily before breakfast.   meclizine (ANTIVERT) 25 MG tablet Take 25 mg by mouth every 12 (twelve) hours.   metoprolol tartrate (LOPRESSOR) 50 MG tablet Take 1 tablet (50 mg total) by mouth 2 (two) times daily.   nicotine (NICODERM CQ) 7 mg/24hr patch Place 1 patch (7 mg total) onto the skin daily.   nitroGLYCERIN (NITROSTAT) 0.4 MG SL tablet Place 0.4 mg under the tongue every 5 (five) minutes as needed for chest pain.   ondansetron (ZOFRAN-ODT) 8 MG  disintegrating tablet Take 8 mg by mouth every 8 (eight) hours as needed for nausea or vomiting.   oxyCODONE 30 MG 12 hr tablet Take 1 tablet by mouth every 12 (twelve) hours.   OZEMPIC, 0.25 OR 0.5 MG/DOSE, 2 MG/1.5ML SOPN Inject 0.5 mg into the skin every Monday.   pioglitazone (ACTOS) 45 MG tablet Take 45 mg by mouth daily.   pramipexole (MIRAPEX) 0.25 MG tablet Take 0.5 mg by mouth at bedtime.   rosuvastatin (CRESTOR) 40 MG tablet Take 40 mg by mouth daily.   SYNTHROID 25 MCG tablet Take 25 mcg by mouth daily before breakfast.     Allergies:  Codeine   Social History   Socioeconomic History   Marital status: Widowed    Spouse name: Not on file   Number of children: 4   Years of education: Not on file   Highest education level: Not on file  Occupational History   Occupation: On disability for 20 years  Tobacco Use   Smoking status: Every Day    Packs/day: 1.00    Years: 35.00    Pack years: 35.00    Types: Cigarettes   Smokeless tobacco: Never  Vaping Use   Vaping Use: Never used  Substance and Sexual Activity   Alcohol use: No   Drug use: No   Sexual activity: Not on file  Other Topics Concern   Not on file  Social History Narrative   Not on file   Social Determinants of Health   Financial Resource Strain: Not on file  Food Insecurity: Not on file  Transportation Needs: Not on file  Physical Activity: Not on file  Stress: Not on file  Social Connections: Not on file     Family History: The patient's family history includes Cancer in her father; Heart disease in her mother.  ROS:   Please see the history of present illness.    All other systems reviewed and are negative.  EKGs/Labs/Other Studies Reviewed:    The following studies were reviewed today:  TAVR OPERATIVE NOTE     Date of Procedure:                11/28/2020   Preoperative Diagnosis:      Severe Aortic Stenosis    Postoperative Diagnosis:    Same    Procedure:        Transcatheter  Aortic Valve Replacement - Left subclavian artery approach             Edwards Sapien 3 Ultra RSL THV (size 23 mm, model # 9755RSL, serial # [id number])              Co-Surgeons:                        Alleen Borne, MD and [name], MD     Anesthesiologist:                  Lavenia Atlas, DO   Echocardiographer:              Ruby Cola, MD   Pre-operative Echo Findings: Severe aortic stenosis Normal left ventricular systolic function   Post-operative Echo Findings: No paravalvular leak Normal left ventricular systolic function     _____________     Echo 11/29/20: IMPRESSIONS   1. Left ventricular ejection fraction, by estimation, is 60 to 65%. The  left ventricle has normal function. The left ventricle has no regional  wall motion abnormalities. There is moderate concentric left ventricular  hypertrophy. Left ventricular  diastolic parameters are consistent with Grade II diastolic dysfunction  (pseudonormalization). Elevated left atrial pressure.   2. Right ventricular systolic function is normal. The right ventricular  size is normal. There is normal pulmonary artery systolic pressure.   3. Left atrial size was mildly dilated.   4. The mitral valve is normal in structure. Mild mitral valve  regurgitation.   5. The aortic valve has been repaired/replaced. Aortic valve  regurgitation is not visualized. There is a 23 mm Edwards Sapien  prosthetic (TAVR) valve present in the aortic position. Procedure Date:  11/28/2020. Aortic valve  mean gradient measures 10.0  mmHg. Aortic valve Vmax measures 2.16 m/s. Aortic valve acceleration time measures 85 msec.   6. The inferior vena cava is normal in size with greater than 50%  respiratory variability, suggesting right atrial pressure of 3 mmHg.   EKG:  EKG is ordered today.  The ekg ordered today demonstrates sinus with RBBB HR 96  Recent Labs: 06/13/2020: TSH 22.202 11/24/2020: ALT 17 11/29/2020: BUN 13; Creatinine, Ser  0.72; Hemoglobin 14.7; Magnesium 1.7; Platelets 244; Potassium 3.4; Sodium 130  Recent Lipid Panel    Component Value Date/Time   CHOL 192 06/14/2020 0057   TRIG 140 06/14/2020 0057   HDL 57 06/14/2020 0057   CHOLHDL 3.4 06/14/2020 0057   VLDL 28 06/14/2020 0057   LDLCALC 107 (H) 06/14/2020 0057     Risk Assessment/Calculations:       Physical Exam:    VS:  BP 110/70 (BP Location: Left Arm, Patient Position: Sitting, Cuff Size: Normal)   Pulse 96   Ht 5\' 7"  (1.702 m)   Wt 168 lb (76.2 kg)   BMI 26.31 kg/m     Wt Readings from Last 3 Encounters:  12/06/20 168 lb (76.2 kg)  11/29/20 167 lb 3.2 oz (75.8 kg)  11/24/20 169 lb 12.8 oz (77 kg)     GEN:  Well nourished, well developed in no acute distress HEENT: Normal NECK: No JVD LYMPHATICS: No lymphadenopathy CARDIAC: RRR, no murmurs, rubs, gallops RESPIRATORY:  Clear to auscultation without rales, wheezing or rhonchi  ABDOMEN: Soft, non-tender, non-distended MUSCULOSKELETAL:  No edema; No deformity  SKIN: Warm and dry.  Groin/subclavian sites clear without hematoma or ecchymosis  NEUROLOGIC:  Alert and oriented x 3 PSYCHIATRIC:  Normal affect   ASSESSMENT:    1. S/P TAVR (transcatheter aortic valve replacement)   2. RBBB   3. Essential hypertension   4. History of CVA (cerebrovascular accident)   5. Tobacco abuse    PLAN:    In order of problems listed above:  Severe AS s/p TAVR: doing great 1 week out from TAVR. Groin/subclavian site are healing well. ECG with no HAVB. Continue on aspirin alone. SBE prophylaxis discussed; the patient is edentulous and does not go to the dentist. I will see her back on 12/7 for follow up and echo.   Underlying RBBB: stable. ECG with sinus today. Wearing Zio XT with no high risk alerts   HTN: Bp under great control today with increased Lopressor.    Hx of CVA: continue aspirin and statin.   Tobacco abuse: she has been quit x 7 days. Will call in nicotine patch rx for her.     Medication Adjustments/Labs and Tests Ordered: Current medicines are reviewed at length with the patient today.  Concerns regarding medicines are outlined above.  Orders Placed This Encounter  Procedures   EKG 12-Lead   Meds ordered this encounter  Medications   nicotine (NICODERM CQ) 7 mg/24hr patch    Sig: Place 1 patch (7 mg total) onto the skin daily.    Dispense:  28 patch    Refill:  3    Patient Instructions  Medication Instructions:  Your physician has recommended you make the following change in your medication:  START:  7 mg nicotine patch to skin daily   *If you need a refill on your cardiac medications before your next appointment, please call your pharmacy*   Lab Work: NONE If you have labs (blood work) drawn today and your tests are completely  normal, you will receive your results only by: MyChart Message (if you have MyChart) OR A paper copy in the mail If you have any lab test that is abnormal or we need to change your treatment, we will call you to review the results.   Testing/Procedures: NONE   Follow-Up: As scheduled  At [facility], you and your health needs are our priority.  As part of our continuing mission to provide you with exceptional heart care, we have created designated Provider Care Teams.  These Care Teams include your primary Cardiologist (physician) and Advanced Practice Providers (APPs -  Physician Assistants and Nurse Practitioners) who all work together to provide you with the care you need, when you need it.  We recommend signing up for the patient portal called "MyChart".  Sign up information is provided on this After Visit Summary.  MyChart is used to connect with patients for Virtual Visits (Telemedicine).  Patients are able to view lab/test results, encounter notes, upcoming appointments, etc.  Non-urgent messages can be sent to your provider as well.   To learn more about what you can do with MyChart, go to  [email or web address].      Signed, Cline Crock, PA-C  12/06/2020 2:52 PM    [facility]

## 2020-12-06 ENCOUNTER — Ambulatory Visit (INDEPENDENT_AMBULATORY_CARE_PROVIDER_SITE_OTHER): Payer: Medicare Other | Admitting: Physician Assistant

## 2020-12-06 ENCOUNTER — Encounter: Payer: Self-pay | Admitting: Physician Assistant

## 2020-12-06 ENCOUNTER — Other Ambulatory Visit: Payer: Self-pay

## 2020-12-06 VITALS — BP 110/70 | HR 96 | Ht 67.0 in | Wt 168.0 lb

## 2020-12-06 DIAGNOSIS — Z720 Tobacco use: Secondary | ICD-10-CM

## 2020-12-06 DIAGNOSIS — Z8673 Personal history of transient ischemic attack (TIA), and cerebral infarction without residual deficits: Secondary | ICD-10-CM | POA: Diagnosis not present

## 2020-12-06 DIAGNOSIS — I10 Essential (primary) hypertension: Secondary | ICD-10-CM

## 2020-12-06 DIAGNOSIS — F17211 Nicotine dependence, cigarettes, in remission: Secondary | ICD-10-CM

## 2020-12-06 DIAGNOSIS — Z952 Presence of prosthetic heart valve: Secondary | ICD-10-CM | POA: Diagnosis not present

## 2020-12-06 DIAGNOSIS — I4510 Unspecified right bundle-branch block: Secondary | ICD-10-CM | POA: Diagnosis not present

## 2020-12-06 MED ORDER — NICOTINE 7 MG/24HR TD PT24: 7.0000 mg | MEDICATED_PATCH | Freq: Every day | TRANSDERMAL | 3 refills | Status: DC

## 2020-12-06 NOTE — Patient Instructions (Signed)
Medication Instructions:  Your physician has recommended you make the following change in your medication:  START:  7 mg nicotine patch to skin daily   *If you need a refill on your cardiac medications before your next appointment, please call your pharmacy*   Lab Work: NONE If you have labs (blood work) drawn today and your tests are completely normal, you will receive your results only by: MyChart Message (if you have MyChart) OR A paper copy in the mail If you have any lab test that is abnormal or we need to change your treatment, we will call you to review the results.   Testing/Procedures: NONE   Follow-Up: As scheduled  At [facility], you and your health needs are our priority.  As part of our continuing mission to provide you with exceptional heart care, we have created designated Provider Care Teams.  These Care Teams include your primary Cardiologist (physician) and Advanced Practice Providers (APPs -  Physician Assistants and Nurse Practitioners) who all work together to provide you with the care you need, when you need it.  We recommend signing up for the patient portal called "MyChart".  Sign up information is provided on this After Visit Summary.  MyChart is used to connect with patients for Virtual Visits (Telemedicine).  Patients are able to view lab/test results, encounter notes, upcoming appointments, etc.  Non-urgent messages can be sent to your provider as well.   To learn more about what you can do with MyChart, go to [email or web address].

## 2020-12-22 ENCOUNTER — Other Ambulatory Visit: Payer: Self-pay | Admitting: Cardiovascular Disease

## 2020-12-25 ENCOUNTER — Ambulatory Visit: Payer: Medicare Other | Admitting: Cardiology

## 2020-12-27 ENCOUNTER — Telehealth (HOSPITAL_COMMUNITY): Payer: Self-pay | Admitting: Cardiology

## 2020-12-27 ENCOUNTER — Ambulatory Visit: Payer: Medicare Other | Admitting: Physician Assistant

## 2020-12-27 ENCOUNTER — Other Ambulatory Visit (HOSPITAL_COMMUNITY): Payer: Medicare Other

## 2020-12-27 NOTE — Telephone Encounter (Signed)
Patient called and cancelled Echocardiogram for reason below:  12/26/2020 3:37 PM ZO:XWRUEA, SYLVIA M  Cancel Rsn: Patient (pt is sick, will reschedule later)  Order will be removed from the active echo wq and when patient calls back we will reinstate the order. Thank you

## 2021-01-02 NOTE — Addendum Note (Signed)
Addended by: Janetta Hora on: 01/02/2021 01:31 PM   Modules accepted: Orders

## 2021-01-03 ENCOUNTER — Other Ambulatory Visit: Payer: Medicare Other

## 2021-01-18 ENCOUNTER — Other Ambulatory Visit: Payer: Medicare Other

## 2021-01-30 ENCOUNTER — Other Ambulatory Visit: Payer: Medicare Other

## 2021-02-13 ENCOUNTER — Telehealth: Payer: Self-pay | Admitting: Physician Assistant

## 2021-02-13 ENCOUNTER — Ambulatory Visit (INDEPENDENT_AMBULATORY_CARE_PROVIDER_SITE_OTHER): Payer: Commercial Managed Care - HMO

## 2021-02-13 ENCOUNTER — Other Ambulatory Visit: Payer: Self-pay

## 2021-02-13 DIAGNOSIS — Z952 Presence of prosthetic heart valve: Secondary | ICD-10-CM

## 2021-02-13 LAB — ECHOCARDIOGRAM COMPLETE
AR max vel: 1.02 cm2
AV Area VTI: 1.1 cm2
AV Area mean vel: 1.19 cm2
AV Mean grad: 16 mmHg
AV Peak grad: 36.2 mmHg
Ao pk vel: 3.01 m/s
Area-P 1/2: 4.96 cm2
MV VTI: 1.67 cm2
S' Lateral: 3.6 cm

## 2021-02-13 NOTE — Telephone Encounter (Signed)
HEART AND VASCULAR CENTER   MULTIDISCIPLINARY HEART VALVE TEAM  We have not been able to get the patient to come into the office and it is very difficult to get a hold of her by telephone. Echo today showed EF 60%, normally functioning TAVR with a mean gradient of 16 mmHg and no PVL (report copied below). Discussed results with son today. He was also able to help me fill out a KCCQ (copied below). She has NYHA class I symptoms. Her biggest limitation is an orthopedic ankle issue.  [facility] Cardiomyopathy Questionnaire  KCCQ-12 02/13/2021 12/06/2020 10/02/2020  1 a. Ability to shower/bathe Not at all limited Not at all limited Moderately limited  1 b. Ability to walk 1 block Not at all limited Not at all limited Extremely limited  1 c. Ability to hurry/jog Other, Did not do Moderately limited Extremely limited  2. Edema feet/ankles/legs Never over the past 2 weeks Every morning Never over the past 2 weeks  3. Limited by fatigue Never over the past 2 weeks At least once a day Several times a day  4. Limited by dyspnea Never over the past 2 weeks Never over the past 2 weeks Several times a day  5. Sitting up / on 3+ pillows Never over the past 2 weeks 3+ times a week, not every day Never over the past 2 weeks  6. Limited enjoyment of life Not limited at all Slightly limited Limited quite a bit  7. Rest of life w/ symptoms Completely satisfied Completely satisfied Mostly dissatisfied  8 a. Participation in hobbies Did not limit at all Did not limit at all Limited quite a bit  8 b. Participation in chores Did not limit at all Did not limit at all Limited quite a bit  8 c. Visiting family/friends Did not limit at all Did not limit at all Moderately limited     ____________________    ECHOCARDIOGRAM REPORT         Patient Name:   Kristina Montgomery Date of Exam: 02/13/2021  Medical Rec #:  [id number]       Height:       67.0 in  Accession #:    [id number]      Weight:       168.0 lb  Date of  Birth:  1955/06/14       BSA:          1.878 m  Patient Age:    65 years        BP:           110/70 mmHg  Patient Gender: F               HR:           90 bpm.  Exam Location:  [place]   Procedure: 2D Echo, Cardiac Doppler, Color Doppler and Strain Analysis   Indications:    S/P TAVR (transcatheter aortic valve replacement) [Z56.3                  (ICD-10-CM)]     History:        Patient has prior history of Echocardiogram examinations,  most                  recent 11/29/2020. Aortic Valve Disease; Risk  Factors:Former                  Smoker and Dyslipidemia.  Aortic Valve: valve is present in the aortic position.     Sonographer:    Louie BostonJames Reel RDCS  Referring Phys: [id number] Wille CelesteKATHRYN R Tan Clopper   IMPRESSIONS     1. Left ventricular ejection fraction, by estimation, is 60 to 65%. The  left ventricle has normal function. The left ventricle has no regional  wall motion abnormalities. There is mild left ventricular hypertrophy.  Left ventricular diastolic parameters  are consistent with Grade I diastolic dysfunction (impaired relaxation).   2. Right ventricular systolic function is normal. The right ventricular  size is normal. There is normal pulmonary artery systolic pressure.   3. Left atrial size was moderately dilated.   4. The mitral valve is normal in structure. No evidence of mitral valve  regurgitation. No evidence of mitral stenosis.   5. The aortic valve was not well visualized. Aortic valve regurgitation  is not visualized. There is a prosthetic TAVR valve present in the aortic  position. Aortic valve mean gradient measures 16.0 mmHg. Aortic valve Vmax  measures 3.01 m/s.   6. The inferior vena cava is normal in size with greater than 50%  respiratory variability, suggesting right atrial pressure of 3 mmHg.   FINDINGS   Left Ventricle: Left ventricular ejection fraction, by estimation, is 60  to 65%. The left ventricle has normal function. The left  ventricle has no  regional wall motion abnormalities. The left ventricular internal cavity  size was normal in size. There is   mild left ventricular hypertrophy. Left ventricular diastolic parameters  are consistent with Grade I diastolic dysfunction (impaired relaxation).   Right Ventricle: The right ventricular size is normal. No increase in  right ventricular wall thickness. Right ventricular systolic function is  normal. There is normal pulmonary artery systolic pressure. The tricuspid  regurgitant velocity is 1.18 m/s, and   with an assumed right atrial pressure of 3 mmHg, the estimated right  ventricular systolic pressure is 8.6 mmHg.   Left Atrium: Left atrial size was moderately dilated.   Right Atrium: Right atrial size was normal in size.   Pericardium: There is no evidence of pericardial effusion.   Mitral Valve: The mitral valve is normal in structure. No evidence of  mitral valve regurgitation. No evidence of mitral valve stenosis. MV peak  gradient, 13.5 mmHg. The mean mitral valve gradient is 6.0 mmHg.   Tricuspid Valve: The tricuspid valve is normal in structure. Tricuspid  valve regurgitation is not demonstrated. No evidence of tricuspid  stenosis.   Aortic Valve: The aortic valve was not well visualized. Aortic valve  regurgitation is not visualized. No aortic stenosis is present. Aortic  valve mean gradient measures 16.0 mmHg. Aortic valve peak gradient  measures 36.2 mmHg. Aortic valve area, by VTI  measures 1.10 cm. There is a valve present in the aortic position.   Pulmonic Valve: The pulmonic valve was normal in structure. Pulmonic valve  regurgitation is not visualized. No evidence of pulmonic stenosis.   Aorta: The aortic root is normal in size and structure.   Venous: The inferior vena cava is normal in size with greater than 50%  respiratory variability, suggesting right atrial pressure of 3 mmHg.   IAS/Shunts: No atrial level shunt detected by  color flow Doppler.      LEFT VENTRICLE  PLAX 2D  LVIDd:         5.20 cm   Diastology  LVIDs:         3.60 cm   LV e' medial:  4.79 cm/s  LV PW:         1.30 cm   LV E/e' medial:  28.2  LV IVS:        1.30 cm   LV e' lateral:   4.35 cm/s  LVOT diam:     1.90 cm   LV E/e' lateral: 31.0  LV SV:         56  LV SV Index:   30  LVOT Area:     2.84 cm      RIGHT VENTRICLE             IVC  RV S prime:     10.10 cm/s  IVC diam: 1.80 cm  TAPSE (M-mode): 2.3 cm   LEFT ATRIUM             Index        RIGHT ATRIUM           Index  LA diam:        4.20 cm 2.24 cm/m   RA Area:     10.90 cm  LA Vol (A2C):   81.2 ml 43.24 ml/m  RA Volume:   21.50 ml  11.45 ml/m  LA Vol (A4C):   65.6 ml 34.93 ml/m  LA Biplane Vol: 76.2 ml 40.58 ml/m   AORTIC VALVE  AV Area (Vmax):    1.02 cm  AV Area (Vmean):   1.19 cm  AV Area (VTI):     1.10 cm  AV Vmax:           301.00 cm/s  AV Vmean:          182.000 cm/s  AV VTI:            0.512 m  AV Peak Grad:      36.2 mmHg  AV Mean Grad:      16.0 mmHg  LVOT Vmax:         108.00 cm/s  LVOT Vmean:        76.300 cm/s  LVOT VTI:          0.199 m  LVOT/AV VTI ratio: 0.39     AORTA  Ao Root diam: 2.80 cm  Ao Asc diam:  2.40 cm  Ao Desc diam: 2.50 cm   MITRAL VALVE                TRICUSPID VALVE  MV Area (PHT): 4.96 cm     TR Peak grad:   5.6 mmHg  MV Area VTI:   1.67 cm     TR Vmax:        118.00 cm/s  MV Peak grad:  13.5 mmHg  MV Mean grad:  6.0 mmHg     SHUNTS  MV Vmax:       1.84 m/s     Systemic VTI:  0.20 m  MV Vmean:      115.5 cm/s   Systemic Diam: 1.90 cm  MV Decel Time: 153 msec  MV E velocity: 135.00 cm/s  MV A velocity: 177.00 cm/s  MV E/A ratio:  0.76   Belva Crome MD  Electronically signed by Belva Crome MD  Signature Date/Time: 02/13/2021/10:12:50 AM      _____________________________     Cline Crock PA-C  [facility]  Pager [phone]

## 2021-06-26 ENCOUNTER — Encounter: Payer: Self-pay | Admitting: Physician Assistant

## 2021-08-15 ENCOUNTER — Other Ambulatory Visit: Payer: Self-pay | Admitting: Physician Assistant

## 2021-08-15 DIAGNOSIS — Z952 Presence of prosthetic heart valve: Secondary | ICD-10-CM

## 2021-08-15 IMAGING — CR DG CHEST 2V
2 series · 2 of 2 positions shown · non-contrast
Comparison: Chest x-ray 06/01/2020.

CLINICAL DATA: Chest pain.

EXAM:
CHEST - 2 VIEW

[chest pa]
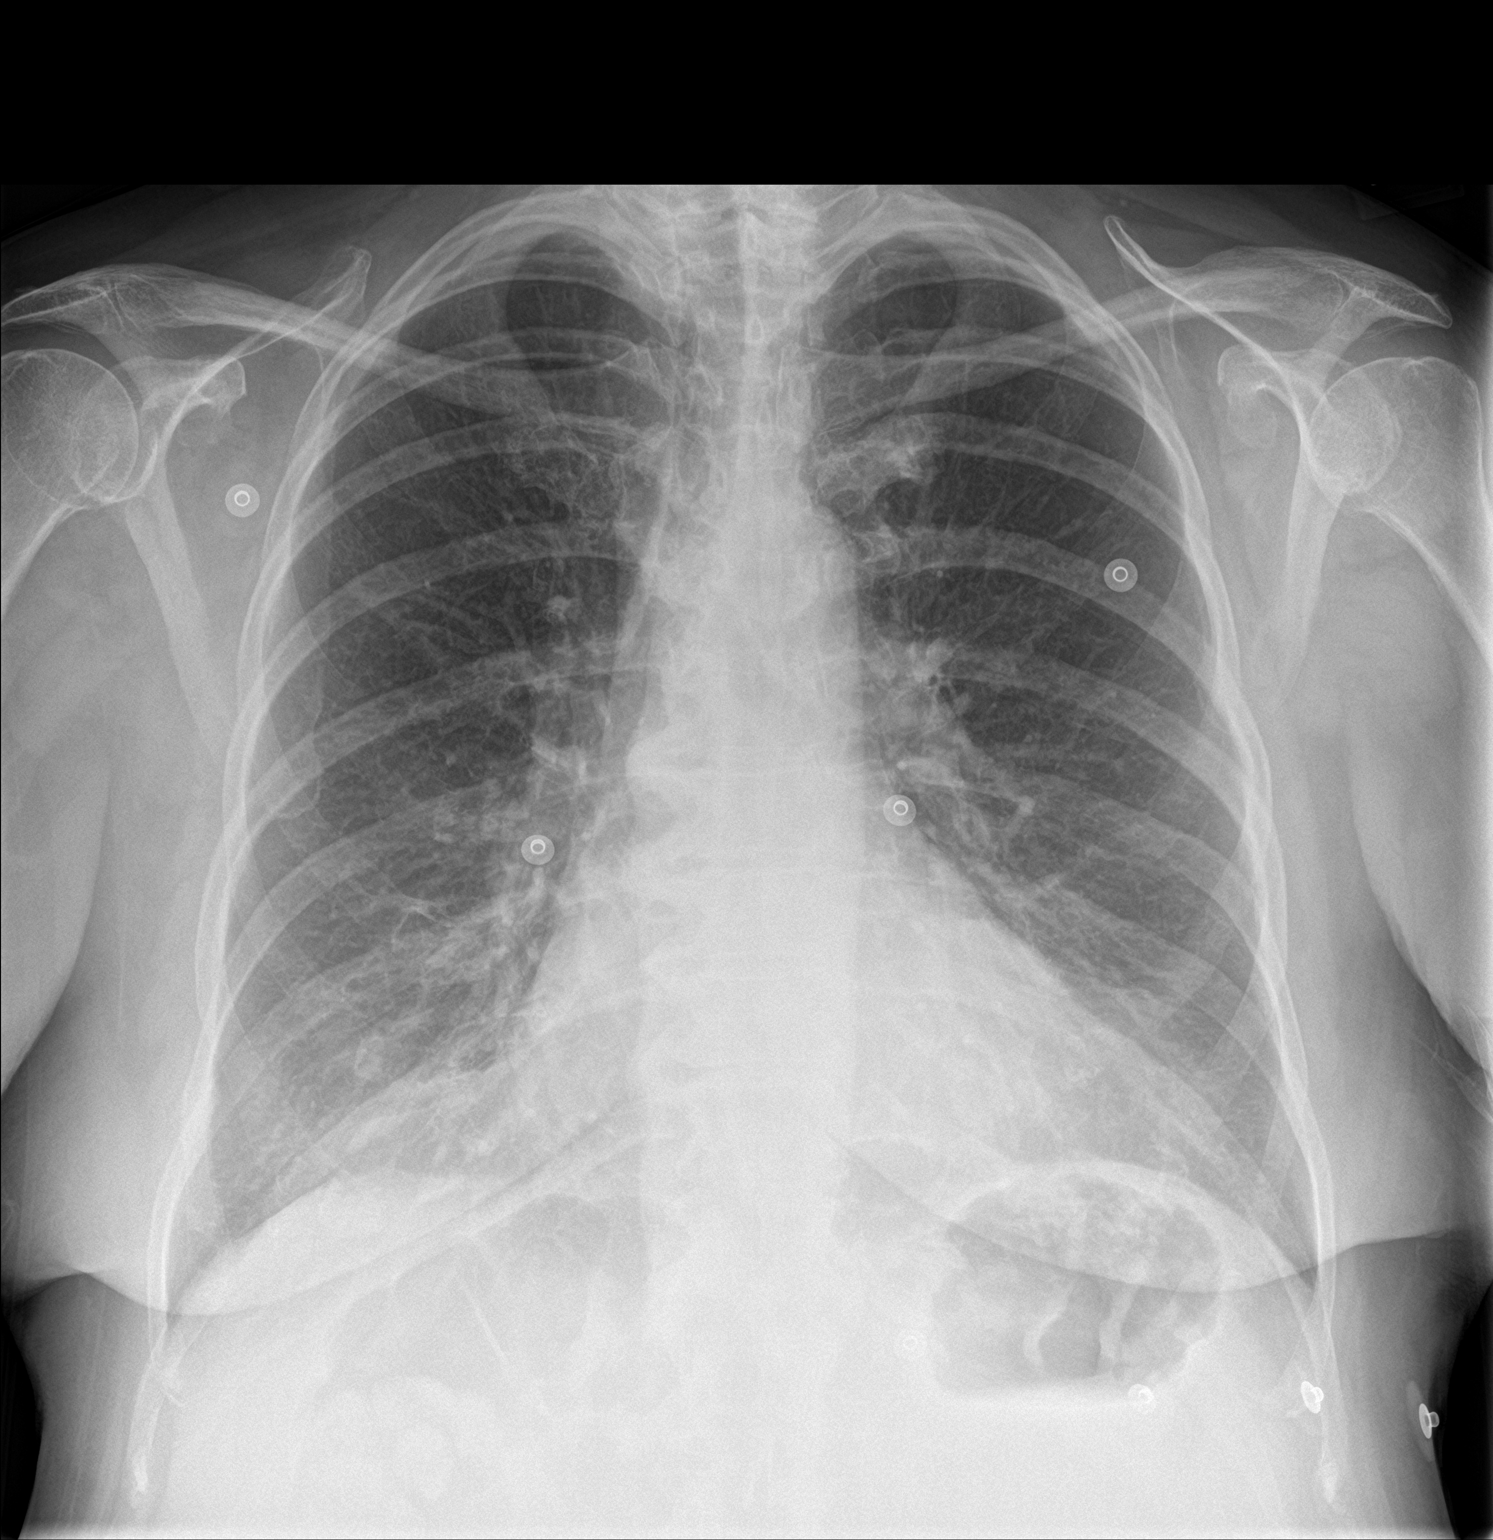

[chest lat]
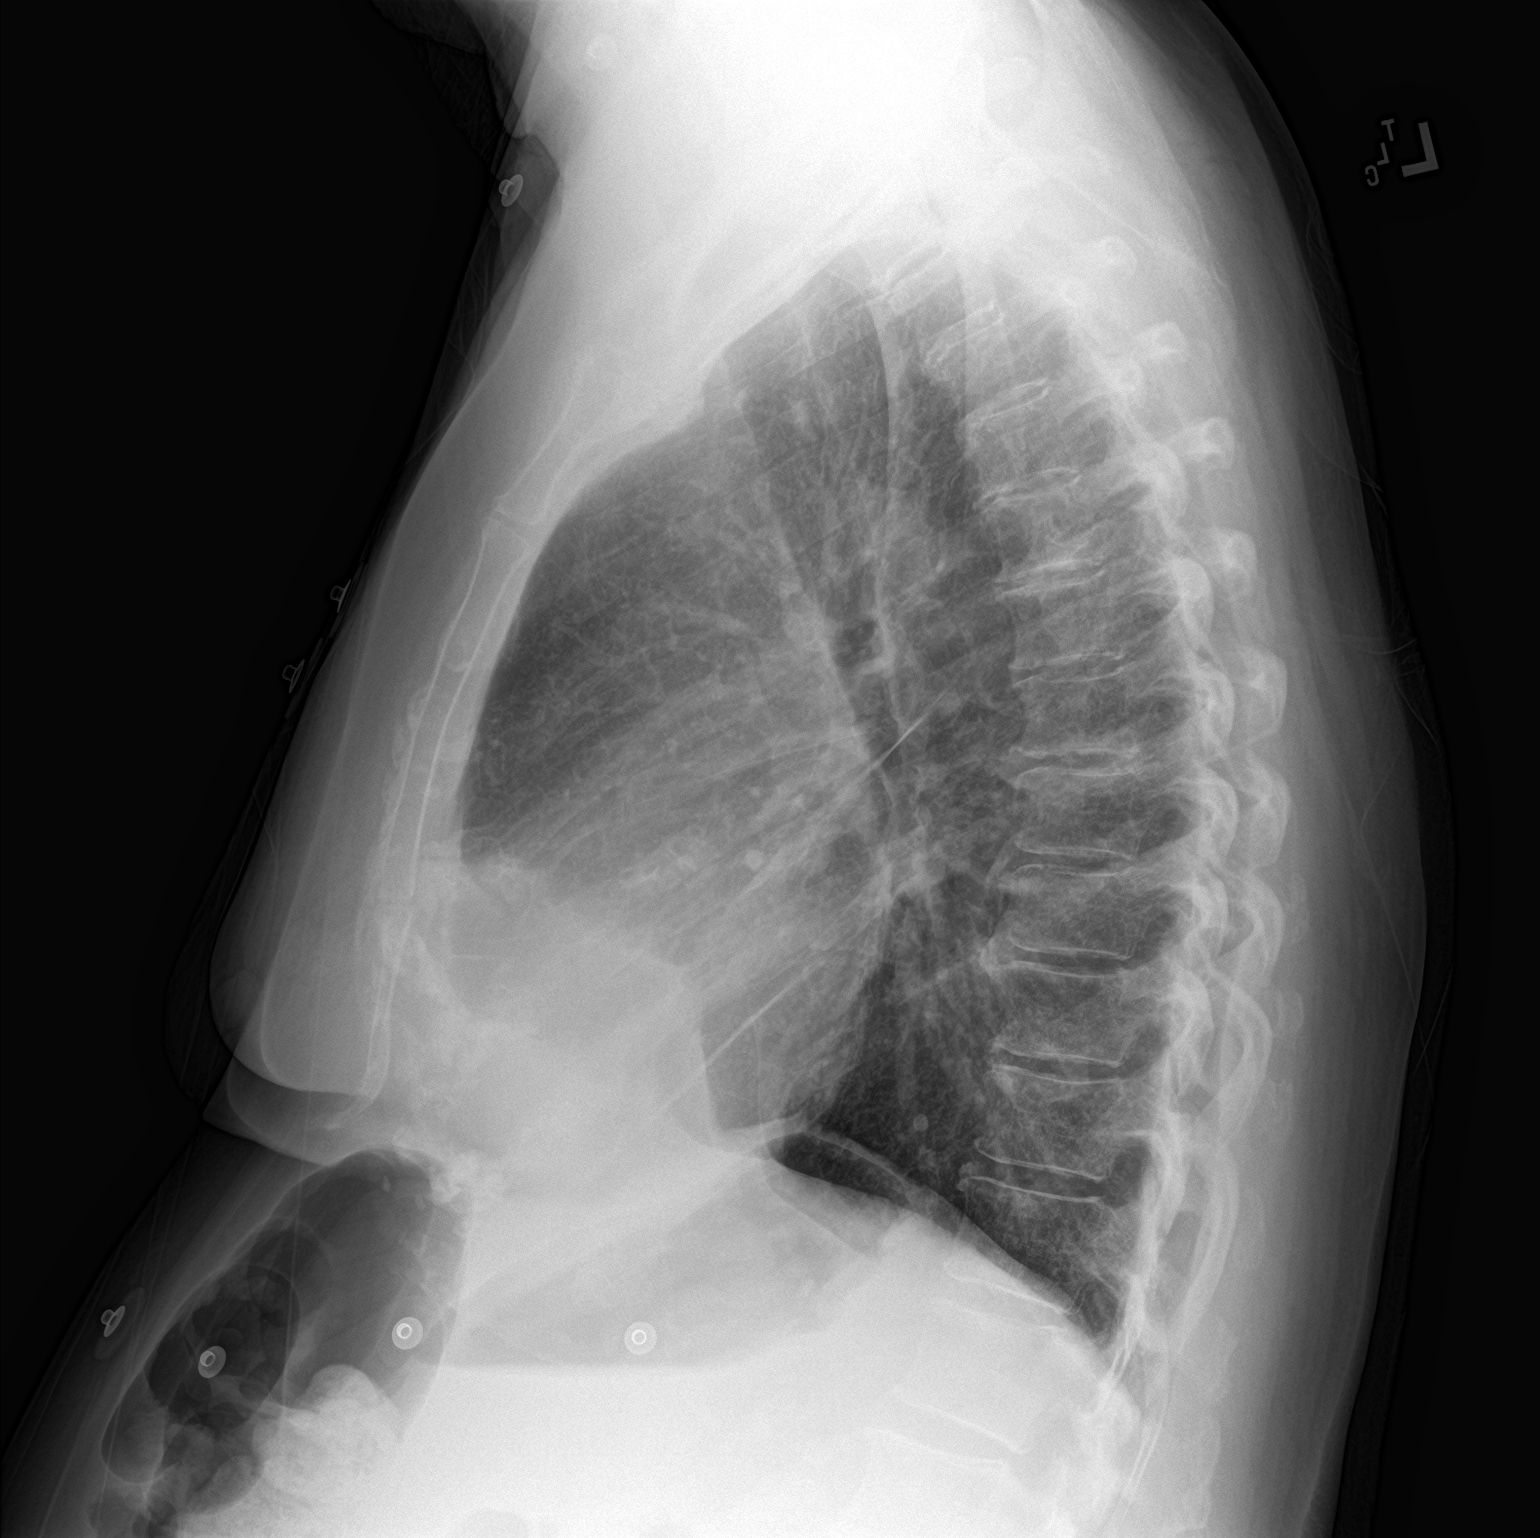

[2 of 2 positions shown; findings below may reference images not displayed]

FINDINGS: Mediastinum hilar structures normal. Cardiomegaly. No pulmonary
venous congestion. Low lung volumes with mild bibasilar
atelectasis/infiltrates. No pleural effusion or pneumothorax.
Degenerative change thoracic spine.
IMPRESSION: 1.  Cardiomegaly.  No pulmonary venous congestion.

2.  Low lung volumes with mild bibasilar atelectasis/infiltrates.

## 2021-12-03 ENCOUNTER — Telehealth: Payer: Self-pay | Admitting: Physician Assistant

## 2021-12-03 NOTE — Telephone Encounter (Signed)
  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 She does not answer her phone and has been non complaint with follow up. She is due for an echo and OV but does not return our phone calls. I tried to reach out to son, who was also unavailable. She recently saw her PCP in November per review of careeverywhere, although I cannot read the details of the notes. Will forward to [facility] for registry purposes.   Cline Crock PA-C  [facility]

## 2022-01-26 IMAGING — CR DG CHEST 2V
2 series · 2 of 2 positions shown · non-contrast
Comparison: June 13, 2020.

CLINICAL DATA: History of aortic stenosis, preoperative evaluation
in a 65-year-old.

EXAM:
CHEST - 2 VIEW

[w chest pa]
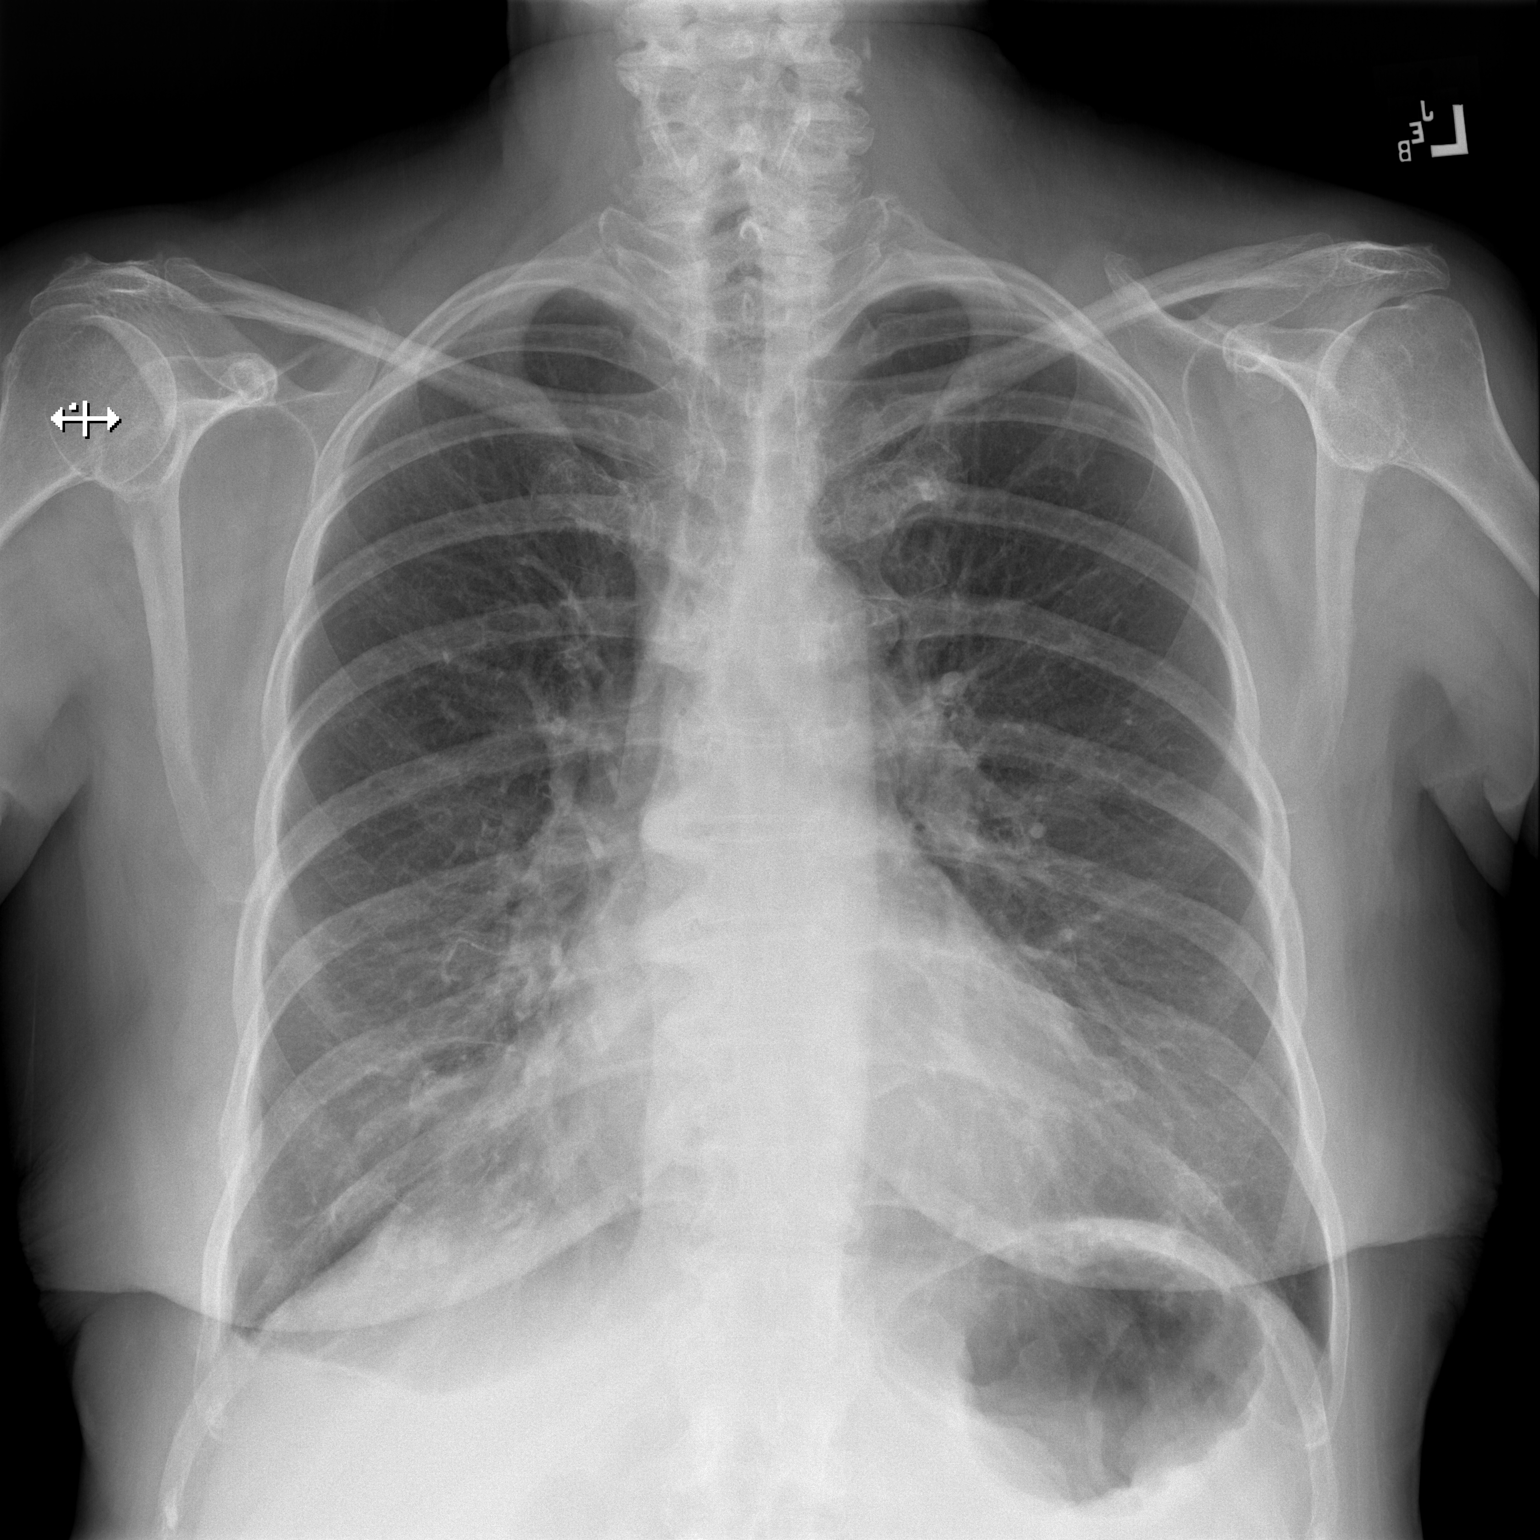

[w chest lat]
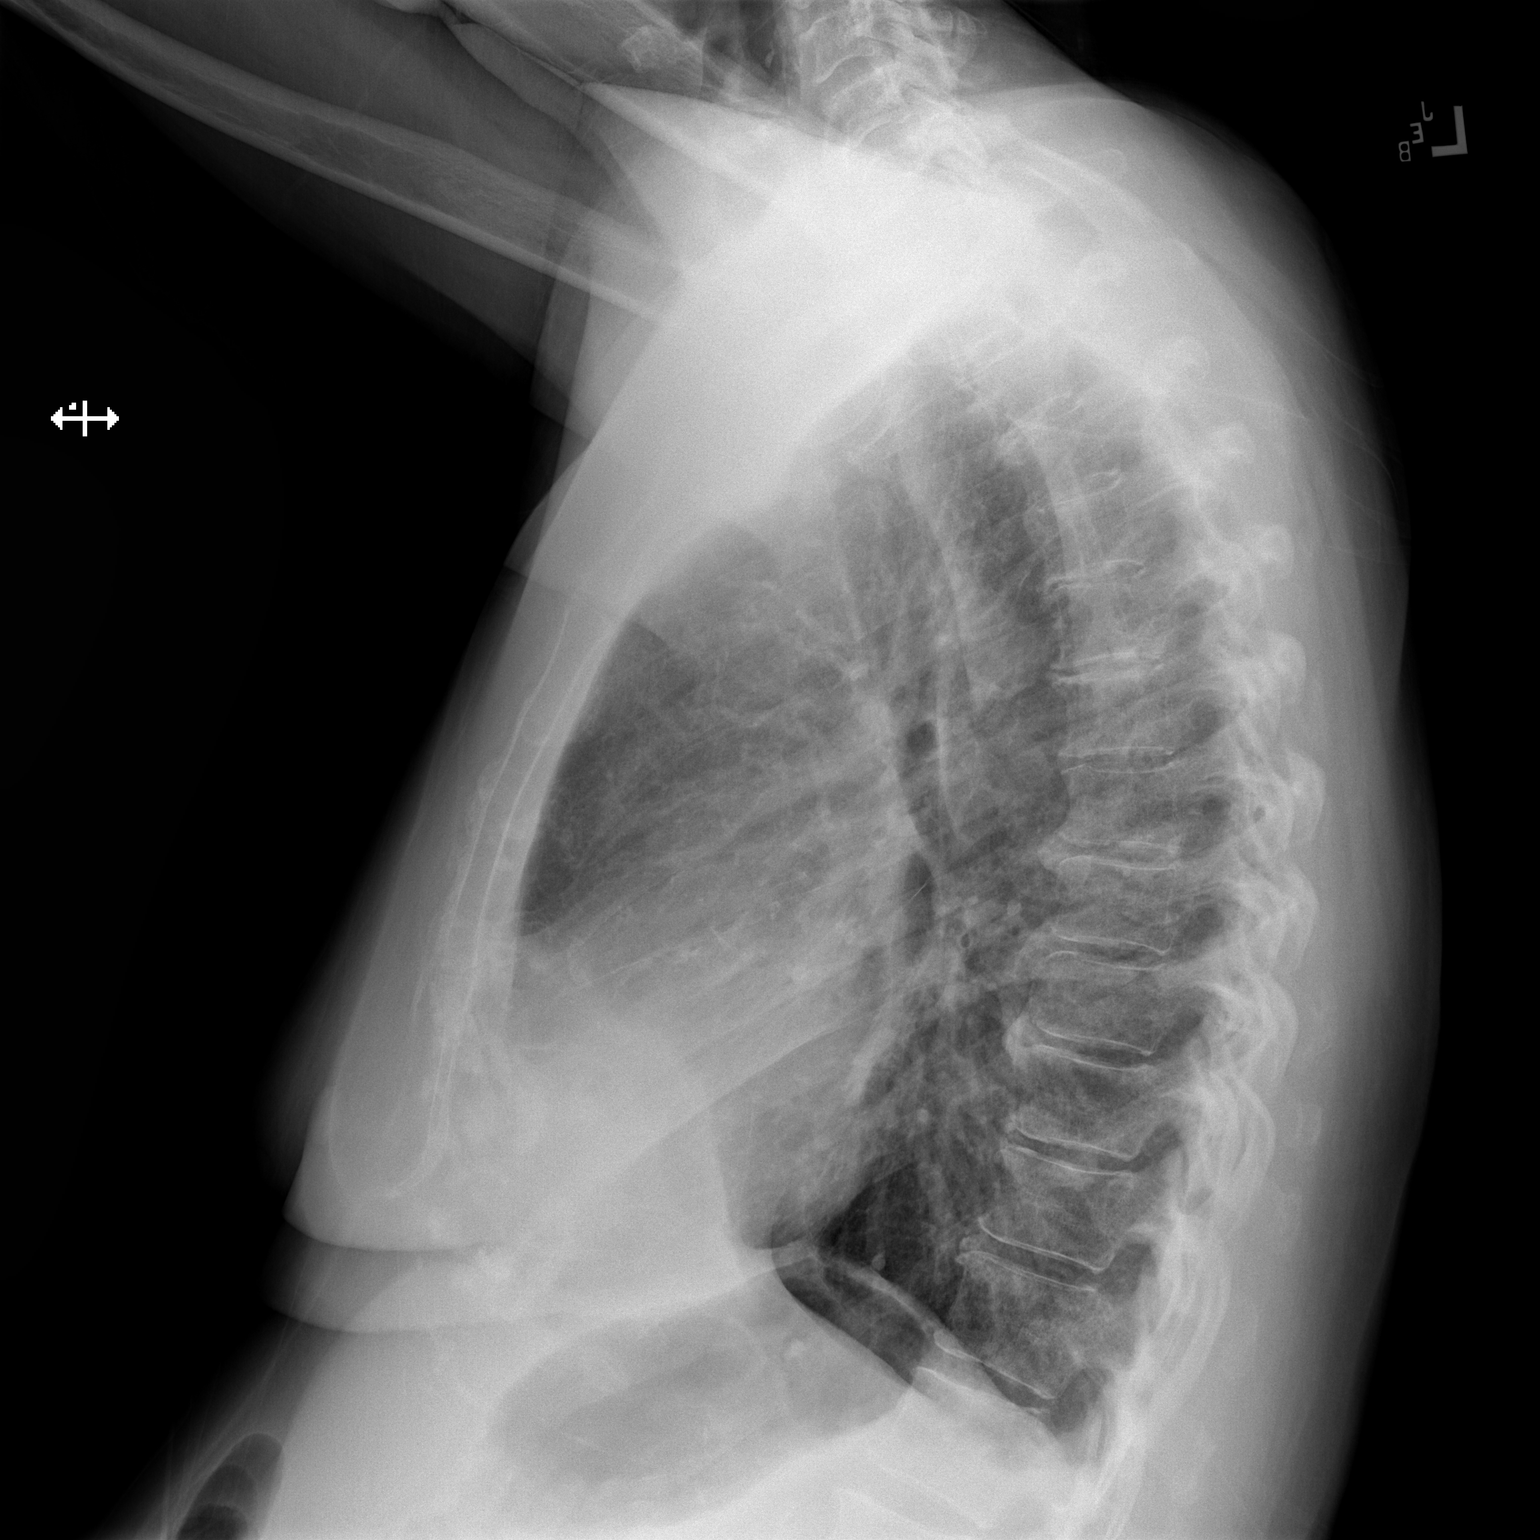

[2 of 2 positions shown; findings below may reference images not displayed]

FINDINGS: The heart size and mediastinal contours are within normal limits.
Both lungs are clear. The visualized skeletal structures are
unremarkable.
IMPRESSION: No active cardiopulmonary disease.

## 2022-02-26 ENCOUNTER — Other Ambulatory Visit: Payer: Self-pay | Admitting: Cardiovascular Disease

## 2022-06-19 ENCOUNTER — Other Ambulatory Visit: Payer: Self-pay | Admitting: Physician Assistant

## 2022-07-08 ENCOUNTER — Other Ambulatory Visit: Payer: Self-pay | Admitting: Physician Assistant

## 2022-07-26 ENCOUNTER — Other Ambulatory Visit: Payer: Self-pay | Admitting: Physician Assistant

## 2022-08-16 ENCOUNTER — Other Ambulatory Visit: Payer: Self-pay | Admitting: Physician Assistant

## 2023-01-24 DIAGNOSIS — I4510 Unspecified right bundle-branch block: Secondary | ICD-10-CM | POA: Diagnosis not present

## 2023-06-22 DEATH — deceased
